# Patient Record
Sex: Female | Born: 1955 | Race: White | Hispanic: No | Smoking: Former smoker
Health system: Southern US, Community
[De-identification: ages and names within clinical notes are randomized; demographics above are authoritative.]

## PROBLEM LIST (undated history)

## (undated) DIAGNOSIS — C801 Malignant (primary) neoplasm, unspecified: Secondary | ICD-10-CM

## (undated) DIAGNOSIS — R000 Tachycardia, unspecified: Secondary | ICD-10-CM

## (undated) DIAGNOSIS — F32A Depression, unspecified: Secondary | ICD-10-CM

## (undated) DIAGNOSIS — F329 Major depressive disorder, single episode, unspecified: Secondary | ICD-10-CM

## (undated) DIAGNOSIS — G629 Polyneuropathy, unspecified: Secondary | ICD-10-CM

## (undated) DIAGNOSIS — K7460 Unspecified cirrhosis of liver: Secondary | ICD-10-CM

## (undated) DIAGNOSIS — I10 Essential (primary) hypertension: Secondary | ICD-10-CM

## (undated) DIAGNOSIS — I2510 Atherosclerotic heart disease of native coronary artery without angina pectoris: Secondary | ICD-10-CM

## (undated) DIAGNOSIS — D649 Anemia, unspecified: Secondary | ICD-10-CM

## (undated) DIAGNOSIS — E079 Disorder of thyroid, unspecified: Secondary | ICD-10-CM

## (undated) DIAGNOSIS — E119 Type 2 diabetes mellitus without complications: Secondary | ICD-10-CM

## (undated) DIAGNOSIS — E039 Hypothyroidism, unspecified: Secondary | ICD-10-CM

## (undated) HISTORY — PX: APPENDECTOMY: SHX54

## (undated) HISTORY — PX: ABDOMINAL HYSTERECTOMY: SHX81

---

## 2001-08-14 ENCOUNTER — Ambulatory Visit (HOSPITAL_COMMUNITY): Admission: RE | Admit: 2001-08-14 | Discharge: 2001-08-14 | Payer: Self-pay | Admitting: Internal Medicine

## 2001-10-26 ENCOUNTER — Encounter: Payer: Self-pay | Admitting: Obstetrics & Gynecology

## 2001-10-26 ENCOUNTER — Inpatient Hospital Stay (HOSPITAL_COMMUNITY): Admission: AD | Admit: 2001-10-26 | Discharge: 2001-10-31 | Payer: Self-pay | Admitting: Obstetrics & Gynecology

## 2002-02-19 ENCOUNTER — Encounter: Payer: Self-pay | Admitting: Emergency Medicine

## 2002-02-19 ENCOUNTER — Emergency Department (HOSPITAL_COMMUNITY): Admission: EM | Admit: 2002-02-19 | Discharge: 2002-02-19 | Payer: Self-pay | Admitting: Emergency Medicine

## 2002-07-12 ENCOUNTER — Ambulatory Visit (HOSPITAL_COMMUNITY): Admission: RE | Admit: 2002-07-12 | Discharge: 2002-07-12 | Payer: Self-pay | Admitting: Family Medicine

## 2002-07-12 ENCOUNTER — Encounter: Payer: Self-pay | Admitting: Family Medicine

## 2002-07-14 ENCOUNTER — Emergency Department (HOSPITAL_COMMUNITY): Admission: EM | Admit: 2002-07-14 | Discharge: 2002-07-14 | Payer: Self-pay | Admitting: Emergency Medicine

## 2002-07-14 ENCOUNTER — Encounter: Payer: Self-pay | Admitting: Emergency Medicine

## 2002-08-30 ENCOUNTER — Encounter: Payer: Self-pay | Admitting: Family Medicine

## 2002-08-30 ENCOUNTER — Ambulatory Visit (HOSPITAL_COMMUNITY): Admission: RE | Admit: 2002-08-30 | Discharge: 2002-08-30 | Payer: Self-pay | Admitting: Family Medicine

## 2002-10-28 ENCOUNTER — Encounter: Payer: Self-pay | Admitting: Family Medicine

## 2002-10-28 ENCOUNTER — Ambulatory Visit (HOSPITAL_COMMUNITY): Admission: RE | Admit: 2002-10-28 | Discharge: 2002-10-28 | Payer: Self-pay | Admitting: Family Medicine

## 2002-12-27 ENCOUNTER — Ambulatory Visit (HOSPITAL_COMMUNITY): Admission: RE | Admit: 2002-12-27 | Discharge: 2002-12-27 | Payer: Self-pay | Admitting: Family Medicine

## 2002-12-27 ENCOUNTER — Encounter: Payer: Self-pay | Admitting: Family Medicine

## 2003-02-12 ENCOUNTER — Emergency Department (HOSPITAL_COMMUNITY): Admission: EM | Admit: 2003-02-12 | Discharge: 2003-02-12 | Payer: Self-pay | Admitting: Emergency Medicine

## 2003-03-16 ENCOUNTER — Emergency Department (HOSPITAL_COMMUNITY): Admission: EM | Admit: 2003-03-16 | Discharge: 2003-03-16 | Payer: Self-pay | Admitting: Emergency Medicine

## 2003-07-12 ENCOUNTER — Ambulatory Visit (HOSPITAL_COMMUNITY): Admission: RE | Admit: 2003-07-12 | Discharge: 2003-07-12 | Payer: Self-pay | Admitting: Family Medicine

## 2003-07-15 ENCOUNTER — Ambulatory Visit (HOSPITAL_COMMUNITY): Admission: RE | Admit: 2003-07-15 | Discharge: 2003-07-15 | Payer: Self-pay | Admitting: Family Medicine

## 2004-06-21 ENCOUNTER — Ambulatory Visit: Payer: Self-pay | Admitting: Family Medicine

## 2004-11-07 ENCOUNTER — Ambulatory Visit: Payer: Self-pay | Admitting: Family Medicine

## 2004-11-13 ENCOUNTER — Ambulatory Visit (HOSPITAL_COMMUNITY): Admission: RE | Admit: 2004-11-13 | Discharge: 2004-11-13 | Payer: Self-pay | Admitting: Family Medicine

## 2005-01-17 ENCOUNTER — Ambulatory Visit (HOSPITAL_COMMUNITY): Admission: RE | Admit: 2005-01-17 | Discharge: 2005-01-17 | Payer: Self-pay | Admitting: Family Medicine

## 2005-01-23 ENCOUNTER — Ambulatory Visit: Payer: Self-pay | Admitting: Family Medicine

## 2005-01-30 ENCOUNTER — Ambulatory Visit (HOSPITAL_COMMUNITY): Admission: RE | Admit: 2005-01-30 | Discharge: 2005-01-30 | Payer: Self-pay | Admitting: Family Medicine

## 2005-03-28 ENCOUNTER — Ambulatory Visit (HOSPITAL_COMMUNITY): Admission: RE | Admit: 2005-03-28 | Discharge: 2005-03-28 | Payer: Self-pay | Admitting: Family Medicine

## 2005-04-16 ENCOUNTER — Ambulatory Visit: Payer: Self-pay | Admitting: Family Medicine

## 2005-06-03 ENCOUNTER — Ambulatory Visit: Payer: Self-pay | Admitting: Internal Medicine

## 2006-04-14 ENCOUNTER — Ambulatory Visit: Payer: Self-pay | Admitting: Internal Medicine

## 2006-05-14 ENCOUNTER — Encounter (INDEPENDENT_AMBULATORY_CARE_PROVIDER_SITE_OTHER): Payer: Self-pay | Admitting: Specialist

## 2006-05-14 ENCOUNTER — Ambulatory Visit (HOSPITAL_COMMUNITY): Admission: RE | Admit: 2006-05-14 | Discharge: 2006-05-14 | Payer: Self-pay | Admitting: Internal Medicine

## 2006-05-28 ENCOUNTER — Ambulatory Visit: Payer: Self-pay | Admitting: Internal Medicine

## 2006-06-11 ENCOUNTER — Emergency Department (HOSPITAL_COMMUNITY): Admission: EM | Admit: 2006-06-11 | Discharge: 2006-06-11 | Payer: Self-pay | Admitting: Emergency Medicine

## 2006-08-28 ENCOUNTER — Ambulatory Visit: Payer: Self-pay | Admitting: Internal Medicine

## 2006-10-07 ENCOUNTER — Ambulatory Visit (HOSPITAL_COMMUNITY): Admission: RE | Admit: 2006-10-07 | Discharge: 2006-10-07 | Payer: Self-pay | Admitting: Internal Medicine

## 2006-10-07 ENCOUNTER — Ambulatory Visit: Payer: Self-pay | Admitting: Internal Medicine

## 2007-10-23 ENCOUNTER — Emergency Department (HOSPITAL_COMMUNITY): Admission: EM | Admit: 2007-10-23 | Discharge: 2007-10-23 | Payer: Self-pay | Admitting: Emergency Medicine

## 2007-10-25 ENCOUNTER — Emergency Department (HOSPITAL_COMMUNITY): Admission: EM | Admit: 2007-10-25 | Discharge: 2007-10-25 | Payer: Self-pay | Admitting: Emergency Medicine

## 2008-04-09 ENCOUNTER — Emergency Department (HOSPITAL_COMMUNITY): Admission: EM | Admit: 2008-04-09 | Discharge: 2008-04-09 | Payer: Self-pay | Admitting: Emergency Medicine

## 2008-04-13 ENCOUNTER — Ambulatory Visit (HOSPITAL_COMMUNITY): Admission: RE | Admit: 2008-04-13 | Discharge: 2008-04-13 | Payer: Self-pay | Admitting: General Surgery

## 2008-05-18 ENCOUNTER — Ambulatory Visit (HOSPITAL_COMMUNITY): Admission: RE | Admit: 2008-05-18 | Discharge: 2008-05-18 | Payer: Self-pay | Admitting: Internal Medicine

## 2008-05-24 ENCOUNTER — Encounter (HOSPITAL_COMMUNITY): Admission: RE | Admit: 2008-05-24 | Discharge: 2008-06-23 | Payer: Self-pay | Admitting: Oncology

## 2008-05-31 ENCOUNTER — Ambulatory Visit (HOSPITAL_COMMUNITY): Payer: Self-pay | Admitting: Physician Assistant

## 2008-05-31 ENCOUNTER — Encounter (HOSPITAL_COMMUNITY): Admission: RE | Admit: 2008-05-31 | Discharge: 2008-06-30 | Payer: Self-pay | Admitting: Physician Assistant

## 2008-06-21 ENCOUNTER — Ambulatory Visit (HOSPITAL_COMMUNITY): Payer: Self-pay | Admitting: Oncology

## 2008-06-27 ENCOUNTER — Inpatient Hospital Stay (HOSPITAL_COMMUNITY): Admission: EM | Admit: 2008-06-27 | Discharge: 2008-06-29 | Payer: Self-pay | Admitting: Emergency Medicine

## 2008-06-28 ENCOUNTER — Ambulatory Visit: Payer: Self-pay | Admitting: Oncology

## 2008-06-29 ENCOUNTER — Ambulatory Visit: Payer: Self-pay | Admitting: Internal Medicine

## 2008-07-07 ENCOUNTER — Ambulatory Visit (HOSPITAL_COMMUNITY): Admission: RE | Admit: 2008-07-07 | Discharge: 2008-07-07 | Payer: Self-pay | Admitting: Internal Medicine

## 2008-07-25 ENCOUNTER — Ambulatory Visit (HOSPITAL_COMMUNITY): Admission: RE | Admit: 2008-07-25 | Discharge: 2008-07-25 | Payer: Self-pay | Admitting: General Surgery

## 2008-08-15 ENCOUNTER — Ambulatory Visit (HOSPITAL_COMMUNITY): Payer: Self-pay | Admitting: Oncology

## 2008-08-16 ENCOUNTER — Encounter (HOSPITAL_COMMUNITY): Admission: RE | Admit: 2008-08-16 | Discharge: 2008-09-15 | Payer: Self-pay | Admitting: Oncology

## 2008-10-26 ENCOUNTER — Encounter: Payer: Self-pay | Admitting: Internal Medicine

## 2009-05-08 ENCOUNTER — Encounter: Payer: Self-pay | Admitting: Internal Medicine

## 2009-06-20 ENCOUNTER — Encounter: Payer: Self-pay | Admitting: Family Medicine

## 2009-06-23 IMAGING — CT CT PELVIS W/ CM
1 of 3 series · 13 of 32 positions shown, 18 images · IV contrast (agent unspecified)
Comparison: 01/30/2005.

CT ABDOMEN

CLINICAL DATA: 52-year-old female with cirrhosis, tachycardia,
anemia.  History of cervical cancer, hysterectomy and appendectomy.

CT ABDOMEN AND PELVIS WITH CONTRAST
TECHNIQUE: Multidetector CT imaging of the abdomen and pelvis was
performed using the standard protocol following bolus
administration of intravenous contrast.
Contrast: 100 ml Imnipaque-SHH.

[Series 2: abd_pel 5.0 b40f · axial · 0.89mm/px · z∈[-496,-80]mm · 13 of 95 slices shown, 18 images]
[im 6/95  soft-tissue]
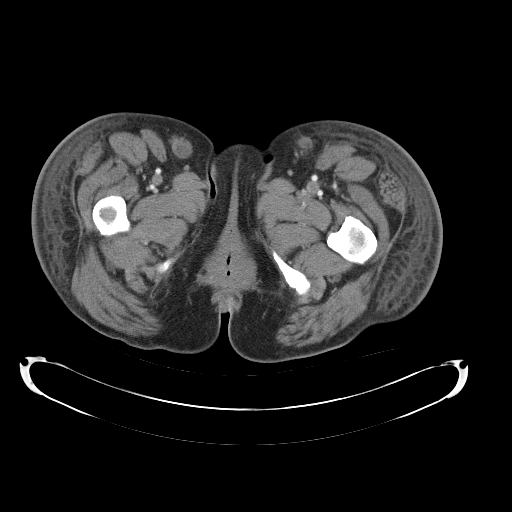
[im 6/95  bone]
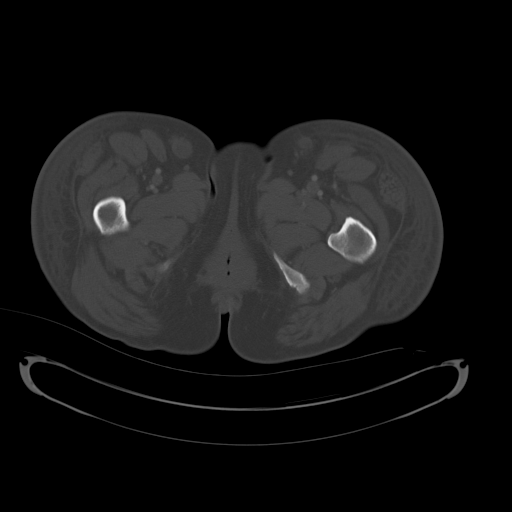
[im 17/95  soft-tissue]
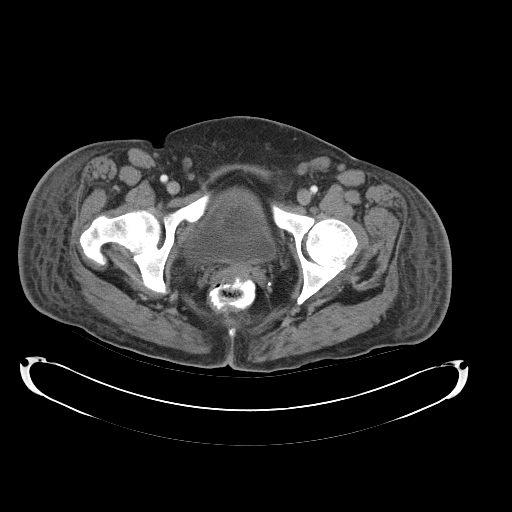
[im 23/95  soft-tissue]
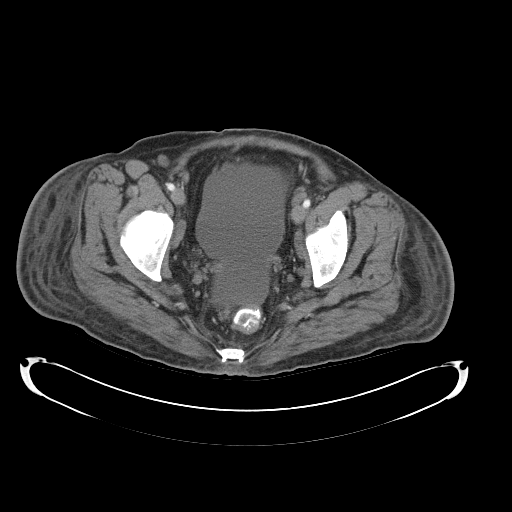
[im 28/95  soft-tissue]
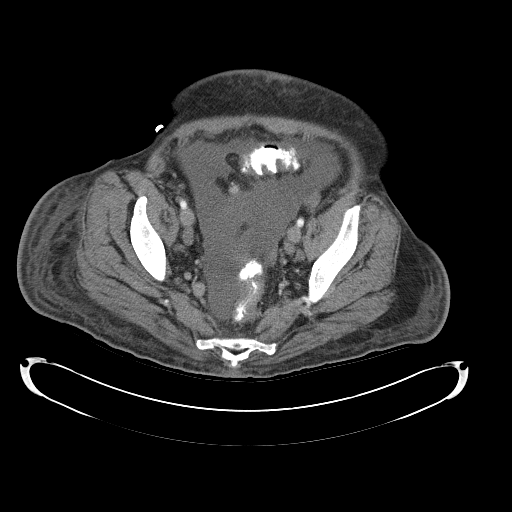
[im 39/95  soft-tissue]
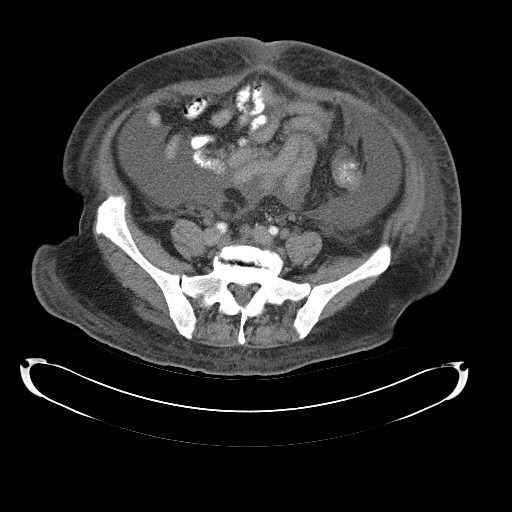
[im 45/95  soft-tissue]
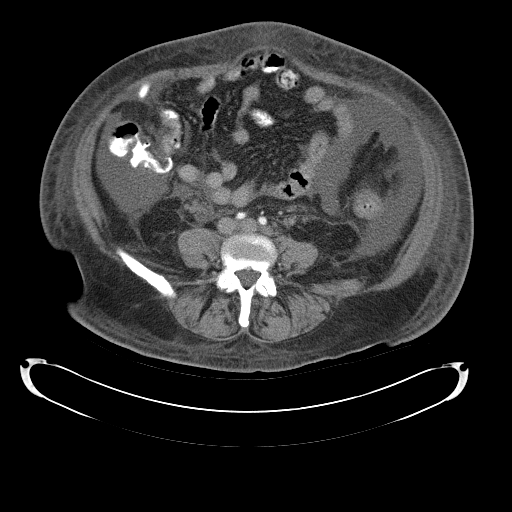
[im 50/95  soft-tissue]
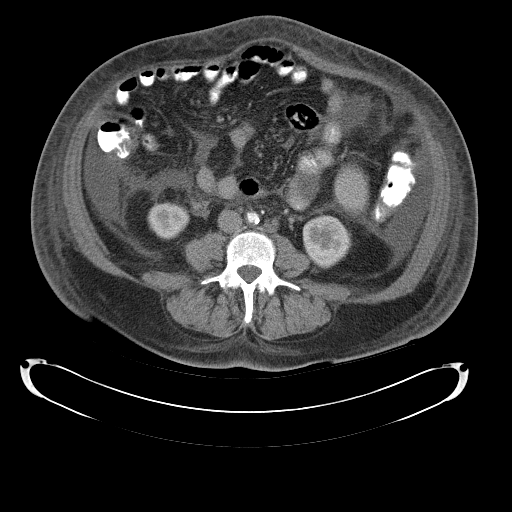
[im 61/95  soft-tissue]
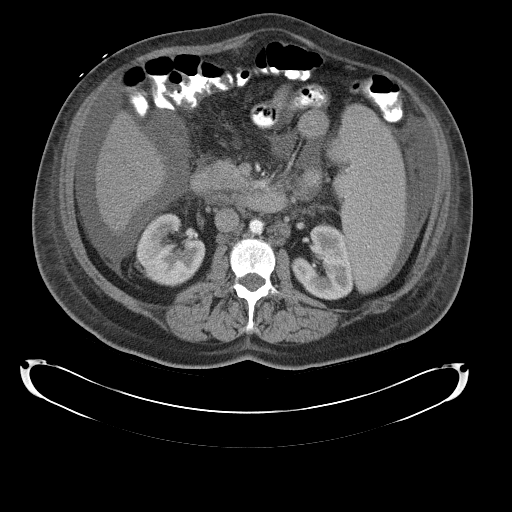
[im 67/95  soft-tissue]
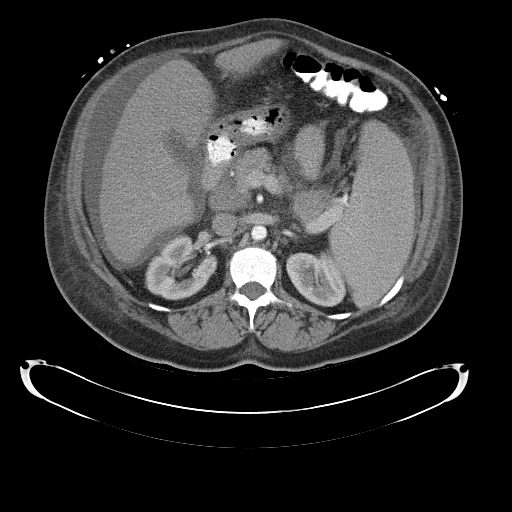
[im 67/95  bone]
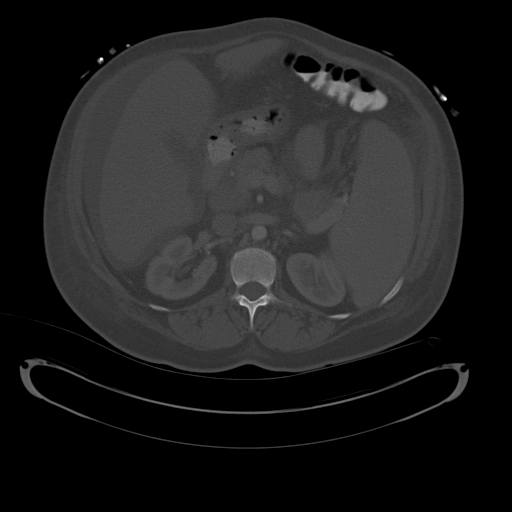
[im 72/95  soft-tissue]
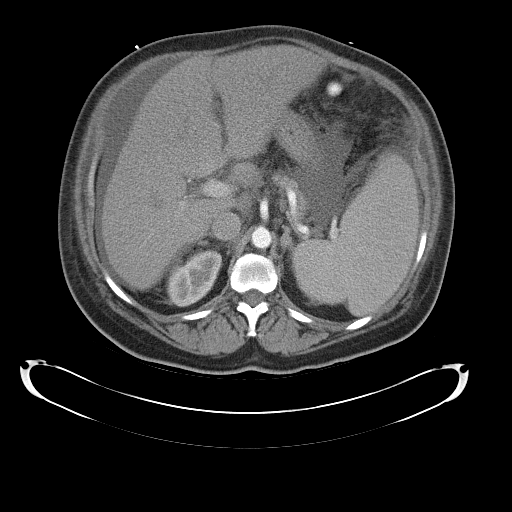
[im 72/95  lung]
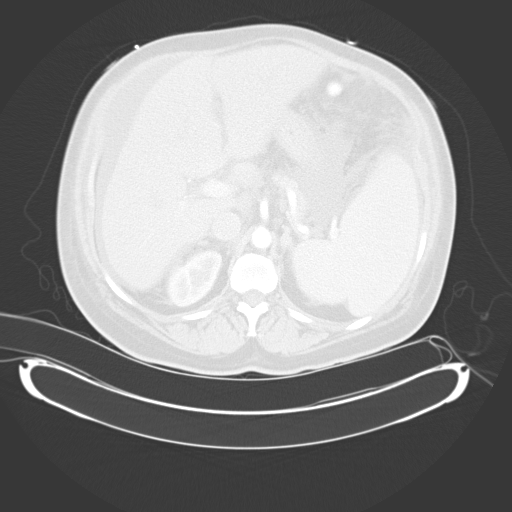
[im 78/95  lung]
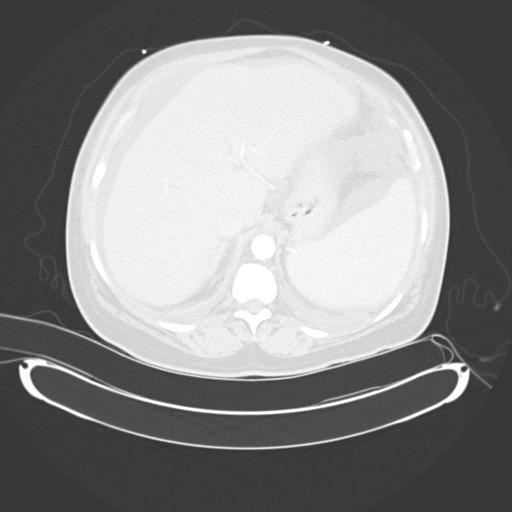
[im 83/95  soft-tissue]
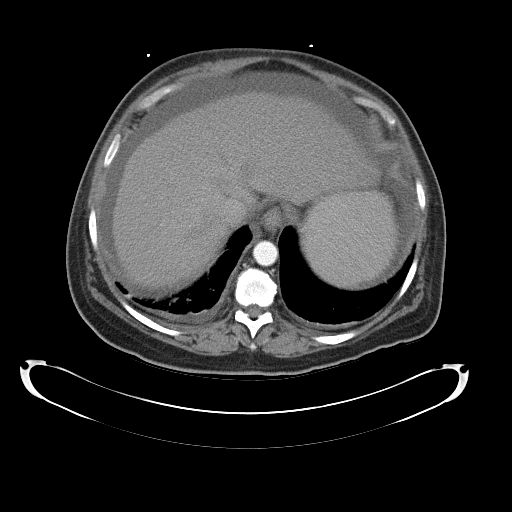
[im 83/95  lung]
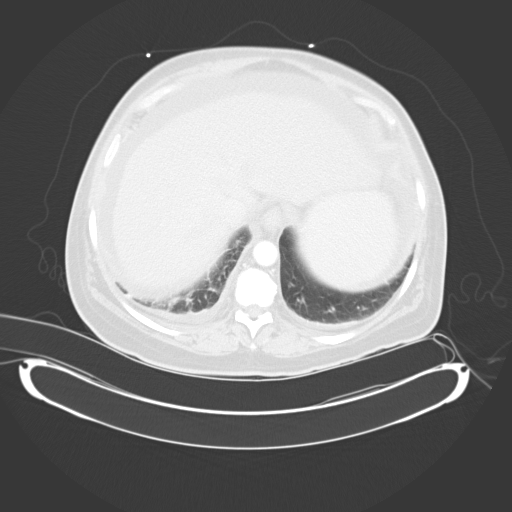
[im 89/95  soft-tissue]
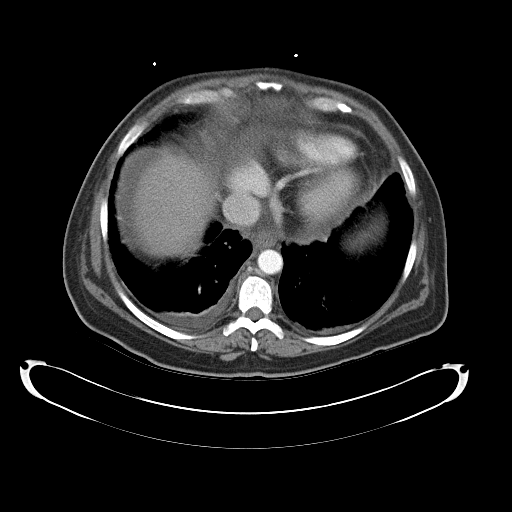
[im 89/95  lung]
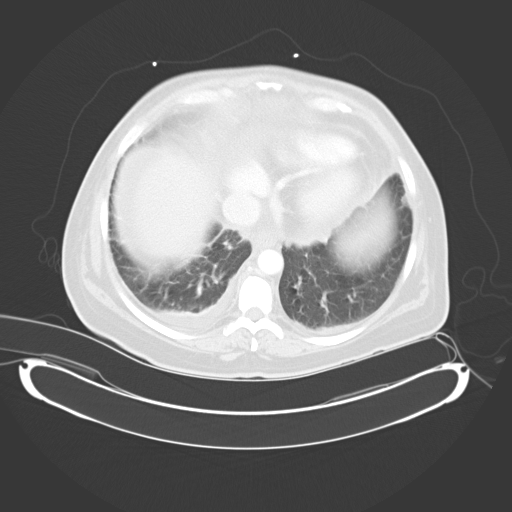

[13 of 32 positions shown; findings below may reference images not displayed]

FINDINGS: Small layering right pleural effusion and trace layering
left pleural effusion.  Small pericardial effusion is new measuring
up to 12 mm in thickness.  Atelectasis at the lung bases.  Lumbar
disc degeneration, worst at L5-S1. No acute osseous abnormality
identified.

Moderate volume ascites, chiefly in the lower abdomen and pelvis
with a smaller perihepatic and perisplenic fluid.  Mild periportal
edema.  No discrete liver parenchymal lesion.  The gallbladder,
pancreas, adrenal glands and kidneys are within normal limits.
Splenomegaly measures 18 cm.  Main portal vein is patent.  Major
abdominal arterial structures are patent, there is infrarenal and
iliac atherosclerosis.  The stomach, duodenum and proximal small
bowel loops are within normal limits.  No dilated bowel, and no
bowel obstruction.  The colon is intermittently thick-walled,
primarily the distal colon.  Interval increased porta hepatis,
gastrohepatic ligament and retroperitoneal lymphadenopathy.  The
largest node is a portacaval node measuring 18 mm in short axis.
Prominent superior diaphragmatic nodes also increased measuring up
to 8 mm in short axis.  Adenopathy continues in the pelvis, see
below.
IMPRESSION: 1.  Stigmata of cirrhosis and portal venous hypertension with
interval increased significantly increased ascites.  New small (12
mm in thickness) pericardial effusion.  Small pleural effusions.
Splenomegaly.
2.  Increased abdominal and retroperitoneal lymphadenopathy is
nonspecific and can be seen in the setting of chronic hepatitis,
metastatic disease, or lymphoproliferative disorder.  The largest
nodes are in the inguinal regions (see below).
3.  See pelvis findings below.

CT PELVIS
FINDINGS: Moderate volume pelvic ascites.  Bladder is
decompressed.  Uterus and adnexa are surgically absent.  The distal
colon is thick-walled which could be reactive or reflect colitis.
Major pelvic arterial structures are patent. No acute osseous
abnormality identified.

There is retroperitoneal, pelvic sidewall and inguinal
lymphadenopathy.  The largest nodes are in the inguinal nodes
measuring 18-21 cm bilaterally (the largest is in the left inferior
inguinal region.  Subcutaneous fat stranding in the pelvis and
lower extremities is compatible with anasarca.
IMPRESSION: 1.  Pelvic and inguinal lymphadenopathy.  The largest node is a 2
cm inferior left inguinal node which would be amendable to
percutaneous biopsy as clinically indicated.
2.  Moderate volume pelvic ascites. Anasarca.
3.  Thick-walled distal colon could be reactive to the presence of
ascites or reflect colitis. Clinical correlation recommended.

## 2009-07-31 ENCOUNTER — Encounter: Payer: Self-pay | Admitting: Internal Medicine

## 2009-09-15 ENCOUNTER — Emergency Department (HOSPITAL_COMMUNITY): Admission: EM | Admit: 2009-09-15 | Discharge: 2009-09-15 | Payer: Self-pay | Admitting: Emergency Medicine

## 2009-12-12 ENCOUNTER — Encounter: Payer: Self-pay | Admitting: Internal Medicine

## 2010-05-07 ENCOUNTER — Encounter: Payer: Self-pay | Admitting: Internal Medicine

## 2010-07-21 ENCOUNTER — Encounter: Payer: Self-pay | Admitting: Family Medicine

## 2010-07-22 ENCOUNTER — Encounter: Payer: Self-pay | Admitting: Internal Medicine

## 2010-07-22 ENCOUNTER — Encounter: Payer: Self-pay | Admitting: Family Medicine

## 2010-07-31 NOTE — Letter (Signed)
Summary: External Other  External Other   Imported By: Peggyann Shoals 12/12/2009 10:35:30  _____________________________________________________________________  External Attachment:    Type:   Image     Comment:   External Document

## 2010-07-31 NOTE — Letter (Signed)
Summary: RECORDS FROM [facility]  RECORDS FROM [facility]   Imported By: Diana Eves 07/31/2009 16:48:32  _____________________________________________________________________  External Attachment:    Type:   Image     Comment:   External Document

## 2010-07-31 NOTE — Letter (Signed)
Summary: [facility]  [facility] NOTE   Imported By: Rexene Alberts 05/07/2010 10:44:03  _____________________________________________________________________  External Attachment:    Type:   Image     Comment:   External Document

## 2010-09-04 ENCOUNTER — Emergency Department (HOSPITAL_COMMUNITY): Payer: Managed Care, Other (non HMO)

## 2010-09-04 ENCOUNTER — Inpatient Hospital Stay (HOSPITAL_COMMUNITY)
Admission: EM | Admit: 2010-09-04 | Discharge: 2010-09-06 | DRG: 682 | Disposition: A | Discharge status: Home / Self Care | Payer: Managed Care, Other (non HMO) | Attending: Internal Medicine | Admitting: Internal Medicine

## 2010-09-04 DIAGNOSIS — E860 Dehydration: Secondary | ICD-10-CM | POA: Diagnosis present

## 2010-09-04 DIAGNOSIS — K7689 Other specified diseases of liver: Secondary | ICD-10-CM | POA: Diagnosis present

## 2010-09-04 DIAGNOSIS — N179 Acute kidney failure, unspecified: Principal | ICD-10-CM | POA: Diagnosis present

## 2010-09-04 DIAGNOSIS — E876 Hypokalemia: Secondary | ICD-10-CM | POA: Diagnosis present

## 2010-09-04 DIAGNOSIS — K7460 Unspecified cirrhosis of liver: Secondary | ICD-10-CM | POA: Diagnosis present

## 2010-09-04 DIAGNOSIS — M549 Dorsalgia, unspecified: Secondary | ICD-10-CM | POA: Diagnosis present

## 2010-09-04 DIAGNOSIS — J189 Pneumonia, unspecified organism: Secondary | ICD-10-CM | POA: Diagnosis present

## 2010-09-04 DIAGNOSIS — G8929 Other chronic pain: Secondary | ICD-10-CM | POA: Diagnosis present

## 2010-09-04 LAB — URINALYSIS, ROUTINE W REFLEX MICROSCOPIC
Bilirubin Urine: NEGATIVE
Glucose, UA: NEGATIVE mg/dL
Nitrite: NEGATIVE
Protein, ur: 100 mg/dL — AB
pH: 5 (ref 5.0–8.0)

## 2010-09-04 LAB — DIFFERENTIAL
Eosinophils Absolute: 0 10*3/uL (ref 0.0–0.7)
Eosinophils Relative: 0 % (ref 0–5)
Neutrophils Relative %: 92 % — ABNORMAL HIGH (ref 43–77)

## 2010-09-04 LAB — CBC
HCT: 25.8 % — ABNORMAL LOW (ref 36.0–46.0)
Hemoglobin: 9.4 g/dL — ABNORMAL LOW (ref 12.0–15.0)
MCH: 32.5 pg (ref 26.0–34.0)
MCHC: 36.4 g/dL — ABNORMAL HIGH (ref 30.0–36.0)
MCV: 89.3 fL (ref 78.0–100.0)
Platelets: 127 10*3/uL — ABNORMAL LOW (ref 150–400)
RBC: 2.89 MIL/uL — ABNORMAL LOW (ref 3.87–5.11)
RDW: 13.7 % (ref 11.5–15.5)

## 2010-09-04 LAB — PROTIME-INR: INR: 1.72 — ABNORMAL HIGH (ref 0.00–1.49)

## 2010-09-04 LAB — URINE MICROSCOPIC-ADD ON

## 2010-09-04 LAB — COMPREHENSIVE METABOLIC PANEL
ALT: 10 U/L (ref 0–35)
Alkaline Phosphatase: 100 U/L (ref 39–117)
BUN: 43 mg/dL — ABNORMAL HIGH (ref 6–23)
Calcium: 7.5 mg/dL — ABNORMAL LOW (ref 8.4–10.5)
Chloride: 97 mEq/L (ref 96–112)
Glucose, Bld: 228 mg/dL — ABNORMAL HIGH (ref 70–99)
Potassium: 2.9 mEq/L — ABNORMAL LOW (ref 3.5–5.1)
Total Bilirubin: 0.9 mg/dL (ref 0.3–1.2)

## 2010-09-04 LAB — APTT: aPTT: 46 seconds — ABNORMAL HIGH (ref 24–37)

## 2010-09-05 ENCOUNTER — Inpatient Hospital Stay (HOSPITAL_COMMUNITY): Payer: Managed Care, Other (non HMO)

## 2010-09-05 LAB — COMPREHENSIVE METABOLIC PANEL
ALT: 9 U/L (ref 0–35)
AST: 10 U/L (ref 0–37)
Alkaline Phosphatase: 83 U/L (ref 39–117)
BUN: 37 mg/dL — ABNORMAL HIGH (ref 6–23)
GFR calc Af Amer: 27 mL/min — ABNORMAL LOW (ref >60)
GFR calc non Af Amer: 22 mL/min — ABNORMAL LOW (ref >60)
Potassium: 3.2 mEq/L — ABNORMAL LOW (ref 3.5–5.1)

## 2010-09-05 LAB — DIFFERENTIAL
Basophils Absolute: 0 10*3/uL (ref 0.0–0.1)
Basophils Relative: 0 % (ref 0–1)
Eosinophils Relative: 0 % (ref 0–5)
Lymphocytes Relative: 8 % — ABNORMAL LOW (ref 12–46)
Monocytes Relative: 7 % (ref 3–12)
Neutrophils Relative %: 85 % — ABNORMAL HIGH (ref 43–77)

## 2010-09-05 LAB — URINE CULTURE
Colony Count: NO GROWTH
Culture  Setup Time: 201203070132
Culture: NO GROWTH

## 2010-09-05 LAB — MAGNESIUM: Magnesium: 0.9 mg/dL — CL (ref 1.5–2.5)

## 2010-09-05 LAB — CBC
HCT: 22.1 % — ABNORMAL LOW (ref 36.0–46.0)
MCHC: 36.7 g/dL — ABNORMAL HIGH (ref 30.0–36.0)
MCV: 89.8 fL (ref 78.0–100.0)
Platelets: 105 10*3/uL — ABNORMAL LOW (ref 150–400)
RBC: 2.46 MIL/uL — ABNORMAL LOW (ref 3.87–5.11)
WBC: 8.5 10*3/uL (ref 4.0–10.5)

## 2010-09-05 LAB — PHOSPHORUS: Phosphorus: 3 mg/dL (ref 2.3–4.6)

## 2010-09-06 ENCOUNTER — Inpatient Hospital Stay (HOSPITAL_COMMUNITY): Payer: Managed Care, Other (non HMO)

## 2010-09-06 LAB — DIFFERENTIAL
Basophils Absolute: 0 10*3/uL (ref 0.0–0.1)
Eosinophils Absolute: 0.1 10*3/uL (ref 0.0–0.7)
Eosinophils Relative: 1 % (ref 0–5)
Lymphocytes Relative: 11 % — ABNORMAL LOW (ref 12–46)
Monocytes Absolute: 0.4 10*3/uL (ref 0.1–1.0)

## 2010-09-06 LAB — COMPREHENSIVE METABOLIC PANEL
ALT: 9 U/L (ref 0–35)
Albumin: 2.6 g/dL — ABNORMAL LOW (ref 3.5–5.2)
BUN: 21 mg/dL (ref 6–23)
Calcium: 7.7 mg/dL — ABNORMAL LOW (ref 8.4–10.5)
Glucose, Bld: 129 mg/dL — ABNORMAL HIGH (ref 70–99)
Sodium: 141 mEq/L (ref 135–145)
Total Protein: 6.2 g/dL (ref 6.0–8.3)

## 2010-09-06 LAB — PROTIME-INR
INR: 1.95 — ABNORMAL HIGH (ref 0.00–1.49)
Prothrombin Time: 22.4 seconds — ABNORMAL HIGH (ref 11.6–15.2)

## 2010-09-06 LAB — GLUCOSE, CAPILLARY: Glucose-Capillary: 207 mg/dL — ABNORMAL HIGH (ref 70–99)

## 2010-09-06 LAB — CBC
HCT: 21.1 % — ABNORMAL LOW (ref 36.0–46.0)
MCHC: 36 g/dL (ref 30.0–36.0)
Platelets: 105 10*3/uL — ABNORMAL LOW (ref 150–400)
RDW: 14 % (ref 11.5–15.5)

## 2010-09-07 NOTE — Discharge Summary (Signed)
NAME:  Jessica Dunlap, Jessica Dunlap                   ACCOUNT NO.:  [id number]  MEDICAL RECORD NO.:  [id number]           PATIENT TYPE:  I  LOCATION:  A326                          FACILITY:  [facility]  PHYSICIAN:  Wilson Singer, M.D.DATE OF BIRTH:  [birth year]  DATE OF ADMISSION:  09/04/2010 DATE OF DISCHARGE:  03/08/2012LH                         DISCHARGE SUMMARY-REFERRING   PRIMARY CARE PHYSICIAN:  Madelin Rear. Sherwood Gambler, MD  FINAL DISCHARGE DIAGNOSES: 1. Left lower lobe pneumonia. 2. Acute renal failure and dehydration, creatinine improved from 2.98     to 1.72 on discharge. 3. 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 4. Chronic back pain.  MEDICATIONS ON DISCHARGE: 1. Zithromax 500 mg daily for 5 days. 2. Ceftin 500 mg b.i.d. for 1 week.  HOME MEDICATIONS:  Continue some home medications, which include; 1. Vitamin B12 2500 mcg daily. 2. Metoprolol 50 mg b.i.d. 3. Diltiazem 120 mg daily. 4. Paroxetine 60 mg at bedtime. 5. Folic acid 1 mg daily. 6. Vitamin A 8000 International Units 1 tablet daily. 7. Garlic extract 4098 mg daily. 8. Morphine sulfate, controlled release 60 mg b.i.d. 9. Hydromorphone 8 mg every 3 hours p.r.n. 10.At the present time, I would hold spironolactone, which was dosed     at 25 mg 1-1/2 tablet daily and also furosemide 80 mg twice daily. 11.Levothyroxine 200 mcg daily.  CONDITION ON DISCHARGE:  Stable.  HISTORY:  This very pleasant 55 year old lady was admitted with a 6-week history of poor appetite and 3-4 days history of nausea and vomiting. Please see initial history and physical examination done by Dr. Gerri Lins.  HOSPITAL PROGRESS:  The patient when she was admitted was found to have a left lower lobe pneumonia on chest x-ray, although she did not seem to have symptoms of this, but she was clearly clinically dehydrated with a creatinine 2.98.  The patient was therefore rehydrated with intravenous fluids and put  on intravenous antibiotics.  She did well with this and her appetite and general well being improved over the next day or 2. Today, she is feeling extremely well and has got good appetite.  There was no swelling in her legs or in her abdomen indicative of any impending ascites.  PHYSICAL EXAMINATION:  VITAL SIGNS:  Temperature 98.5, blood pressure 121/71, pulse 65, saturation 95% on room air. HEART:  Sounds are present and normal without murmurs. CHEST:  Lung field shows crackles in the left lower zone. NEUROLOGIC:  She is alert and oriented without any focal neurologic signs.  LABORATORY DATA:  Investigations today show sodium of 141, potassium reduced to 2.7, bicarbonate 18, BUN 21, creatinine 1.72.  Hemoglobin 7.6, white blood cell count 5.6, platelets 105.  INR 1.95.  Urine and blood cultures have been negative.  Also a ultrasound of the kidneys was done in view of the acute renal failure and this shows that there is increased renal cortical echogenicity compatible with medical renal disease and no evidence of obstructive hydronephrosis.  DISPOSITION:  The patient really is stable to be discharged home only on the understanding that she follow up  with Dr. Sherwood Gambler tomorrow, so that he can check her renal function again.  She has been given a total of further 80 mEq of potassium today prior to discharge.  This should normalize her potassium.  Ongoing intravenous fluids up to the time of discharge also should continue her hydration process and she also is drinking plenty of water.  Further recommendations include a repeat chest x-ray in 4-6 weeks to make sure there are resolution of her pneumonia, otherwise she may well need a chest scan to make sure there is no underlying malignancy.  I would also recommend cautious use of morphine and hydromorphone as 2 opioids on the same patient.     Wilson Singer, M.D.     NCG/MEDQ  D:  09/06/2010  T:  09/06/2010  Job:  [id number]  cc:    Madelin Rear. Sherwood Gambler, MD Fax: [phone]  Electronically Signed by Lilly Cove M.D. on 09/06/2010 02:22:37 PM

## 2010-09-09 LAB — CULTURE, BLOOD (ROUTINE X 2): Culture: NO GROWTH

## 2010-09-14 NOTE — H&P (Signed)
NAME:  HECTOR, VENNE                   ACCOUNT NO.:  [id number]  MEDICAL RECORD NO.:  [id number]           PATIENT TYPE:  I  LOCATION:  A326                          FACILITY:  [facility]  PHYSICIAN:  Alauna Hayden, DO         DATE OF BIRTH:  1955-12-18  DATE OF ADMISSION:  09/04/2010 DATE OF DISCHARGE:  LH                             HISTORY & PHYSICAL   CHIEF COMPLAINT:  Nausea and vomiting.  HISTORY OF PRESENT ILLNESS:  The patient is a very pleasant 55 year old female who is followed at [facility] for hepatic cirrhosis stage III.  The patient states that for 6 weeks or more, she has had a greatly decreased appetite.  She does not know if she has lost any weight. Since Sunday, she has had nausea and vomiting, which today has become intractable.  She has been unable to keep anything down.  She denies abdominal pain.  She has had fevers.  She has had chills.  She denies cough, shortness of breath, or wheezing.  PAST MEDICAL HISTORY:  Significant for psoriasis with splenomegaly for which she is followed at [facility].  She also had ascites, anemia, depression, hypertension, history of MRSA, Graves disease, hypothyroidism, hypertension, and arrhythmia.  PAST SURGICAL HISTORY:  Appendectomy, total abdominal hysterectomy, and liver biopsy.  MEDICATIONS:  Unknown.  The family did not bring them in.  I have discussed this with the husband.  He will bring the list of the patient's medications, and I will go over those with nursing to see what we will continue.  ALLERGIES:  NKDA.  REVIEW OF SYSTEMS:  CONSTITUTIONAL:  Positive for fever, positive for chills, positive for weakness, positive for malaise.  CNS:  No headaches, no seizures, no limb weakness.  ENT:  No nasal congestion, ectropion, or coryza.  CARDIOVASCULAR:  No chest pain, no palpitations, no orthopnea.  RESPIRATORY:  No cough, no shortness of breath, no wheezing.  GASTROINTESTINAL:  Positive for nausea, positive  for vomiting, negative for constipation, negative for diarrhea. GENITOURINARY:  No dysuria, no hematuria, no urinary frequency.  RENAL: No flank pain.  No swelling.  No pruritus.  SKIN:  No rashes or sores. No lesions.  HEMATOLOGIC:  No easy bruising, no purpura, no clots. LYMPHS:  No lymphadenopathy, painful lymph nodes or specific limb swelling. PSYCHIATRIC:  No anxiety, no depression, no insomnia.  SOCIAL HISTORY:  No alcohol, no recreational drug use.  The patient quit smoking 3 years ago.  She did have roughly 40-50 pack year smoking history.  FAMILY MEDICAL HISTORY:  Negative for liver disease or cancer.  PHYSICAL EXAMINATION:  VITALS:  Temperature was 102.8, pulse 75, respiratory rate 16, she is satting 95% on room air.  Her blood pressure has just dropped to 90/57. GENERAL:  The patient is awake, alert, and oriented x3 and able to give a fair history.  She does appear acutely ill.  She is not short of breath.  She is able to give fair history. EYES:  Positive for proptosis bilaterally.  Pupils equal, round, and react to light and accommodation.  Extraocular movements are intact. Sclerae are nonicteric, noninjected.  Mouth, oral mucosa dry.  No lesions or sores.  Pharynx clear.  No erythema. NECK:  Negative for JVD.  Negative for thyromegaly.  Next for lymphadenopathy. HEART:  Regular rate and rhythm at 80 beats per minute without murmur, ectopy, or gallops.  No lateral PMI.  No thrills. LUNGS:  Clear to auscultation bilaterally without wheezes, rales, or rhonchi.  No increase work of breathing.  No tactile fremitus. ABDOMEN:  Soft, nontender, and nondistended.  Positive bowel sounds. Positive for small fluid wave.  Positive for splenomegaly.  Hernias not palpated. EXTREMITIES:  Negative for cyanosis, clubbing, or edema.  The patient has positive dorsalis pedis and popliteal pulses bilaterally.  No carotid bruits bilaterally. NEUROLOGIC:  Cranial nerves II-XII grossly  intact.  Motor and sensory intact.  LABORATORY DATA:  WBC is 15.3, hemoglobin 9.4, hematocrit 25.8, platelets are 127.  Sodium 130, potassium 2.9, chloride 97, CO2 of 19, BUN 43, creatinine 2.98.  T bili 0.9, alk phos 100, AST 11, ALT 12, total protein is 8, calcium is 7.5.  Portable chest shows a left lower lobe pneumonia.  Urinalysis shows an UTI.  The patient also has a lipase of 26, amylase 21.  ASSESSMENT: 1. Left lower lobe pneumonia, most likely community-acquired     pneumonia, although the patient is relatively immunosuppressed due     to her liver disease. 2. Sepsis or SIRS with acute renal failure, fever, hypotension,     leukocytosis. 3. Acute renal failure.  DIFFERENTIAL DIAGNOSES: 1. Sepsis. 2. Volume depletion secondary to nausea and vomiting. 3. Over diuresis. 4. Hepatorenal syndrome. 5. Hypokalemia. 6. Hypotension which we will treat, we have given the patient a fluid     challenge.  She will also receive IV fluids. 7. Ascites. 8. Cirrhosis of the liver, stage III.  PLAN: 1. The patient will be admitted. 2. She will be given IV antibiotics and IV fluids. 3. We will supplement her potassium. 4. Monitor her electrolytes carefully. 5. Monitor her volume status carefully as well as her creatinine. 6. She will not receive a chemoprophylaxis for DVT due to her other     anticoagulation from her chronic liver     disease. 7. She will be given GI prophylaxis and we await list of the patient's     home meds.  Please note that I have spent 68 minutes on this critical care admission.                                           ______________________________ Fran Lowes, DO     AS/MEDQ  D:  09/04/2010  T:  09/05/2010  Job:  [id number]  Electronically Signed by Fran Lowes DO on 09/14/2010 04:54:56 PM

## 2010-09-19 ENCOUNTER — Ambulatory Visit (HOSPITAL_COMMUNITY): Admission: RE | Admit: 2010-09-19 | Payer: Managed Care, Other (non HMO) | Source: Ambulatory Visit

## 2010-09-24 ENCOUNTER — Ambulatory Visit (HOSPITAL_COMMUNITY): Admission: RE | Admit: 2010-09-24 | Payer: Managed Care, Other (non HMO) | Source: Ambulatory Visit

## 2010-10-15 LAB — BASIC METABOLIC PANEL
Calcium: 8.8 mg/dL (ref 8.4–10.5)
GFR calc Af Amer: >60 mL/min (ref >60)
GFR calc non Af Amer: >60 mL/min (ref >60)
Sodium: 137 mEq/L (ref 135–145)

## 2010-10-15 LAB — TYPE AND SCREEN
ABO/RH(D): O POS
Antibody Screen: POSITIVE

## 2010-10-15 LAB — CBC
Hemoglobin: 6.8 g/dL — CL (ref 12.0–15.0)
RBC: 1.95 MIL/uL — ABNORMAL LOW (ref 3.87–5.11)

## 2010-10-15 LAB — PROTIME-INR: INR: 1.3 (ref 0.00–1.49)

## 2010-10-15 LAB — APTT: aPTT: 46 seconds — ABNORMAL HIGH (ref 24–37)

## 2010-10-16 LAB — CBC
Hemoglobin: 9.1 g/dL — ABNORMAL LOW (ref 12.0–15.0)
MCHC: 34 g/dL (ref 30.0–36.0)
RDW: 17.9 % — ABNORMAL HIGH (ref 11.5–15.5)

## 2010-10-16 LAB — RETICULOCYTES: RBC.: 2.75 MIL/uL — ABNORMAL LOW (ref 3.87–5.11)

## 2010-11-13 NOTE — Discharge Summary (Signed)
NAME:  Jessica Dunlap, Jessica Dunlap NO.:  [id number]   MEDICAL RECORD NO.:  [id number]          PATIENT TYPE:  INP   LOCATION:  A301                          FACILITY:  [facility]   PHYSICIAN:  Osvaldo Shipper, MD     DATE OF BIRTH:  [birth year]   DATE OF ADMISSION:  06/27/2008  DATE OF DISCHARGE:  12/30/2009LH                               DISCHARGE SUMMARY   PRIMARY MEDICAL DOCTOR:  Madelin Rear. Sherwood Gambler, MD   GASTROENTEROLOGIST:  Jonathon Bellows, MD   HOSPITAL CONSULTATIONS:  The patient was seen in consultation during  this admission by Dr. [name], oncology.   DISCHARGE DIAGNOSES:  1. Atrial tachyarrhythmia resolved.  2. Anemia unclear etiology.  3. History of liver cirrhosis unclear etiology.  4. History of hypothyroidism.   Please see the H and P dictated by Dr. Dorris Singh for details  regarding the patient's presenting illness.   BRIEF HOSPITAL COURSE:  Briefly, this is a 55 year old Caucasian female  who presented to the hospital with complaints of palpitation and  shortness of breath.  The patient was evaluated in the ED and was found  to have some form of atrial tachyarrhythmia.  The patient was started on  a Cardizem drip and was admitted to the hospital.  She converted to  sinus rhythm.  The patient was put on beta-blockers and Cardizem CD and  she has done well in the last couple of days.  She was seen by  cardiology who recommended that the patient follow up in their office in  2 weeks.   There was also a history of liver cirrhosis and the patient was having  some abdominal discomfort so she underwent a CT abdomen and pelvis which  revealed evidence for liver cirrhosis and a small pleural effusion;  increased abdominal and retroperitoneal lymphadenopathy were also noted.  Pelvic and inguinal lymphadenopathy were also seen.  The patient was  seen by GI as well with Dr. [name].  Dr. [name] felt that the  patient could have some kind of autoimmune  process because of presence  of multiple antibodies on her blood cross and match.  He is  contemplating using steroids, however, he would like to see this patient  in his office prior to initiating steroids.  Dr. [name] saw this patient  and recommended the patient go back to [facility] for further  workup and evaluation.  Cardiology did note that the patient had a small  pericardial effusion and they did not comment any further on this.  The  patient remains hemodynamically stable.   On the day of discharge the patient is feeling much better.  She denies  any shortness of breath or chest pain.  She is very keen on going home.  Vital signs are all stable.  Telemetry did not reveal any further  episodes of tachyarrhythmia.  Lungs clear to auscultation.  Cardiovascular, S1, S2, normal regular.  No murmurs appreciated.  No S3,  S4, no rubs, no bruits.  No JVD is noted.  Abdomen soft, nontender,  nondistended.  Extremities show minimal edema.  Blood work shows anemia  stable with a hemoglobin of 8.5, MCV is 101, WBCs 4.3, platelet count is  146.  ESR is 100, potassium 3.3, glucose 113.   Potassium will be repleted.  Otherwise the patient is stable to go home.  I have discussed this case with Dr. [name] and he wants the patient  to come and see him in his office next week.   DISCHARGE MEDICATIONS:  1. Cardizem CD 120 mg daily.  2. Metoprolol 50 mg b.i.d.  3. Trazodone 100 mg as needed at bedtime.  4. Lasix 40 mg b.i.d. and 20 as needed.  5. Bupropion 100 mg t.i.d.  6. Levothyroxine 275 mcg daily.  7. Paxil 40 mg daily.  8. Spironolactone 50 mg daily.  9. Hydromorphone 8 mg as needed.  10.Aspirin 81 mg daily.  11.Voltaren gel as needed.  12.Multivitamins.  13.Vitamin D3.  14.Fish oil.  15.Garlic.  16.Super-B complex.   FOLLOWUP:  1. Follow up with [facility] cardiologist      in 2 weeks.  2. With Dr. [name] early next week.  3. Dr. [name] is  facilitating advancing her appointment at [facility].   DIET:  Heart-healthy physical.   PHYSICAL ACTIVITY:  No restrictions.   Apart from the CT she also had a chest x-ray which showed cardiomegaly  with no acute findings.   Total time on discharge encounter 35 minutes.      Osvaldo Shipper, MD  Electronically Signed     GK/MEDQ  D:  06/29/2008  T:  06/29/2008  Job:  [id number]   cc:   R. Roetta Sessions, M.D.  [address]  [place]  [place] [address]   [name]. [name], MD  Fax: [phone]   Madelin Rear. Sherwood Gambler, MD  Fax: [phone]   [facility] [place]

## 2010-11-13 NOTE — H&P (Signed)
NAME:  Jessica Dunlap, Jessica Dunlap                   ACCOUNT NO.:  [id number]   MEDICAL RECORD NO.:  [id number]          PATIENT TYPE:  AMB   LOCATION:  DAY                           FACILITY:  [facility]   PHYSICIAN:  Dalia Heading, M.D.  DATE OF BIRTH:  [birth year]   DATE OF ADMISSION:  DATE OF DISCHARGE:  LH                              HISTORY & PHYSICAL   CHIEF COMPLAINT:  Left buttock abscess.   HISTORY OF PRESENT ILLNESS:  The patient is a 55 year old white female  who was referred for evaluation and treatment of a left buttock abscess.  No drainage has been noted.  It has been increasing in size over the  past 3 days.  She denies any fevers.   PAST MEDICAL HISTORY:  Includes MRSA infection on her back,  hypothyroidism, hypertension.   PAST SURGICAL HISTORY:  Unremarkable.   CURRENT MEDICATIONS:  Wellbutrin, trazodone, Synthroid, __________,  metoprolol.   ALLERGIES:  No known drug allergies.   REVIEW OF SYSTEMS:  The patient smokes a pack of cigarettes a day.  She  denies any alcohol use.  She denies any other cardiopulmonary  difficulties or bleeding disorders.   PHYSICAL EXAMINATION:  GENERAL:  The patient is a well-developed, well-  nourished white female in no acute distress.  LUNGS:  Clear to auscultation with equal breath sounds bilaterally.  HEART:  Reveals a regular rate and rhythm without S3, S4 or murmurs.  BACK:  Left buttock examination reveals a large fluctuant area without  drainage.  Erythema is noted.   IMPRESSION:  Left buttock abscess.   PLAN:  The patient is scheduled for incision and drainage of left  buttock abscess on April 13, 2008.  The risks and benefits of the  procedure were fully explained to the patient, getting informed consent.      Dalia Heading, M.D.  Electronically Signed     MAJ/MEDQ  D:  04/12/2008  T:  04/12/2008  Job:  [id number]   cc:   Madelin Rear. Sherwood Gambler, MD  Fax: [phone]

## 2010-11-13 NOTE — Consult Note (Signed)
NAME:  Jessica Dunlap, Jessica Dunlap                   ACCOUNT NO.:  [id number]   MEDICAL RECORD NO.:  [id number]          PATIENT TYPE:  INP   LOCATION:  A301                          FACILITY:  [facility]   PHYSICIAN:  R. Roetta Sessions, M.D. DATE OF BIRTH:  [birth year]   DATE OF CONSULTATION:  06/29/2008  DATE OF DISCHARGE:  06/29/2008                                 CONSULTATION   REASON FOR CONSULTATION:  Changes in liver disease.   REFERRING PHYSICIAN:  [name]. [name], MD.   HISTORY OF PRESENT ILLNESS:  The patient is a 55 year old female with a  history of NASH cirrhosis, biopsy proven, who is followed at the [facility] by Dr. Piedad Dunlap.  She was admitted with complaints of  palpitations and tachycardia.  We have been consulted regarding possible  progression of her liver disease.  She complains of a several-week  history of progressive lower extremity edema and ascites.  This has been  worse since around Thanksgiving.  She states over the last month or two  she has also been found to have recurrent anemia and saw Jessica Dunlap  recently and received 2 units of packed red blood cells.  She tells me  she is positive for blood antibodies and they are trying not to  transfuse her unless absolutely necessary.  It has been several months  she was last seen at [facility].  She states she was actually found to be  anemic and they referred her back to her family doctor.  We saw her a  couple of years ago for iron-deficiency anemia, with workup as outlined  below.  She denies any nausea or vomiting, melena or rectal bleeding.  She does have constipation which is treated successfully with MiraLax.  She denies any heartburn.  She has had diminish in her appetite since  the development of ascites.  She admittedly has some night sweats but  thought it was just hot flashes.  She has not had any objective fever.  She denies any weight loss.  Denies chest pain.  She has had some  dyspnea on exertion associated with  the tachycardia.   Upon presentation to the emergency department, she was found to have a  heart rate in the 130s.  She has had negative cardiac enzymes.  She has  been seen by [facility], who have cleared her for discharge  today.  She had a CT yesterday and had a new pericardial effusion of 12  mm, which I discussed with [facility] but it was felt that  no further workup really was needed at this time.   MEDICATIONS PRIOR TO ADMISSION:  Wellbutrin 100 mg b.i.d., trazodone 100  mg q.h.s., Paxil 40 mg daily, Dilaudid 8 mg p.r.n. written for 1 tablet  every 4 hours to every 6 hours as needed for pain, Lasix 50 mg b.i.d.,  levothyroxine 0.25 mg daily, 0.2 mg daily and 0.5 mg daily, paroxetine  40 mg daily, metoprolol 50 mg b.i.d., Aldactone 50 mg daily, aspirin 81  mg daily, Voltaren 1% gel transdermally as needed, multivitamin daily,  D3 1000 international units daily, fish oil 1000 mg daily, garlic 1000  mg daily, Super B complex 1 daily.   ALLERGIES:  NO KNOWN DRUG ALLERGIES.   PAST MEDICAL HISTORY:  1. Biopsy-proven NASH cirrhosis in 2007.  At that time she also had a      negative ANA, negative smooth muscle antibody, normal iron level      116, and her sed rate was minimally elevated at 30.  She was      referred by our practice to the [facility] for consideration      of any trials for NASH cirrhosis.  She is being followed by Dr.      Woodfin Dunlap.  In April of 2008, when she was seen there, she had a      ceruloplasmin level of 27, alpha 1 antitrypsin level of 179, alpha      fetoprotein tumor marker 3.7.  She had a negative hepatitis B      surface antibody and nonreactive hepatitis A antibody.  She has      been immunized for A and B.  She is negative for hepatitis C      antibody.  2. EGD and colonoscopy in April of 2008 by Dr. Jena Dunlap revealed a small      hiatal hernia, a couple of antral erosions felt to be due to      aspirin use, anal  papilla.  She gives a history of prior colonic      adenoma.  3. She has a history of autoimmune thyroiditis, status post iodine      therapy now, with secondary hypothyroidism, on Synthroid.  4. Depression.  5. Chronic low back pain.  6. Hypercholesterolemia.  7. Hypertriglyceridemia.  8. A history of MRSA of the back and recently had a buttock abscess.      I and D and cultures were positive for MRSA in October of 2009.  9. A history of iron-deficiency anemia.  10.A history of recurrent anemia, with recent blood transfusion as      above.  11.Hypertension.  12.A history of cervical cancer, status post laser treatment at age      83.  She had a complete hysterectomy per her report back in 2003.  13.She has had an appendectomy.  14.A history of arrhythmia.   FAMILY HISTORY:  Negative for chronic GI illnesses or liver disease.   SOCIAL HISTORY:  She is married.  She is disabled secondary to her back.  She continues to smoke.  No alcohol use.   REVIEW OF SYSTEMS:  See HPI for GI and constitutional.  GENITOURINARY:  No dysuria or hematuria.  CARDIOPULMONARY:  See HPI.  In addition, she  states her lower extremity edema is better on diuretic therapy.   PHYSICAL EXAM:  Temp 98, pulse 98, respirations 18, blood pressure  122/68, 02 sat is 97% on room air.  Height 63 inches, weight 79.6 kg.  GENERAL:  A pleasant somewhat chronically ill-appearing Caucasian female  in no acute distress.  SKIN:  Warm and dry.  No jaundice.  HEENT:  Sclerae nonicteric.  Oropharyngeal mucosa moist.  CHEST:  Lungs are clear to auscultation.  CARDIAC:  Exam reveals a regular rate and rhythm.  No murmurs, rubs, or  gallops.  ABDOMEN:  Positive bowel sounds.  The abdomen is distended but not  tense.  She has a positive fluid wave.  Cannot appreciate organomegaly  or masses.  LOWER EXTREMITIES:  Evidence of chronic  venous stasis and edematous  changes.  Currently trace to 1+ pitting edema bilaterally.    LABS:  Sed rate of 100, white count 4300, hemoglobin 8.5, has been  stable during hospitalization.  Platelets 146,000.  MCV 101.7.  Total  bilirubin 1.5, alkaline phosphatase 64, AST 16, ALT 8, albumin 3.3.  Sodium 140, potassium 3.3, BUN 3, creatinine 0.65, glucose 113.  TSH  2.354.  PTT 54.  INR 1.3.  PT 16.3.  Folate greater than 20.  B12 634.   CT of the abdomen and pelvis revealed cirrhosis with portal venous  hypertension with interval increase in significant amount of ascites.  She has a new pericardial effusion measuring 12 mm, small pleural  effusions, abdominal and retroperitoneal lymphadenopathy with the  largest lymphadenopathy seen in the inguinal regions, with the largest  measuring 2 cm.  She had a thick-walled distal colon questionably  secondary to ascites versus colitis.  She had a CT of the chest  angiogram May 18, 2008, and she had mild mediastinal adenopathy,  right middle lobe focus of air space opacity, cardiomegaly with right  atrial enlargement at that time.   IMPRESSION:  The patient is a 55 year old lady with biopsy-proven  nonalcoholic steatohepatitis with early cirrhosis in 2007, followed at  the [facility], who now has evidence of progression of her disease  based on computerized tomography findings of portal venous hypertension,  splenomegaly, and ascites.  She also has multiple other problems  including evidence of mediastinal lymphadenopathy on recent computerized  tomography angiogram and abdominal retroperitoneal inguinal  lymphadenopathy on current computerized tomography.  She also has a new  pericardial effusion, which I discussed with [facility]  today.  No further workup is planned.  She has a sedimentation rate of  100, ferritin at the 140s.  She did have a limited autoimmune hepatitis  workup previously.  There is some concern about a possibility of  underlying autoimmune process with more rapid progression of liver   disease.  She also has significant diffuse lymphadenopathy and would  like Dr. Thornton Papas input regarding this.   RECOMMENDATIONS:  1. Dr. Jena Dunlap to review her CT.  2. Will check serum immunoglobulin, ANA, and follow up ANA, which is      pending.  3. AFP tumor marker.  4. She will likely need abdominal paracentesis for fluid to be sent      for cytology and cultures.  Will discuss further with Dr. Jena Dunlap.  5. Hemoccult stool x3 for her anemia.  She is on NSAIDs and is at risk      for GI bleeding.  6. She may need to have lymph node biopsy but will let Jessica Dunlap      help address these findings.   I would like to thank Jessica Dunlap for allowing [place] to take part in the  care of this patient.      Tana Coast, P.AJonathon Bellows, M.D.  Electronically Signed    LL/MEDQ  D:  06/29/2008  T:  06/29/2008  Job:  [id number]   cc:   Jessica Horns. [name], MD  Fax: [phone]   [facility]   Madelin Rear. Sherwood Gambler, MD  Fax: [phone]   [name], MD  [facility]   R. Roetta Sessions, M.D.  [address]  [place]  [place] [address]

## 2010-11-13 NOTE — Op Note (Signed)
NAME:  Jessica Dunlap, Jessica Dunlap                   ACCOUNT NO.:  [id number]   MEDICAL RECORD NO.:  [id number]          PATIENT TYPE:  AMB   LOCATION:  DAY                           FACILITY:  [facility]   PHYSICIAN:  Dalia Heading, M.D.  DATE OF BIRTH:  Aug 19, 1955   DATE OF PROCEDURE:  DATE OF DISCHARGE:                               OPERATIVE REPORT   PREOPERATIVE DIAGNOSIS:  Left buttock abscess.   POSTOPERATIVE DIAGNOSIS:  Left buttock abscess.   PROCEDURE:  Incision and drainage of left buttock abscess.   SURGEON:  Dalia Heading, MD   ANESTHESIA:  General.   INDICATIONS:  The patient is a 56 year old white female who presents  with an abscess of the left buttock.  She has a previous history of  MRSA.  She will be receiving vancomycin preoperatively.  The risks and  benefits of the procedure were fully explained to the patient, gave  informed consent.   PROCEDURE NOTE:  The patient was placed in the right lateral decubitus  position after general anesthesia was administered.  The left buttock  was prepped and draped using the usual sterile technique with Betadine.  Surgical site confirmation was performed.   An incision was made over the abscess cavity.  A small amount of  purulent fluid was obtained, and aerobic and anaerobic cultures were  taken and sent to microbiology for further examination.  Any necrotic  tissue was debrided using the curette.  Any bleeding was controlled  using Bovie electrocautery.  The wound was packed with gauze impregnated  with Betadine.  A dry sterile dressing was then applied.   All tape and needle counts were correct at the end of the procedure.  The case was a dirty case.  The patient was extubated in the operating  room, went back to the recovery room, awake in stable condition.   COMPLICATIONS:  None.   SPECIMEN:  Aerobic and anaerobic cultures of abscess left buttock.   BLOOD LOSS:  Minimal.      Dalia Heading, M.D.  Electronically  Signed     MAJ/MEDQ  D:  04/13/2008  T:  04/14/2008  Job:  [id number]   cc:   Madelin Rear. Sherwood Gambler, MD  Fax: [phone]

## 2010-11-13 NOTE — Group Therapy Note (Signed)
NAME:  Jessica Dunlap, Jessica Dunlap                   ACCOUNT NO.:  [id number]   MEDICAL RECORD NO.:  [id number]          PATIENT TYPE:  OUT   LOCATION:  XRAY                          FACILITY:  [facility]   PHYSICIAN:  R. Roetta Sessions, M.D. DATE OF BIRTH:  [birth year]   DATE OF PROCEDURE:  DATE OF DISCHARGE:                                 PROGRESS NOTE   [name] recently hospitalized at [facility] for some hepatic  decompensation, history of nonalcoholic steatohepatitis, early cirrhosis  on scans 2007, now more recent scan shows progression of chronic liver  disease with ascites, also noted to have mediastinal lymphadenopathy and  retroperitoneal inguinal adenopathy.  She was seen in consultation by  Dr. [name] recently.  There is concern about an autoimmune process  superimposed.  We ordered an outpatient PET CT scan which confirms  inguinal/axillary adenopathy but scan really does not light up anywhere  else.  After reviewing the scan today and discussing the case further  with Dr. [name] it is pretty much unanimously recommended that she be  seen by general surgeon forthwith for excision biopsy of the most  successful lymph node which will be in all likelihood an enlarged left  iliac lymph node.  She is slated to follow up with the hepatology folks  down at [facility] in March of this year.  I have conveyed the PET findings and  my recommendations with [name] via telephone this afternoon.  At her  request we will get her a surgical referral with Dr. Franky Macho.      Jonathon Bellows, M.D.  Electronically Signed     RMR/MEDQ  D:  07/13/2008  T:  07/13/2008  Job:  [id number]   cc:   Dalia Heading, M.D.  Fax: [phone]   Madelin Rear. Sherwood Gambler, MD  Fax: [phone]

## 2010-11-13 NOTE — H&P (Signed)
NAME:  Jessica Dunlap, Jessica Dunlap NO.:  [id number]   MEDICAL RECORD NO.:  [id number]          PATIENT TYPE:  AMB   LOCATION:  DAY                           FACILITY:  [facility]   PHYSICIAN:  Dalia Heading, M.D.  DATE OF BIRTH:  Feb 19, 1956   DATE OF ADMISSION:  DATE OF DISCHARGE:  LH                              HISTORY & PHYSICAL   CHIEF COMPLAINT:  Generalized lymphadenopathy.   HISTORY OF PRESENT ILLNESS:  The patient is a 55 year old white female  who was referred for evaluation and treatment of generalized  lymphadenopathy.  She has multiple area lymphadenopathy, and Dr.  [name] and Dr. [name] would like a biopsy of the left inguinal lymph  node.   Past medical history is extensive and includes:  1. Cirrhosis with splenomegaly.  2. Ascites.  3. Anemia.  4. Depression.  5. Hypertension.  6. History of MRSA.  7. History of Graves disease.  8. Hypothyroidism.  9. Hypertension.   PAST SURGICAL HISTORY:  1. Appendectomy.  2. Hysterectomy.  3. Liver biopsy.   CURRENT MEDICATIONS:  Wellbutrin, trazodone, Paxil, Dilaudid, Lasix,  levothyroxine, Paxil, metoprolol, Aldactone, baby aspirin, multivitamin,  Voltaren gel.   ALLERGIES:  No known drug allergies.   REVIEW OF SYSTEMS:  No recent chest pain, MI, CVA, or diabetes mellitus.   On physical examination, the patient has a well-developed, well-  nourished white female in no acute distress.  HEENT examination reveals  no scleral icterus.  Lungs clear to auscultation with equal breath  sounds bilaterally.  Heart examination reveals regular rate and rhythm  without an S3, S4, murmurs.  The abdomen is soft with distention from  ascites.  It is nontender.  Left inguinal lymphadenopathy is noted with  small shoddy right inguinal lymphadenopathy.  Bilateral axillas reveal  shoddy lymphadenopathy.   IMPRESSION:  Generalized lymphadenopathy of unknown etiology.   PLAN:  The patient is scheduled for left inguinal lymph  node biopsy on  July 27, 2008.  Risks and benefits of the procedure including  bleeding and infection were fully explained to the patient, gave  informed consent.      Dalia Heading, M.D.  Electronically Signed     MAJ/MEDQ  D:  07/19/2008  T:  07/20/2008  Job:  [id number]   cc:   Short Stay at [facility]. [name], MD  Fax: [phone]   Madelin Rear. Sherwood Gambler, MD  Fax: [phone]   R. Roetta Sessions, M.D.  [address]  [place]  [place] [address]

## 2010-11-13 NOTE — H&P (Signed)
NAME:  Jessica Dunlap, Jessica Dunlap NO.:  [id number]   MEDICAL RECORD NO.:  [id number]          PATIENT TYPE:  INP   LOCATION:  A301                          FACILITY:  [facility]   PHYSICIAN:  Dorris Singh, DO    DATE OF BIRTH:  19-Mar-1956   DATE OF ADMISSION:  06/27/2008  DATE OF DISCHARGE:  LH                              HISTORY & PHYSICAL   The patient is a 55 year old Caucasian female who states that today she  felt that she was short of breath and her heart was racing.  She came to  the emergency room to be evaluated.  She mentions an extended history of  Graves' disease and hyperthyroidism and now she is being treated for  hypothyroidism.  She states that she feels like she is having an episode  of this.   PRIMARY CARE PHYSICIAN:  Dr. Maury Dus   The patient admits to seeing her primary care physician about 2 weeks  ago in which her medications were adjusted; actually some of her doses  were increased.  The patient states that she has had problems with her  thyroid in the past and feels that this might be a cause.  Also she has  been seen by Dr. Laurie Panda for anemia and states that she has several  antibodies to her blood and has not had any transfusions due to that.  We will go ahead and have Dr. Laurie Panda see her as well while he is here.  Also Dr. Elsie Lincoln of [facility] has seen the patient.   PAST MEDICAL HISTORY:  Significant for several things:  1. Hypertension.  2. Anxiety.  3. Arrhythmia.  4. Cirrhosis of liver.  5. Depression.  6. Graves disease.  7. Hypercholesterolemia.  8. Hyperthyroidism.   SURGICAL HISTORY:  She has had an appendectomy and hysterectomy.   FAMILY HISTORY:  Noncontributory.   SOCIAL HISTORY:  Non drinker, no drug abuse.  Lives with spouse and she  does smoke.   ALLERGIES:  No known drug allergies.   She is on several medications which include:  1. Wellbutrin 100 mg two times a day.  2. Trazodone 100 mg at bedtime.  3.  Paxil 40 mg once a day.  4. Dilaudid 8 mg as needed.  5. Lasix 50 mg twice a day.  6. Levothyroxine 0.025 mg daily.  7. Levothyroxine 0.2 mg daily.  8. Levothyroxine 0.05 mg daily.  9. Paroxetine 40 mg daily.  10.Metoprolol tartrate 50 mg twice a day.  11.Spironolactone 50 mg daily.  12.Hydromorphone  .  It is written 1 tab every 4 hours, 2 tabs every 6      as needed for pain.  13.Aspirin 81 mg p.o. daily.  14.Voltaren 1% gel TD as needed.  15.Multivitamin 1 tab daily.  16.D3 1000 international units p.o. daily.  17.Fish oil 1000 mg p.o. daily.  18.Garlic 1000 mg p.o. daily.  19.Super B complex  1 tab p.o. daily.   REVIEW OF SYSTEMS:  Constitutionally, positive for fatigue.  EYES:  Negative for any changes in vision.  Positive for exophthalmos.  EARS,  NOSE, MOUTH, THROAT:  All negative.  CARDIOVASCULAR:  Positive for  palpitations.  RESPIRATORY:  Negative for dyspnea or wheezing.  GASTROINTESTINAL:  Negative for nausea, vomiting.  GU:  Negative for CVA  tenderness.  GYN:  Negative.  MUSCULOSKELETAL:  Positive for swelling of  the lower extremities.  SKIN:  Negative for rash.  NEURO:  Negative for  loss of consciousness.   PHYSICAL EXAM:  Her current vitals:  Temperature 98.3, pulse 65,  respirations 18, blood pressure 104/50.  GENERAL:  The patient is a 55 year old Caucasian female who is well-  developed, well-nourished in no acute distress.  HEAD:  Normocephalic,  atraumatic.  NECK:  Supple.  No lymphadenopathy noted.  EARS, NOSE, MOUTH, THROAT:  Turbinates are moist.  Teeth are in fair  repair.  No erythema or exudate of throat.  EYES:  Positive  exophthalmos.  HEART:  Regular rate and rhythm.  S1, S2 present.  LUNGS:  Clear to auscultation bilaterally.  ABDOMEN:  Soft, nontender, positive distention, no organomegaly noted.  EXTREMITIES:  Plus 1 pitting edema and no ecchymosis or cyanosis.   LABS FOR TODAY:  White count of 3.7, hemoglobin 8.7, hematocrit 26.9,   platelet count 136.  Sodium 136, potassium 3.8, chloride 103, CO2 28,  glucose 118, BUN 5 and creatinine 0.52.  Her thyroid is 5.979.  Her T4  is 1.07 and her T3 is 2.3 and she was typed and crossed 1 unit.  She is  okay to  transfuse which they will do tonight.   ASSESSMENT/PLAN:  1. Tachycardia.  2. Anemia.  3. Thyroid disease.   PLAN:  Admit the patient to service of [facility].  [facility]  Cardiology has been consulted to see her.  They started her on a  Cardizem drip.  They will continue to monitor her tachycardia.  For  anemia, she has been cleared to be able to transfuse 1 unit of packed  red blood cells.  I will go ahead and continue with that and then also  get Dr.  Laurie Panda to see her.  For thyroid disease, she is still very hypothyroid.  We will continue with her current medications to see if this improves  her condition.  We will do DVT and GI prophylaxis and continue to  monitor her and make changes as necessary.      Dorris Singh, DO  Electronically Signed     CB/MEDQ  D:  06/28/2008  T:  06/28/2008  Job:  [id number]

## 2010-11-15 DIAGNOSIS — K7460 Unspecified cirrhosis of liver: Secondary | ICD-10-CM | POA: Diagnosis present

## 2010-11-16 NOTE — Op Note (Signed)
[facility]  Patient:    Jessica Dunlap, KOTLARZ Visit Number: [id number] MRN: [id number]          Service Type: MED Location: 4A A426 01 Attending Physician:  Lazaro Arms Dictated by:   Duane Lope, M.D. Proc. Date: 10/28/01 Admit Date:  10/26/2001                             Operative Report  PREOPERATIVE DIAGNOSES:  1. Menorrhagia.  2. Anemia requiring blood transfusion.  POSTOPERATIVE DIAGNOSES:  1. Menorrhagia.  2. Anemia requiring blood transfusion.  3. Left hydrosalpinx.  PROCEDURE:  TAH/BSO.  SURGEON:  Duane Lope, M.D.  ANESTHESIA:  General endotracheal.  FINDINGS:  The patient had a left hydrosalpinx and the left tube and ovary were densely adherent to the pelvic sidewall and to the sigmoid colon. There were some filmy adhesions of the uterus at the posterior cul-de-sac and the right ovary basically appeared to be normal.  DESCRIPTION OF PROCEDURE:  The patient was taken to the operating room and placed in the supine position where she underwent general endotracheal anesthesia. She was then prepped and draped in the usual sterile fashion. A Foley catheter was placed and her vagina was prepped. A Pfannenstiel skin incision was made and carried down sharply at the rectus fascia which was scored in the midline and extended laterally. The fascia was taken off the muscles both superiorly and inferiorly without difficulty. The muscles were divided and the peritoneal cavity was entered under direct visualization. A Balfour self retaining retractor was placed, the patient was placed in Trendelenburg position and the upper abdomen was packed away. The adhesions of the left ovary and tube to the left pelvic sidewall and sigmoid colon were seen and these were taken down sharply with Metzenbaum scissors taking great care not to injure the sigmoid colon or the blood supply. The ureter was identified well out of the area of dissection. The  infundibulopelvic ligament was identified and a vascular window was made. It was double clamped, cut, and double suture ligated with good hemostasis. The utero-ovarian ligament was then cross clamped, an avascular window made and the ovary and tube were removed and a suture placed. The uterine vessels were skeletonized on the left. The right round ligament was suture ligated and cut. The infundibulopelvic ligament was isolated, an avascular window made. It was double clamped, cut and double suture ligated with good hemostasis. The uterine vessels were then skeletonized on the right. The left round ligament was then suture ligated and cut. The vesicouterine serosal flap was created on the left side and then the right and the bladder was pushed off the lower uterine segment without difficulty. The uterine vessels were identified and clamped bilaterally. Both pedicles were cut, suture ligated and cut. Serial pedicles were taken down the cervix through the cardinal ligament, each pedicle being clamped, cut and transfixing suture ligated. There was good hemostasis. The vagina was then cross clamped and the specimen was removed. Vaginal angled sutures were placed and interrupted figure-of-eight sutures were placed for additional hemostasis. The pelvis was irrigated vigorously. The vagina was closed with interrupted figure-of-eight sutures. The area of peritoneal damage in the left sigmoid region was then closed with interrupted sutures with good hemostasis. The cardinal ligaments were then plicated together with the existing ______ vaginal angled sutures and there was good vaginal support. All pedicles were found to be hemostatic, Surgicel was placed because of the patients  elevated PT and it was placed on the pelvis. All sutures were cut. The Balfour self retaining retractor was removed as were the upper abdominal packs. The muscles and peritoneum were closed in interrupted fashion. The fascia  was closed using #0 Vicryl in a running fashion. The subcutaneous tissue was made hemostatic and irrigated. The skin was closed using skin staples. The patient tolerated the procedure well. She experienced 325 cc of blood loss intraoperatively and was taken to the recovery room in good stable condition. All counts were correct x3. She received Ancef prophylactically and she also received 2 units of blood prior to surgery. Dictated by:   Duane Lope, M.D. Attending Physician:  Lazaro Arms DD:  10/28/01 TD:  10/29/01 Job: [id number]

## 2010-11-16 NOTE — Op Note (Signed)
NAME:  [name], Jessica Dunlap                   ACCOUNT NO.:  [id number]   MEDICAL RECORD NO.:  [id number]          PATIENT TYPE:  AMB   LOCATION:  DAY                           FACILITY:  [facility]   PHYSICIAN:  R. Roetta Sessions, M.D. DATE OF BIRTH:  [birth year]   DATE OF PROCEDURE:  10/07/2006  DATE OF DISCHARGE:                               OPERATIVE REPORT   PROCEDURE:  Surveillance EGD followed by colonoscopy.   INDICATIONS FOR PROCEDURE:  The patient is a 55 year old lady with  history colonic adenoma removed from her colon back in 2003.  She also  has history of NASH with cirrhosis.  EGD is now being done to screen her  for varices and also colonoscopy is being performed as a surveillance  maneuver.  This approach has discussed with the patient at length.  Potential risks, benefits and alternatives have been reviewed, questions  answered.  She is agreeable.  Please see documentation in the medical  record.   PROCEDURE NOTE:  O2 saturation, blood pressure, pulse and respirations  monitored throughout the entire procedure.   CONSCIOUS SEDATION:  Versed 4 mg IV, Demerol 100 mg IV in divided doses.  Cetacaine spray for topical oropharyngeal anesthesia.   INSTRUMENT:  Pentax video chip system.   FINDINGS ON EXAMINATION:  Tubular esophagus revealed no mucosal  abnormalities.  There were no varices.  EG junction easily traversed.   Stomach:  Gastric cavity was emptied and insufflated well with air.  Thorough examination of the gastric mucosa including retroflexed of the  proximal stomach, esophagogastric junction demonstrated small hiatal  hernia, a couple of antral erosions (most consistent with aspirin  effect), otherwise gastric mucosa appeared normal.  There was no portal  gastropathy.  Pylorus was patent, easily traversed.  Examination of the  bulb, second portion revealed no abnormalities.   THERAPEUTIC/DIAGNOSTIC MANEUVERS PERFORMED:  None.   The patient tolerated the procedure well  and was prepared for  colonoscopy.  Digital rectal exam revealed no abnormalities.   ENDOSCOPIC FINDINGS:  The prep was adequate.  Examination of colonic  mucosa was undertaken from the rectosigmoid junction through the left,  transverse and right colon to the area of the appendiceal orifice,  ileocecal valve and cecum.  These structures were well seen and  photographed for the record.  From this level the scope slowly and  cautiously withdrawn.  All previously mentioned mucosal surfaces were  again seen.  The colonic mucosa appeared normal.  The scope was pulled  down in the rectum where thorough examination of the rectal mucosa  including retroflexed view of the anal verge was undertaken.  The  patient had some anal papilla, otherwise the rectal mucosa appeared  normal.  The patient tolerated both procedures was reactive to  endoscopy.   IMPRESSION:  1. Normal esophagus.  2. Small hiatal hernia.  3. Couple of antral erosions consistent with aspirin effect, otherwise      normal stomach, patent pylorus, normal D1 and D2. 4.  Colonoscopy      findings:  Anal papilla, otherwise normal rectum and normal colon.  RECOMMENDATIONS:  1. Repeat colonoscopy 5 years.  2. The patient is slated to see the hepatology __________  at [facility] on      October 13, 2006.  I  advised to her to keep that appointment.      Jonathon Bellows, M.D.  Electronically Signed     RMR/MEDQ  D:  10/07/2006  T:  10/07/2006  Job:  [id number]

## 2010-11-16 NOTE — Discharge Summary (Signed)
[facility]  Patient:    Jessica Dunlap, Jessica Dunlap Visit Number: [id number] MRN: [id number]          Service Type: MED Location: 4A A426 01 Attending Physician:  Lazaro Arms Dictated by:   Duane Lope, M.D. Admit Date:  10/26/2001 Discharge Date: 10/31/2001                             Discharge Summary  DISCHARGE DIAGNOSES: 1. Status post a total abdominal hysterectomy/bilateral salpingo-oophorectomy. 2. Menometrorrhagia. 3. Anemia. 4. Status post transfusion of two units of packed red blood cells.  PROCEDURES: 1. Admission to the hospital and decision for surgery on October 26, 2001. 2. Daily ______  on October 27, 2001. 3. Total abdominal hysterectomy/ bilateral salpingo-oophorectomy on    October 28, 2001.  HISTORY:  Please refer to the transcribed history and physical for details of admission to the hospital.  HOSPITAL COURSE:  The patient was admitted with heavy vaginal bleeding.  We had been trying to get her blood counts up on an outpatient basis but were unsuccessful.  She was given IV Premarin to try to stop her bleeding and she was given two units of packed red blood cells.  We proceeded with a hysterectomy and BSO on October 28, 2001 after her hemoglobin and hematocrit were 8.9 and 25.2.  Her intraoperative course was unremarkable and postoperative course was unremarkable.  She tolerated clear liquids and a regular diet, voided without symptoms, ambulated without symptoms.  Her hemoglobin and hematocrit postop settled at 9.2 and 26.3 and she was stable, tolerating oral pain medicine.  Her incision was clean, dry and intact.  She was discharged to home on Oct 31, 2001, her postop day #3, in good and stable condition.  FOLLOWUP:  Follow up in the office next week to have her staples removed.  DISCHARGE INSTRUCTIONS:  She was given instructions and precautions for return prior to that time.  DISCHARGE MEDICATIONS:  She was given Toradol and Tylox for  pain. Dictated by:   Duane Lope, M.D. Attending Physician:  Lazaro Arms DD:  12/15/01 TD:  12/16/01 Job: [id number]

## 2010-11-16 NOTE — H&P (Signed)
[facility]  Patient:    Jessica Dunlap, Jessica Dunlap Visit Number: [id number] MRN: [id number]          Service Type: MED Location: 4A A426 01 Attending Physician:  Lazaro Arms Dictated by:   Duane Lope, M.D. Admit Date:  10/26/2001                           History and Physical  DATE OF BIRTH:  [birth year]  SOCIAL SECURITY NUMBER:  [id number]  HISTORY OF PRESENT ILLNESS:  The patient is a 55 year old white female, gravida 5, para 3, spontaneous abortion 1, elective abortion 1, who called today stating that she was bleeding extremely heavy, passing clots, filling up towels and pads, basically every five minutes.  The patient has a long history of extremely heavy menses for the past several years, getting much worse.  In fact, about three months ago, she went into the hospital for an evaluation of palpitations, and was found to be significantly anemic, and was transfused two units of blood.  She had a complete gastroenterology workup by Dr. [name] and was found to be clear for that, and is attributed to her significant menstrual bleeding.  She has been on ________ 1 mg to try to diminish her bleeding, but that has been unsuccessful.  She comes today with blood running down her leg and having soaked a towel in the five minutes it took her to drive from her house.  PAST MEDICAL HISTORY: 1. Significant for hyperthyroidism, treated with radioactive iodine 131, and    now, she is hypothyroid. 2. Benign heart palpitations. 3. Depression. 4. Degenerative disk disease.  PAST SURGICAL HISTORY:  She had an X-flap for a pelvic abscess and appendectomy in the past, and she also had a D&C for heavy vaginal bleeding.  PAST OB HISTORY:  She had three vaginal deliveries, one miscarriage, and one elective termination.  ALLERGIES:  No known drug allergies.  MEDICATIONS:  Paxil 40 a day, Valium 5 every night, Percocet one twice a day for her back, metoprolol 50 mg  twice a day for palpitations, trazodone 100 mg q.h.s. for sleep, Synthroid 0.175 mg daily, Lopid 600 mg before breakfast and before supper, 30 minutes prior to each.  SOCIAL HISTORY:  She does smoke one-pack of cigarettes a day.  REVIEW OF SYSTEMS:  As per the HPI and otherwise negative.  PHYSICAL EXAMINATION:  VITAL SIGNS:  Blood pressure 128/74.  Hemoglobin is 11.2.  Just two weeks ago, it was 12.7.  HEENT:  Unremarkable with normal thyroid.  LUNGS:  Clear.  HEART:  Showed a regular rhythm without regurgitation or gallop.  She is bradycardic.  ABDOMEN:  Benign.  PELVIC:  Reveals a retroverted uterus, feels normal size, approximately 8-10 weeks size.  Adnexa negative.  EXTREMITIES:  Warm and no edema.  NEUROLOGIC:  Exam is grossly intact and she is bleeding heavily.  IMPRESSION: 1. Menorrhagia. 2. Anemia. 3. Status post blood transfusions due to menorrhagia.  PLAN:  The patient is admitted for IV Premarin.  She is given four ______ here in the office orally.  She will be given IV Premarin to try to stop her bleeding.  I will also workup a little bit her bleeding factors.  She is typed and screened, and preparations will be made for the patient to undergo a vaginal hysterectomy at the end of the week if we can get her stabilized. Dictated by:   Duane Lope, M.D.  Attending Physician:  Lazaro Arms DD:  10/26/01 TD:  10/26/01 Job: [id number]

## 2010-11-16 NOTE — Op Note (Signed)
[facility]  Patient:    Jessica Dunlap, Jessica Dunlap Visit Number: [id number] MRN: [id number]          Service Type: END Location: DAY Attending Physician:  Jonathon Bellows Dictated by:   Roetta Sessions, M.D. Proc. Date: 08/14/01 Admit Date:  08/14/2001 Discharge Date: 08/14/2001   CC:         Dr. Veneta Penton, Internal Medicine at [facility].   Operative Report  PROCEDURE:  Diagnostic esophagogastroduodenoscopy with biopsy followed by colonoscopy and biopsy.  INDICATIONS FOR PROCEDURE:  The patient is a 55 year old lady with well-documented iron-deficiency anemia.  She had been using nonsteroidal agents until November of last year.  She is devoid of any GI symptoms.  She has heavy menses.  She is hemoccult-negative.  An EGD and colonoscopy are now being done to further evaluate her iron deficiency.  A small bowel biopsy will be taken to screen for celiac disease.  This approach has been discussed with the patient previously and again today at the bedside.  Potential risks, benefits, and alternatives have been reviewed, questions answered.  CONSCIOUS SEDATION:  Versed 3 mg IV, Demerol 50 mg IV in divided doses.  INSTRUMENT:  Olympus videochip gastroscope and colonoscope.  DESCRIPTION OF PROCEDURE:  Oxygen saturation, blood pressure, pulse and respirations were monitored throughout the entirety of both procedures.  EGD FINDINGS:  Examination of the tubular esophagus revealed no abnormalities.  STOMACH:  Gastric cavity was empty, insufflated well with air.  A thorough examination of the gastric mucosa including retroflexed view of the proximal stomach and esophagogastric junction demonstrated only a small hiatal hernia. and a couple of antral erosions.  Pylorus was patent and easily traversed.  DUODENUM:  D1, D2 and D3 appeared normal.  THERAPEUTIC/DIAGNOSTIC MANEUVERS PERFORMED: 1. Biopsy of the second and third portions of the duodenum were taken. 2. Antral biopsies  and CLO-testing were performed.  The patient tolerated the procedure well and was prepared for colonoscopy.  FINDINGS:  Digital rectal examination revealed no abnormalities.  ENDOSCOPIC FINDINGS:  Prep was good.  RECTAL:  Examination of the rectal mucosa including retroflexed view  of the anal verge revealed no abnormalities.  COLON:  The colonic mucosa was surveyed from the rectosigmoid junction through the left, transverse and right colon to the area of the appendiceal orifice, ileocecal valve and cecum.  These structures were well seen and photographed for the record.  The patient had a 3 mm polyp at the appendiceal orifice as well as one on the ileocecal valve.  They were both cold biopsied/removed. From the level the scope was slowly withdrawn and all previously mentioned mucosal surfaces were again seen.  No abnormalities were observed.  The patient tolerated the procedure well and was reacted in endoscopy.  IMPRESSION: EGD: 1. Normal esophagus. 2. Small hiatal hernia. 3. Antral erosions of uncertain significance. 4. The remainder of the stomach appeared normal with normal D1-D3. 4. Biopsies of the small bowel taken and CLO-testing taken.  COLONOSCOPY FINDINGS: 1. Normal rectum. 2. _____ polyps in the right colon, cold biopsy/ removed. 3. The remainder of the colonic mucosa appeared normal.  RECOMMENDATIONS:  Follow up on pathology.  Further recommendations to follow.   RECOMMENDATIONS:    The patient tolerated the procedure very well and was returned to his room.  IMPRESSION: 3. The remainder of the upper gastrointestinal tract appeared normal.  RECOMMENDATIONS: Dictated by:   Roetta Sessions, M.D. Attending Physician:  Jonathon Bellows DD:  08/14/01 TD:  08/14/01 Job: [id number]

## 2010-11-16 NOTE — H&P (Signed)
NAME:  Jessica Dunlap, Jessica Dunlap                   ACCOUNT NO.:  [id number]   MEDICAL RECORD NO.:  [id number]          PATIENT TYPE:  AMB   LOCATION:                                FACILITY:  [facility]   PHYSICIAN:  R. Roetta Sessions, M.D. DATE OF BIRTH:  June 05, 1956   DATE OF ADMISSION:  DATE OF DISCHARGE:  LH                              HISTORY & PHYSICAL   History of colonic adenoma 2003, history of steatohepatitis with  cirrhosis. Last EGD 2003. No varices. The patient has steatohepatitis  with cirrhosis. She is scheduled to see hepatologist at [facility] on April 14  for tertiary opinion to see if anything is specifically available as far  as clinical trials are concerned. The patient has been unable to  exercise secondary to fatigue and chronic low back pain. She has gained  2 pounds since her November 2007 visit with me. Has not had any melena  or rectal bleeding. It is important she did have antibodies drawn to  check for __________ hepatitis B and A. I do not have those on the chart  for review at this time. She is doing well and has not had any  significant intercurrent illnesses. Liver biopsy May 14, 2006  demonstrated steatohepatitis with an advanced fibrosis consistent with  early cirrhosis. Iron saturation previously was found to be normal.  Antinuclear antibody came back negative. Anti-smooth muscle count came  back negative at 11. Sed rate was slightly elevated at 30. Hepatitis B  and C markers came back negative as well. Last set of liver enzymes from  August 2007 demonstrated GOT of 75, GPG of 120.   She does not consume alcohol. She had a colonoscopy for iron deficiency  anemia back in 2003 and had a small adenoma removed from her left colon.  At the time she had an EGD which revealed no esophageal varices.   PAST MEDICAL HISTORY:  Significant for hyperthyroidism, depression,  chronic low back pain, hypercholesterolemia, hypertriglyceridemia.   PAST SURGICAL HISTORY:  Hysterectomy,  appendectomy.   The liver appeared normal in 2006 on abdominal CT.   CURRENT MEDICATIONS:  1. Synthroid 150 mcg daily.  2. Paxil 40 mg daily.  3. Trazodone 100 mg at bedtime.  4. Wellbutrin 150 mg three tablets daily.  5. Garlic 6500 mg daily.  6. ASA 81 mg daily.  7. Fish oil 1 g daily.  8. Metoprolol 50 mg b.i.d.  9. Dilaudid 4 mg q.3 h.  10.OxyContin 20 mg b.i.d.   ALLERGIES:  No known drug allergies.   FAMILY HISTORY:  Negative for chronic GI or liver illness.   SOCIAL HISTORY:  The patient is married. She is disabled on account of  her back. She smokes one pack of cigarettes every day. No alcohol.   REVIEW OF SYSTEMS:  No chest pain, dyspnea, exertion. No fever, chills,  weight gain as outlined above.   PHYSICAL EXAMINATION:  GENERAL:  A pleasant 55 year old resting  comfortably. There is no jaundice, no scleral icterus. Conjunctivae are  pink.  CHEST:  Lungs clear to auscultation.  HEART:  Regular  rate and rhythm without murmur, gallop or rub.  BREASTS:  Deferred.  ABDOMEN:  Obese, positive bowel sounds, soft, nontender. No obvious  ascites. Liver percusses 3 fingerbreadths below right costal margin. I  do not appreciate spleen.  EXTREMITIES:  No edema.   IMPRESSION:  The patient is a pleasant 55 year old lady with  steatohepatitis with early cirrhosis as demonstrated on the liver  biopsy. She is significantly over her ideal body weight. It has been  five years since she had EGD. At that time she did not have any varices.  We opted to go ahead and look and check her for varices at this time. In  addition with a history of colonic adenoma removed five years ago, she  ought to go ahead and have a colonoscopy. To this end I have offered the  patient an EGD, colonoscopy in the very near future. Potential risks,  benefits, and alternatives have been reviewed. Questions answered. She  is agreeable. I am glad to see she has an appointment to see the  hepatologist down at  [facility] on April 14. I have advised her to certainly  keep that appointment. Further recommendations to follow.      Jonathon Bellows, M.D.  Electronically Signed     RMR/MEDQ  D:  08/28/2006  T:  08/28/2006  Job:  [id number]

## 2010-12-10 ENCOUNTER — Other Ambulatory Visit (HOSPITAL_COMMUNITY): Payer: Self-pay | Admitting: Internal Medicine

## 2010-12-10 DIAGNOSIS — Z139 Encounter for screening, unspecified: Secondary | ICD-10-CM

## 2010-12-18 ENCOUNTER — Ambulatory Visit (HOSPITAL_COMMUNITY): Payer: Managed Care, Other (non HMO)

## 2011-03-26 LAB — CULTURE, ROUTINE-ABSCESS

## 2011-04-01 LAB — BASIC METABOLIC PANEL
Calcium: 9
Chloride: 99
Creatinine, Ser: 0.66
GFR calc Af Amer: >60
GFR calc non Af Amer: >60

## 2011-04-01 LAB — POCT I-STAT 4, (NA,K, GLUC, HGB,HCT)
Glucose, Bld: 116 — ABNORMAL HIGH
HCT: 24 — ABNORMAL LOW
Hemoglobin: 8.2 — ABNORMAL LOW
Potassium: 4.7
Sodium: 138

## 2011-04-01 LAB — WOUND CULTURE

## 2011-04-01 LAB — CBC
RBC: 2.24 — ABNORMAL LOW
WBC: 4.9

## 2011-04-01 LAB — ANAEROBIC CULTURE

## 2011-04-04 LAB — CBC
HCT: 25.4 % — ABNORMAL LOW (ref 36.0–46.0)
HCT: 26.5 % — ABNORMAL LOW (ref 36.0–46.0)
HCT: 26.7 % — ABNORMAL LOW (ref 36.0–46.0)
HCT: 26.9 % — ABNORMAL LOW (ref 36.0–46.0)
Hemoglobin: 8.4 g/dL — ABNORMAL LOW (ref 12.0–15.0)
Hemoglobin: 8.5 g/dL — ABNORMAL LOW (ref 12.0–15.0)
MCHC: 32.5 g/dL (ref 30.0–36.0)
MCHC: 33 g/dL (ref 30.0–36.0)
MCHC: 33 g/dL (ref 30.0–36.0)
MCV: 100.2 fL — ABNORMAL HIGH (ref 78.0–100.0)
MCV: 100.8 fL — ABNORMAL HIGH (ref 78.0–100.0)
MCV: 101 fL — ABNORMAL HIGH (ref 78.0–100.0)
MCV: 102.2 fL — ABNORMAL HIGH (ref 78.0–100.0)
Platelets: 136 10*3/uL — ABNORMAL LOW (ref 150–400)
Platelets: 142 10*3/uL — ABNORMAL LOW (ref 150–400)
Platelets: 171 10*3/uL (ref 150–400)
RBC: 2.54 MIL/uL — ABNORMAL LOW (ref 3.87–5.11)
RBC: 2.66 MIL/uL — ABNORMAL LOW (ref 3.87–5.11)
RDW: 20.5 % — ABNORMAL HIGH (ref 11.5–15.5)
RDW: 20.6 % — ABNORMAL HIGH (ref 11.5–15.5)
RDW: 20.6 % — ABNORMAL HIGH (ref 11.5–15.5)
RDW: 20.7 % — ABNORMAL HIGH (ref 11.5–15.5)

## 2011-04-04 LAB — CROSSMATCH
ABO/RH(D): O POS
DAT, IgG: POSITIVE
DAT, IgG: POSITIVE
Donor AG Type: NEGATIVE

## 2011-04-04 LAB — COMPREHENSIVE METABOLIC PANEL
ALT: 9 U/L (ref 0–35)
Albumin: 3.3 g/dL — ABNORMAL LOW (ref 3.5–5.2)
Albumin: 3.5 g/dL (ref 3.5–5.2)
Albumin: 3.9 g/dL (ref 3.5–5.2)
Alkaline Phosphatase: 65 U/L (ref 39–117)
BUN: 4 mg/dL — ABNORMAL LOW (ref 6–23)
BUN: 7 mg/dL (ref 6–23)
CO2: 28 mEq/L (ref 19–32)
Creatinine, Ser: 0.49 mg/dL (ref 0.4–1.2)
Creatinine, Ser: 0.69 mg/dL (ref 0.4–1.2)
GFR calc Af Amer: >60 mL/min (ref >60)
GFR calc Af Amer: >60 mL/min (ref >60)
Potassium: 3.5 mEq/L (ref 3.5–5.1)
Total Bilirubin: 1.5 mg/dL — ABNORMAL HIGH (ref 0.3–1.2)
Total Protein: 6.7 g/dL (ref 6.0–8.3)
Total Protein: 7 g/dL (ref 6.0–8.3)
Total Protein: 7.5 g/dL (ref 6.0–8.3)

## 2011-04-04 LAB — T3 UPTAKE: T3 Uptake Ratio: 32.2 % (ref 22.5–37.0)

## 2011-04-04 LAB — DIFFERENTIAL
Basophils Absolute: 0 10*3/uL (ref 0.0–0.1)
Basophils Absolute: 0 10*3/uL (ref 0.0–0.1)
Basophils Absolute: 0 10*3/uL (ref 0.0–0.1)
Basophils Relative: 0 % (ref 0–1)
Basophils Relative: 0 % (ref 0–1)
Eosinophils Absolute: 0 10*3/uL (ref 0.0–0.7)
Eosinophils Absolute: 0.1 10*3/uL (ref 0.0–0.7)
Eosinophils Relative: 2 % (ref 0–5)
Lymphocytes Relative: 17 % (ref 12–46)
Lymphocytes Relative: 18 % (ref 12–46)
Lymphocytes Relative: 19 % (ref 12–46)
Monocytes Absolute: 0.1 10*3/uL (ref 0.1–1.0)
Monocytes Absolute: 0.2 10*3/uL (ref 0.1–1.0)
Monocytes Absolute: 0.2 10*3/uL (ref 0.1–1.0)
Monocytes Absolute: 0.3 10*3/uL (ref 0.1–1.0)
Monocytes Absolute: 0.3 10*3/uL (ref 0.1–1.0)
Monocytes Relative: 4 % (ref 3–12)
Monocytes Relative: 6 % (ref 3–12)
Monocytes Relative: 8 % (ref 3–12)
Neutro Abs: 3.1 10*3/uL (ref 1.7–7.7)
Neutro Abs: 3.1 10*3/uL (ref 1.7–7.7)
Neutro Abs: 3.2 10*3/uL (ref 1.7–7.7)
Neutrophils Relative %: 86 % — ABNORMAL HIGH (ref 43–77)

## 2011-04-04 LAB — POCT CARDIAC MARKERS
CKMB, poc: <1 ng/mL — ABNORMAL LOW (ref 1.0–8.0)
Troponin i, poc: <0.05 ng/mL (ref 0.00–0.09)

## 2011-04-04 LAB — T4: T4, Total: 9.9 ug/dL (ref 5.0–12.5)

## 2011-04-04 LAB — BASIC METABOLIC PANEL
CO2: 27 mEq/L (ref 19–32)
Calcium: 8.6 mg/dL (ref 8.4–10.5)
Calcium: 8.9 mg/dL (ref 8.4–10.5)
Chloride: 99 mEq/L (ref 96–112)
GFR calc Af Amer: >60 mL/min (ref >60)
GFR calc non Af Amer: >60 mL/min (ref >60)
Glucose, Bld: 113 mg/dL — ABNORMAL HIGH (ref 70–99)
Glucose, Bld: 118 mg/dL — ABNORMAL HIGH (ref 70–99)
Sodium: 133 mEq/L — ABNORMAL LOW (ref 135–145)
Sodium: 140 mEq/L (ref 135–145)

## 2011-04-04 LAB — RHEUMATOID FACTOR: Rhuematoid fact SerPl-aCnc: <20 IU/mL (ref 0–20)

## 2011-04-04 LAB — CARDIAC PANEL(CRET KIN+CKTOT+MB+TROPI)
CK, MB: 0.7 ng/mL (ref 0.3–4.0)
Relative Index: INVALID (ref 0.0–2.5)
Relative Index: INVALID (ref 0.0–2.5)
Troponin I: 0.01 ng/mL (ref 0.00–0.06)
Troponin I: <0.01 ng/mL (ref 0.00–0.06)

## 2011-04-04 LAB — SEDIMENTATION RATE: Sed Rate: 100 mm/hr — ABNORMAL HIGH (ref 0–22)

## 2011-04-04 LAB — PROTIME-INR
INR: 1.3 (ref 0.00–1.49)
Prothrombin Time: 16.3 seconds — ABNORMAL HIGH (ref 11.6–15.2)

## 2011-04-04 LAB — RETICULOCYTES: Retic Ct Pct: 6.4 % — ABNORMAL HIGH (ref 0.4–3.1)

## 2011-04-04 LAB — IRON AND TIBC
Saturation Ratios: 24 % (ref 20–55)
UIBC: 242 ug/dL

## 2011-04-04 LAB — APTT: aPTT: 54 seconds — ABNORMAL HIGH (ref 24–37)

## 2011-04-04 LAB — ANTI-NUCLEAR AB-TITER (ANA TITER): ANA Titer 1: 1:160 {titer} — ABNORMAL HIGH

## 2011-04-19 DIAGNOSIS — E039 Hypothyroidism, unspecified: Secondary | ICD-10-CM | POA: Diagnosis present

## 2011-05-10 ENCOUNTER — Other Ambulatory Visit (HOSPITAL_COMMUNITY): Payer: Self-pay | Admitting: Internal Medicine

## 2011-05-10 DIAGNOSIS — Z139 Encounter for screening, unspecified: Secondary | ICD-10-CM

## 2011-05-17 ENCOUNTER — Ambulatory Visit (HOSPITAL_COMMUNITY): Payer: Managed Care, Other (non HMO)

## 2011-05-28 ENCOUNTER — Ambulatory Visit (HOSPITAL_COMMUNITY): Payer: Managed Care, Other (non HMO)

## 2011-06-21 ENCOUNTER — Ambulatory Visit (HOSPITAL_COMMUNITY)
Admission: RE | Admit: 2011-06-21 | Discharge: 2011-06-21 | Disposition: A | Discharge status: Home / Self Care | Payer: 59 | Source: Ambulatory Visit | Attending: Internal Medicine | Admitting: Internal Medicine

## 2011-06-21 DIAGNOSIS — Z139 Encounter for screening, unspecified: Secondary | ICD-10-CM

## 2011-06-21 DIAGNOSIS — Z1231 Encounter for screening mammogram for malignant neoplasm of breast: Secondary | ICD-10-CM | POA: Insufficient documentation

## 2011-07-04 ENCOUNTER — Other Ambulatory Visit: Payer: Self-pay | Admitting: Internal Medicine

## 2011-07-04 DIAGNOSIS — R928 Other abnormal and inconclusive findings on diagnostic imaging of breast: Secondary | ICD-10-CM

## 2011-07-17 ENCOUNTER — Ambulatory Visit (HOSPITAL_COMMUNITY): Payer: 59

## 2011-07-24 ENCOUNTER — Ambulatory Visit (HOSPITAL_COMMUNITY)
Admission: RE | Admit: 2011-07-24 | Discharge: 2011-07-24 | Disposition: A | Discharge status: Home / Self Care | Payer: 59 | Source: Ambulatory Visit | Attending: Internal Medicine | Admitting: Internal Medicine

## 2011-07-24 DIAGNOSIS — R928 Other abnormal and inconclusive findings on diagnostic imaging of breast: Secondary | ICD-10-CM

## 2011-09-23 ENCOUNTER — Encounter: Payer: Self-pay | Admitting: Internal Medicine

## 2011-11-14 DIAGNOSIS — D591 Autoimmune hemolytic anemia, unspecified: Secondary | ICD-10-CM | POA: Insufficient documentation

## 2012-02-28 DIAGNOSIS — K769 Liver disease, unspecified: Secondary | ICD-10-CM | POA: Insufficient documentation

## 2012-03-13 ENCOUNTER — Emergency Department (HOSPITAL_COMMUNITY)
Admission: EM | Admit: 2012-03-13 | Discharge: 2012-03-13 | Disposition: A | Discharge status: Home / Self Care | Payer: 59 | Attending: Emergency Medicine | Admitting: Emergency Medicine

## 2012-03-13 ENCOUNTER — Encounter (HOSPITAL_COMMUNITY): Payer: Self-pay | Admitting: *Deleted

## 2012-03-13 DIAGNOSIS — E119 Type 2 diabetes mellitus without complications: Secondary | ICD-10-CM

## 2012-03-13 DIAGNOSIS — Z79899 Other long term (current) drug therapy: Secondary | ICD-10-CM | POA: Insufficient documentation

## 2012-03-13 DIAGNOSIS — I10 Essential (primary) hypertension: Secondary | ICD-10-CM | POA: Insufficient documentation

## 2012-03-13 DIAGNOSIS — E0500 Thyrotoxicosis with diffuse goiter without thyrotoxic crisis or storm: Secondary | ICD-10-CM | POA: Insufficient documentation

## 2012-03-13 DIAGNOSIS — D649 Anemia, unspecified: Secondary | ICD-10-CM | POA: Insufficient documentation

## 2012-03-13 DIAGNOSIS — Z87891 Personal history of nicotine dependence: Secondary | ICD-10-CM | POA: Insufficient documentation

## 2012-03-13 DIAGNOSIS — R7309 Other abnormal glucose: Secondary | ICD-10-CM | POA: Insufficient documentation

## 2012-03-13 DIAGNOSIS — I2510 Atherosclerotic heart disease of native coronary artery without angina pectoris: Secondary | ICD-10-CM | POA: Insufficient documentation

## 2012-03-13 DIAGNOSIS — K7460 Unspecified cirrhosis of liver: Secondary | ICD-10-CM | POA: Insufficient documentation

## 2012-03-13 HISTORY — DX: Atherosclerotic heart disease of native coronary artery without angina pectoris: I25.10

## 2012-03-13 HISTORY — DX: Unspecified cirrhosis of liver: K74.60

## 2012-03-13 HISTORY — DX: Anemia, unspecified: D64.9

## 2012-03-13 HISTORY — DX: Tachycardia, unspecified: R00.0

## 2012-03-13 HISTORY — DX: Essential (primary) hypertension: I10

## 2012-03-13 HISTORY — DX: Disorder of thyroid, unspecified: E07.9

## 2012-03-13 LAB — BASIC METABOLIC PANEL
BUN: 8 mg/dL (ref 6–23)
CO2: 28 mEq/L (ref 19–32)
Chloride: 88 mEq/L — ABNORMAL LOW (ref 96–112)
Creatinine, Ser: 0.71 mg/dL (ref 0.50–1.10)
GFR calc Af Amer: >90 mL/min (ref >90)
Glucose, Bld: 540 mg/dL — ABNORMAL HIGH (ref 70–99)
Potassium: 3.9 mEq/L (ref 3.5–5.1)

## 2012-03-13 LAB — CBC WITH DIFFERENTIAL/PLATELET
Basophils Absolute: 0 10*3/uL (ref 0.0–0.1)
Basophils Relative: 0 % (ref 0–1)
Eosinophils Absolute: 0.1 10*3/uL (ref 0.0–0.7)
Eosinophils Relative: 3 % (ref 0–5)
HCT: 37.2 % (ref 36.0–46.0)
Hemoglobin: 13.9 g/dL (ref 12.0–15.0)
Lymphocytes Relative: 28 % (ref 12–46)
Lymphs Abs: 0.9 10*3/uL (ref 0.7–4.0)
MCH: 31.4 pg (ref 26.0–34.0)
MCHC: 37.4 g/dL — ABNORMAL HIGH (ref 30.0–36.0)
MCV: 84.2 fL (ref 78.0–100.0)
Monocytes Absolute: 0.2 10*3/uL (ref 0.1–1.0)
Monocytes Relative: 5 % (ref 3–12)
Neutro Abs: 2.1 10*3/uL (ref 1.7–7.7)
Neutrophils Relative %: 65 % (ref 43–77)
Platelets: 99 10*3/uL — ABNORMAL LOW (ref 150–400)
RBC: 4.42 MIL/uL (ref 3.87–5.11)
RDW: 13.3 % (ref 11.5–15.5)
WBC: 3.2 10*3/uL — ABNORMAL LOW (ref 4.0–10.5)

## 2012-03-13 LAB — GLUCOSE, CAPILLARY: Glucose-Capillary: 535 mg/dL — ABNORMAL HIGH (ref 70–99)

## 2012-03-13 MED ORDER — METFORMIN HCL 500 MG PO TABS: 500.0000 mg | ORAL_TABLET | Freq: Two times a day (BID) | ORAL | Status: DC

## 2012-03-13 MED ORDER — SODIUM CHLORIDE 0.9 % IV BOLUS (SEPSIS)
1000.0000 mL | Freq: Once | INTRAVENOUS | Status: AC
  Administered 2012-03-13: 1000 mL via INTRAVENOUS

## 2012-03-13 NOTE — ED Notes (Signed)
Pt was having eye exam Blood sugar was checked and was over 600.  Has felt tired, No known diabetes

## 2012-03-13 NOTE — ED Provider Notes (Signed)
History  This chart was scribed for Jessica Hutching, MD by Shari Heritage. The patient was seen in room APA03/APA03. Patient's care was started at 1232.     CSN: [id number]  Arrival date & time 03/13/12  1134   First MD Initiated Contact with Patient 03/13/12 1232      Chief Complaint  Patient presents with  . Hyperglycemia     The history is provided by the patient. No language interpreter was used.   Jessica Dunlap is a 56 y.o. female who presents to the Emergency Department complaining of hyperglycemia. Associated symptoms include generalized weakness and fatigue, polydipsia and polyuria. Patient also states that she has lost 10 lbs in the past 2 months. Patient says that her blood was tested during her eye exam this morning and her blood glucose level ranged 550-600. Patient has a history of Stage IV non-alcoholic cirrhosis, Grave's disease, HTN, thyroid disease, coronary artery disease, tachycardia and anemia. Patient is taking Synthroid 225 micrograms daily. Her surgical history includes appendectomy and abdominal hysterectomy.   PCP - Sherwood Gambler  Past Medical History  Diagnosis Date  . Cirrhosis, non-alcoholic   . Hypertension   . Thyroid disease   . Coronary artery disease   . Tachycardia   . Anemia     Past Surgical History  Procedure Date  . Appendectomy   . Abdominal hysterectomy     History reviewed. No pertinent family history.  History  Substance Use Topics  . Smoking status: Former Games developer  . Smokeless tobacco: Not on file  . Alcohol Use: No    OB History    Grav Para Term Preterm Abortions TAB SAB Ect Mult Living                  Review of Systems A complete 10 system review of systems was obtained and all systems are negative except as noted in the HPI and PMH.   Allergies  Review of patient's allergies indicates no known allergies.  Home Medications  No current outpatient prescriptions on file.  BP 137/57  Pulse 64  Temp 98 F (36.7 C) (Oral)  Resp  20  Ht 5\' 3"  (1.6 m)  Wt 131 lb (59.421 kg)  BMI 23.21 kg/m2  SpO2 100%  Physical Exam  Nursing note and vitals reviewed. Constitutional: She is oriented to person, place, and time. She appears well-developed and well-nourished.  HENT:  Head: Normocephalic and atraumatic.  Eyes: Conjunctivae normal and EOM are normal. Pupils are equal, round, and reactive to light.  Neck: Normal range of motion. Neck supple.  Cardiovascular: Normal rate, regular rhythm and normal heart sounds.   Pulmonary/Chest: Effort normal and breath sounds normal.  Abdominal: Soft. Bowel sounds are normal.       Protuberant abdomen likely due to liver disease.  Musculoskeletal: Normal range of motion.  Neurological: She is alert and oriented to person, place, and time.  Skin: Skin is warm and dry.  Psychiatric: She has a normal mood and affect.    ED Course  Procedures (including critical care time) DIAGNOSTIC STUDIES: Oxygen Saturation is 100% on room air, normal by my interpretation.    COORDINATION OF CARE: 12:41pm- Patient informed of current plan for treatment and evaluation and agrees with plan at this time. Will order 1 L of IV fluids. Waiting on results of CBC and basic metabolic panel.   Results for orders placed during the hospital encounter of 03/13/12  GLUCOSE, CAPILLARY      Component Value Range  Glucose-Capillary 535 (*) 70 - 99 mg/dL   Comment 1 Documented in Chart    CBC WITH DIFFERENTIAL      Component Value Range   WBC 3.2 (*) 4.0 - 10.5 K/uL   RBC 4.42  3.87 - 5.11 MIL/uL   Hemoglobin 13.9  12.0 - 15.0 g/dL   HCT 81.1  91.4 - 78.2 %   MCV 84.2  78.0 - 100.0 fL   MCH 31.4  26.0 - 34.0 pg   MCHC 37.4 (*) 30.0 - 36.0 g/dL   RDW 95.6  21.3 - 08.6 %   Platelets 99 (*) 150 - 400 K/uL   Neutrophils Relative 65  43 - 77 %   Neutro Abs 2.1  1.7 - 7.7 K/uL   Lymphocytes Relative 28  12 - 46 %   Lymphs Abs 0.9  0.7 - 4.0 K/uL   Monocytes Relative 5  3 - 12 %   Monocytes Absolute 0.2   0.1 - 1.0 K/uL   Eosinophils Relative 3  0 - 5 %   Eosinophils Absolute 0.1  0.0 - 0.7 K/uL   Basophils Relative 0  0 - 1 %   Basophils Absolute 0.0  0.0 - 0.1 K/uL  BASIC METABOLIC PANEL      Component Value Range   Sodium 126 (*) 135 - 145 mEq/L   Potassium 3.9  3.5 - 5.1 mEq/L   Chloride 88 (*) 96 - 112 mEq/L   CO2 28  19 - 32 mEq/L   Glucose, Bld 540 (*) 70 - 99 mg/dL   BUN 8  6 - 23 mg/dL   Creatinine, Ser 5.78  0.50 - 1.10 mg/dL   Calcium 9.7  8.4 - 46.9 mg/dL   GFR calc non Af Amer >90  >90 mL/min   GFR calc Af Amer >90  >90 mL/min  GLUCOSE, CAPILLARY      Component Value Range   Glucose-Capillary 373 (*) 70 - 99 mg/dL    No results found.   No diagnosis found.    MDM  Elevated glucose discovered today by accident at eye doctor's office.  Patient is hemodynamically stable. Not in DKA. Will start metformin 500 mg twice a day. She has primary care followup on Tuesday.      I personally performed the services described in this documentation, which was scribed in my presence. The recorded information has been reviewed and considered.    Jessica Hutching, MD 03/13/12 [phone]

## 2012-05-14 ENCOUNTER — Telehealth (HOSPITAL_COMMUNITY): Payer: Self-pay | Admitting: Dietician

## 2012-05-14 NOTE — Telephone Encounter (Signed)
Pt registered for diabetes class on 05/14/12 at 6:30 PM. Pt was a no-show for class.

## 2012-08-10 ENCOUNTER — Other Ambulatory Visit (HOSPITAL_COMMUNITY): Payer: Self-pay | Admitting: Internal Medicine

## 2012-08-10 DIAGNOSIS — Z0000 Encounter for general adult medical examination without abnormal findings: Secondary | ICD-10-CM

## 2012-08-17 ENCOUNTER — Ambulatory Visit (HOSPITAL_COMMUNITY): Payer: 59

## 2012-10-05 DIAGNOSIS — E139 Other specified diabetes mellitus without complications: Secondary | ICD-10-CM | POA: Insufficient documentation

## 2013-01-18 ENCOUNTER — Ambulatory Visit (HOSPITAL_COMMUNITY): Payer: 59

## 2013-05-13 ENCOUNTER — Other Ambulatory Visit (HOSPITAL_COMMUNITY): Payer: Self-pay | Admitting: Internal Medicine

## 2013-05-13 DIAGNOSIS — Z0000 Encounter for general adult medical examination without abnormal findings: Secondary | ICD-10-CM

## 2013-05-13 DIAGNOSIS — Z139 Encounter for screening, unspecified: Secondary | ICD-10-CM

## 2013-05-24 ENCOUNTER — Ambulatory Visit (HOSPITAL_COMMUNITY): Payer: 59

## 2015-09-15 ENCOUNTER — Other Ambulatory Visit: Payer: Self-pay | Admitting: Internal Medicine

## 2015-09-15 DIAGNOSIS — Z1231 Encounter for screening mammogram for malignant neoplasm of breast: Secondary | ICD-10-CM

## 2015-09-21 ENCOUNTER — Other Ambulatory Visit (HOSPITAL_COMMUNITY): Payer: Self-pay | Admitting: Internal Medicine

## 2015-09-21 DIAGNOSIS — Z1231 Encounter for screening mammogram for malignant neoplasm of breast: Secondary | ICD-10-CM

## 2015-09-25 ENCOUNTER — Ambulatory Visit (HOSPITAL_COMMUNITY): Payer: 59

## 2015-10-12 ENCOUNTER — Ambulatory Visit (HOSPITAL_COMMUNITY): Payer: 59

## 2016-07-16 ENCOUNTER — Other Ambulatory Visit (HOSPITAL_COMMUNITY): Payer: Self-pay | Admitting: Internal Medicine

## 2016-07-16 DIAGNOSIS — Z1231 Encounter for screening mammogram for malignant neoplasm of breast: Secondary | ICD-10-CM

## 2016-08-01 ENCOUNTER — Ambulatory Visit (HOSPITAL_COMMUNITY): Payer: 59

## 2016-08-08 ENCOUNTER — Ambulatory Visit (HOSPITAL_COMMUNITY): Payer: 59

## 2016-12-30 ENCOUNTER — Other Ambulatory Visit (HOSPITAL_COMMUNITY): Payer: Self-pay | Admitting: Internal Medicine

## 2016-12-30 DIAGNOSIS — E2839 Other primary ovarian failure: Secondary | ICD-10-CM

## 2017-01-16 ENCOUNTER — Other Ambulatory Visit (HOSPITAL_COMMUNITY): Payer: 59

## 2017-01-16 ENCOUNTER — Encounter (HOSPITAL_COMMUNITY): Payer: Self-pay

## 2017-02-26 ENCOUNTER — Other Ambulatory Visit (HOSPITAL_COMMUNITY): Payer: Self-pay | Admitting: Internal Medicine

## 2017-02-26 DIAGNOSIS — E2839 Other primary ovarian failure: Secondary | ICD-10-CM

## 2017-03-06 ENCOUNTER — Ambulatory Visit (HOSPITAL_COMMUNITY)
Admission: RE | Admit: 2017-03-06 | Discharge: 2017-03-06 | Disposition: A | Discharge status: Home / Self Care | Payer: 59 | Source: Ambulatory Visit | Attending: Internal Medicine | Admitting: Internal Medicine

## 2017-03-06 DIAGNOSIS — M818 Other osteoporosis without current pathological fracture: Secondary | ICD-10-CM | POA: Insufficient documentation

## 2017-03-06 DIAGNOSIS — E2839 Other primary ovarian failure: Secondary | ICD-10-CM | POA: Diagnosis present

## 2017-11-20 ENCOUNTER — Encounter: Payer: Self-pay | Admitting: Adult Health

## 2017-12-02 ENCOUNTER — Other Ambulatory Visit: Payer: Self-pay

## 2017-12-02 ENCOUNTER — Encounter (HOSPITAL_COMMUNITY): Payer: Self-pay

## 2017-12-02 ENCOUNTER — Encounter (HOSPITAL_COMMUNITY)
Admission: RE | Admit: 2017-12-02 | Discharge: 2017-12-02 | Disposition: A | Discharge status: Home / Self Care | Payer: 59 | Source: Ambulatory Visit | Attending: Ophthalmology | Admitting: Ophthalmology

## 2017-12-02 DIAGNOSIS — Z01810 Encounter for preprocedural cardiovascular examination: Secondary | ICD-10-CM | POA: Diagnosis not present

## 2017-12-02 HISTORY — DX: Major depressive disorder, single episode, unspecified: F32.9

## 2017-12-02 HISTORY — DX: Hypothyroidism, unspecified: E03.9

## 2017-12-02 HISTORY — DX: Type 2 diabetes mellitus without complications: E11.9

## 2017-12-02 HISTORY — DX: Depression, unspecified: F32.A

## 2017-12-02 HISTORY — DX: Polyneuropathy, unspecified: G62.9

## 2017-12-02 HISTORY — DX: Malignant (primary) neoplasm, unspecified: C80.1

## 2017-12-02 LAB — CBC WITH DIFFERENTIAL/PLATELET
Basophils Absolute: 0 10*3/uL (ref 0.0–0.1)
Basophils Relative: 0 %
EOS ABS: 0.3 10*3/uL (ref 0.0–0.7)
EOS PCT: 5 %
HCT: 39.1 % (ref 36.0–46.0)
HEMOGLOBIN: 13.8 g/dL (ref 12.0–15.0)
LYMPHS ABS: 1.1 10*3/uL (ref 0.7–4.0)
LYMPHS PCT: 23 %
MCH: 32.1 pg (ref 26.0–34.0)
MCHC: 35.3 g/dL (ref 30.0–36.0)
MCV: 90.9 fL (ref 78.0–100.0)
MONOS PCT: 8 %
Monocytes Absolute: 0.4 10*3/uL (ref 0.1–1.0)
Neutro Abs: 3.1 10*3/uL (ref 1.7–7.7)
Neutrophils Relative %: 64 %
PLATELETS: 114 10*3/uL — AB (ref 150–400)
RBC: 4.3 MIL/uL (ref 3.87–5.11)
RDW: 14.2 % (ref 11.5–15.5)
WBC: 4.9 10*3/uL (ref 4.0–10.5)

## 2017-12-02 LAB — BASIC METABOLIC PANEL
Anion gap: 15 (ref 5–15)
BUN: 14 mg/dL (ref 6–20)
CHLORIDE: 95 mmol/L — AB (ref 101–111)
CO2: 24 mmol/L (ref 22–32)
CREATININE: 0.72 mg/dL (ref 0.44–1.00)
Calcium: 9.5 mg/dL (ref 8.9–10.3)
GFR calc Af Amer: >60 mL/min (ref >60)
GFR calc non Af Amer: >60 mL/min (ref >60)
GLUCOSE: 206 mg/dL — AB (ref 65–99)
Potassium: 3.8 mmol/L (ref 3.5–5.1)
Sodium: 134 mmol/L — ABNORMAL LOW (ref 135–145)

## 2017-12-02 LAB — GLUCOSE, CAPILLARY: GLUCOSE-CAPILLARY: 209 mg/dL — AB (ref 65–99)

## 2017-12-02 LAB — HEMOGLOBIN A1C
HEMOGLOBIN A1C: 6 % — AB (ref 4.8–5.6)
Mean Plasma Glucose: 125.5 mg/dL

## 2017-12-02 LAB — SURGICAL PCR SCREEN
MRSA, PCR: POSITIVE — AB
Staphylococcus aureus: POSITIVE — AB

## 2017-12-02 NOTE — Patient Instructions (Signed)
Your procedure is scheduled on: 12/08/2017   Report to [facility] at   900  AM.  Call this number if you have problems the morning of surgery: [phone]   Do not eat food or drink liquids :After Midnight.      Take these medicines the morning of surgery with A SIP OF WATER: emetrol, tegretol, diltiazem, dilaudid, levothyroxine, metoprolol.   Do not wear jewelry, make-up or nail polish.  Do not wear lotions, powders, or perfumes. You may wear deodorant.  Do not shave 48 hours prior to surgery.  Do not bring valuables to the hospital.  Contacts, dentures or bridgework may not be worn into surgery.  Leave suitcase in the car. After surgery it may be brought to your room.  For patients admitted to the hospital, checkout time is 11:00 AM the day of discharge.   Patients discharged the day of surgery will not be allowed to drive home.  :     Please read over the following fact sheets that you were given: Coughing and Deep Breathing, Surgical Site Infection Prevention, Anesthesia Post-op Instructions and Care and Recovery After Surgery    Cataract A cataract is a clouding of the lens of the eye. When a lens becomes cloudy, vision is reduced based on the degree and nature of the clouding. Many cataracts reduce vision to some degree. Some cataracts make people more near-sighted as they develop. Other cataracts increase glare. Cataracts that are ignored and become worse can sometimes look white. The white color can be seen through the pupil. CAUSES   Aging. However, cataracts may occur at any age, even in newborns.   Certain drugs.   Trauma to the eye.   Certain diseases such as diabetes.   Specific eye diseases such as chronic inflammation inside the eye or a sudden attack of a rare form of glaucoma.   Inherited or acquired medical problems.  SYMPTOMS   Gradual, progressive drop in vision in the affected eye.   Severe, rapid visual loss. This most often happens when trauma is the  cause.  DIAGNOSIS  To detect a cataract, an eye doctor examines the lens. Cataracts are best diagnosed with an exam of the eyes with the pupils enlarged (dilated) by drops.  TREATMENT  For an early cataract, vision may improve by using different eyeglasses or stronger lighting. If that does not help your vision, surgery is the only effective treatment. A cataract needs to be surgically removed when vision loss interferes with your everyday activities, such as driving, reading, or watching TV. A cataract may also have to be removed if it prevents examination or treatment of another eye problem. Surgery removes the cloudy lens and usually replaces it with a substitute lens (intraocular lens, IOL).  At a time when both you and your doctor agree, the cataract will be surgically removed. If you have cataracts in both eyes, only one is usually removed at a time. This allows the operated eye to heal and be out of danger from any possible problems after surgery (such as infection or poor wound healing). In rare cases, a cataract may be doing damage to your eye. In these cases, your caregiver may advise surgical removal right away. The vast majority of people who have cataract surgery have better vision afterward. HOME CARE INSTRUCTIONS  If you are not planning surgery, you may be asked to do the following:  Use different eyeglasses.   Use stronger or brighter lighting.   Ask your eye  doctor about reducing your medicine dose or changing medicines if it is thought that a medicine caused your cataract. Changing medicines does not make the cataract go away on its own.   Become familiar with your surroundings. Poor vision can lead to injury. Avoid bumping into things on the affected side. You are at a higher risk for tripping or falling.   Exercise extreme care when driving or operating machinery.   Wear sunglasses if you are sensitive to bright light or experiencing problems with glare.  SEEK IMMEDIATE  MEDICAL CARE IF:   You have a worsening or sudden vision loss.   You notice redness, swelling, or increasing pain in the eye.   You have a fever.  Document Released: 06/17/2005 Document Revised: 06/06/2011 Document Reviewed: 02/08/2011 [facility] Patient Information 2012 [place].PATIENT INSTRUCTIONS POST-ANESTHESIA  IMMEDIATELY FOLLOWING SURGERY:  Do not drive or operate machinery for the first twenty four hours after surgery.  Do not make any important decisions for twenty four hours after surgery or while taking narcotic pain medications or sedatives.  If you develop intractable nausea and vomiting or a severe headache please notify your doctor immediately.  FOLLOW-UP:  Please make an appointment with your surgeon as instructed. You do not need to follow up with anesthesia unless specifically instructed to do so.  WOUND CARE INSTRUCTIONS (if applicable):  Keep a dry clean dressing on the anesthesia/puncture wound site if there is drainage.  Once the wound has quit draining you may leave it open to air.  Generally you should leave the bandage intact for twenty four hours unless there is drainage.  If the epidural site drains for more than 36-48 hours please call the anesthesia department.  QUESTIONS?:  Please feel free to call your physician or the hospital operator if you have any questions, and they will be happy to assist you.

## 2017-12-03 NOTE — Pre-Procedure Instructions (Signed)
HgbA1C routed to PCP. 

## 2017-12-08 ENCOUNTER — Other Ambulatory Visit: Payer: Self-pay

## 2017-12-08 ENCOUNTER — Ambulatory Visit (HOSPITAL_COMMUNITY): Payer: 59 | Admitting: Anesthesiology

## 2017-12-08 ENCOUNTER — Ambulatory Visit (HOSPITAL_COMMUNITY)
Admission: RE | Admit: 2017-12-08 | Discharge: 2017-12-08 | Disposition: A | Discharge status: Home / Self Care | Payer: 59 | Source: Ambulatory Visit | Attending: Ophthalmology | Admitting: Ophthalmology

## 2017-12-08 ENCOUNTER — Encounter (HOSPITAL_COMMUNITY)
Admission: RE | Disposition: A | Discharge status: Home / Self Care | Payer: Self-pay | Source: Ambulatory Visit | Attending: Ophthalmology

## 2017-12-08 ENCOUNTER — Encounter (HOSPITAL_COMMUNITY): Payer: Self-pay | Admitting: Emergency Medicine

## 2017-12-08 DIAGNOSIS — H25812 Combined forms of age-related cataract, left eye: Secondary | ICD-10-CM | POA: Diagnosis not present

## 2017-12-08 DIAGNOSIS — Z87891 Personal history of nicotine dependence: Secondary | ICD-10-CM | POA: Diagnosis not present

## 2017-12-08 DIAGNOSIS — I2510 Atherosclerotic heart disease of native coronary artery without angina pectoris: Secondary | ICD-10-CM | POA: Insufficient documentation

## 2017-12-08 DIAGNOSIS — I10 Essential (primary) hypertension: Secondary | ICD-10-CM | POA: Diagnosis not present

## 2017-12-08 HISTORY — PX: CATARACT EXTRACTION W/PHACO: SHX586

## 2017-12-08 LAB — GLUCOSE, CAPILLARY
Glucose-Capillary: 240 mg/dL — ABNORMAL HIGH (ref 65–99)
Glucose-Capillary: 241 mg/dL — ABNORMAL HIGH (ref 65–99)

## 2017-12-08 SURGERY — PHACOEMULSIFICATION, CATARACT, WITH IOL INSERTION
Anesthesia: Monitor Anesthesia Care | Site: Eye | Laterality: Left

## 2017-12-08 MED ORDER — PHENYLEPHRINE HCL 2.5 % OP SOLN
1.0000 [drp] | OPHTHALMIC | Status: AC
  Administered 2017-12-08 (×3): 1 [drp] via OPHTHALMIC

## 2017-12-08 MED ORDER — LIDOCAINE HCL 3.5 % OP GEL
1.0000 "application " | Freq: Once | OPHTHALMIC | Status: AC
  Administered 2017-12-08: 1 via OPHTHALMIC

## 2017-12-08 MED ORDER — LIDOCAINE 3.5 % OP GEL OPTIME - NO CHARGE
OPHTHALMIC | Status: DC | PRN
  Administered 2017-12-08: 1 [drp] via OPHTHALMIC

## 2017-12-08 MED ORDER — EPINEPHRINE PF 1 MG/ML IJ SOLN
INTRAOCULAR | Status: DC | PRN
  Administered 2017-12-08: .9 mL via OPHTHALMIC

## 2017-12-08 MED ORDER — NEOMYCIN-POLYMYXIN-DEXAMETH 3.5-10000-0.1 OP SUSP
OPHTHALMIC | Status: DC | PRN
  Administered 2017-12-08: 2 [drp] via OPHTHALMIC

## 2017-12-08 MED ORDER — MIDAZOLAM HCL 5 MG/5ML IJ SOLN
INTRAMUSCULAR | Status: DC | PRN
  Administered 2017-12-08: 2 mg via INTRAVENOUS

## 2017-12-08 MED ORDER — LACTATED RINGERS IV SOLN
INTRAVENOUS | Status: DC
  Administered 2017-12-08: 09:00:00 via INTRAVENOUS

## 2017-12-08 MED ORDER — TETRACAINE HCL 0.5 % OP SOLN
1.0000 [drp] | OPHTHALMIC | Status: AC
  Administered 2017-12-08 (×3): 1 [drp] via OPHTHALMIC

## 2017-12-08 MED ORDER — BSS IO SOLN
INTRAOCULAR | Status: DC | PRN
  Administered 2017-12-08: 15 mL

## 2017-12-08 MED ORDER — CYCLOPENTOLATE-PHENYLEPHRINE 0.2-1 % OP SOLN
1.0000 [drp] | OPHTHALMIC | Status: AC
  Administered 2017-12-08 (×3): 1 [drp] via OPHTHALMIC

## 2017-12-08 MED ORDER — BSS IO SOLN
INTRAOCULAR | Status: DC | PRN
  Administered 2017-12-08: 500 mL

## 2017-12-08 MED ORDER — PROVISC 10 MG/ML IO SOLN
INTRAOCULAR | Status: DC | PRN
  Administered 2017-12-08: 0.85 mL via INTRAOCULAR

## 2017-12-08 MED ORDER — POVIDONE-IODINE 5 % OP SOLN
OPHTHALMIC | Status: DC | PRN
  Administered 2017-12-08: 1 via OPHTHALMIC

## 2017-12-08 SURGICAL SUPPLY — 13 items
CLOTH BEACON ORANGE TIMEOUT ST (SAFETY) ×2 IMPLANT
EYE SHIELD UNIVERSAL CLEAR (GAUZE/BANDAGES/DRESSINGS) ×2 IMPLANT
GLOVE BIOGEL PI IND STRL 6.5 (GLOVE) IMPLANT
GLOVE BIOGEL PI IND STRL 7.0 (GLOVE) IMPLANT
GLOVE BIOGEL PI INDICATOR 6.5 (GLOVE) ×4
GLOVE BIOGEL PI INDICATOR 7.0 (GLOVE) ×2
LENS ALC ACRYL/TECN (Ophthalmic Related) ×2 IMPLANT
PAD ARMBOARD 7.5X6 YLW CONV (MISCELLANEOUS) ×2 IMPLANT
SYRINGE LUER LOK 1CC (MISCELLANEOUS) ×2 IMPLANT
TAPE SURG TRANSPORE 1 IN (GAUZE/BANDAGES/DRESSINGS) IMPLANT
TAPE SURGICAL TRANSPORE 1 IN (GAUZE/BANDAGES/DRESSINGS) ×2
VISCOELASTIC ADDITIONAL (OPHTHALMIC RELATED) ×2 IMPLANT
WATER STERILE IRR 250ML POUR (IV SOLUTION) ×2 IMPLANT

## 2017-12-08 NOTE — Op Note (Signed)
Date of Admission: 12/08/2017  Date of Surgery: 12/08/2017  Pre-Op Dx: Cataract Left  Eye  Post-Op Dx: Senile Combined Cataract  Left  Eye,  Dx Code B63.893  Surgeon: Tonny Branch, M.D.  Assistants: None  Anesthesia: Topical with MAC  Indications: Painless, progressive loss of vision with compromise of daily activities.  Surgery: Cataract Extraction with Intraocular lens Implant Left Eye  Discription: The patient had dilating drops and viscous lidocaine placed into the Left eye in the pre-op holding area. After transfer to the operating room, a time out was performed. The patient was then prepped and draped. Beginning with a 78m blade a paracentesis port was made at the surgeon's 2 o'clock position. The anterior chamber was then filled with 1% non-preserved lidocaine. This was followed by filling the anterior chamber with Provisc.  A 2.434mkeratome blade was used to make a clear corneal incision at the temporal limbus.  A bent cystatome needle was used to create a continuous tear capsulotomy. Hydrodissection was performed with balanced salt solution on a Fine canula. The lens nucleus was then removed using the phacoemulsification handpiece. Residual cortex was removed with the I&A handpiece. The anterior chamber and capsular bag were refilled with Provisc. A posterior chamber intraocular lens was placed into the capsular bag with it's injector. The implant was positioned with the Kuglan hook. The Provisc was then removed from the anterior chamber and capsular bag with the I&A handpiece. Stromal hydration of the main incision and paracentesis port was performed with BSS on a Fine canula. The wounds were tested for leak which was negative. The patient tolerated the procedure well. There were no operative complications. The patient was then transferred to the recovery room in stable condition.  Complications: None  Specimen: None  EBL: None  Prosthetic device: J&J Technis, PCB00, power 22.5, SN  [id number]

## 2017-12-08 NOTE — Transfer of Care (Signed)
Immediate Anesthesia Transfer of Care Note  Patient: Jessica Dunlap  Procedure(s) Performed: CATARACT EXTRACTION PHACO AND INTRAOCULAR LENS PLACEMENT LEFT EYE (Left Eye)  Patient Location: PACU  Anesthesia Type:MAC  Level of Consciousness: awake and alert   Airway & Oxygen Therapy: Patient Spontanous Breathing  Post-op Assessment: Report given to RN, Post -op Vital signs reviewed and stable and Patient moving all extremities  Post vital signs: Reviewed and stable  Last Vitals:  Vitals Value Taken Time  BP    Temp    Pulse    Resp    SpO2      Last Pain:  Vitals:   12/08/17 0908  TempSrc: Oral  PainSc: 6          Complications: No apparent anesthesia complications

## 2017-12-08 NOTE — H&P (Signed)
I have reviewed the H&P, the patient was re-examined, and I have identified no interval changes in medical condition and plan of care since the history and physical of record  

## 2017-12-08 NOTE — Anesthesia Preprocedure Evaluation (Signed)
Anesthesia Evaluation  Patient identified by MRN, date of birth, ID band Patient awake    Reviewed: Allergy & Precautions, H&P , NPO status , Patient's Chart, lab work & pertinent test results, reviewed documented beta blocker date and time   Airway Mallampati: II  TM Distance: >3 FB Neck ROM: full    Dental no notable dental hx. (+) Edentulous Lower, Edentulous Upper   Pulmonary neg pulmonary ROS, former smoker,    Pulmonary exam normal breath sounds clear to auscultation       Cardiovascular Exercise Tolerance: Good hypertension, On Medications + CAD  negative cardio ROS   Rhythm:regular Rate:Normal     Neuro/Psych Depression negative neurological ROS  negative psych ROS   GI/Hepatic negative GI ROS, Neg liver ROS,   Endo/Other  negative endocrine ROSdiabetes, Poorly Controlled, Type 2Hypothyroidism   Renal/GU negative Renal ROS  negative genitourinary   Musculoskeletal   Abdominal   Peds  Hematology negative hematology ROS (+)   Anesthesia Other Findings 3 yr h/o DM-II  Marginally controlled FBS in preop 230 EKG in NSR at 86   Reproductive/Obstetrics negative OB ROS                             Anesthesia Physical Anesthesia Plan  ASA: III  Anesthesia Plan: MAC   Post-op Pain Management:    Induction:   PONV Risk Score and Plan:   Airway Management Planned:   Additional Equipment:   Intra-op Plan:   Post-operative Plan:   Informed Consent: I have reviewed the patients History and Physical, chart, labs and discussed the procedure including the risks, benefits and alternatives for the proposed anesthesia with the patient or authorized representative who has indicated his/her understanding and acceptance.   Dental Advisory Given  Plan Discussed with: CRNA and Anesthesiologist  Anesthesia Plan Comments:         Anesthesia Quick Evaluation

## 2017-12-08 NOTE — Anesthesia Postprocedure Evaluation (Signed)
Anesthesia Post Note  Patient: Jessica Dunlap  Procedure(s) Performed: CATARACT EXTRACTION PHACO AND INTRAOCULAR LENS PLACEMENT LEFT EYE (Left Eye)  Patient location during evaluation: Short Stay Anesthesia Type: MAC Level of consciousness: awake and patient cooperative Pain management: pain level controlled Vital Signs Assessment: post-procedure vital signs reviewed and stable Respiratory status: spontaneous breathing, nonlabored ventilation and respiratory function stable Cardiovascular status: blood pressure returned to baseline Postop Assessment: no apparent nausea or vomiting Anesthetic complications: no     Last Vitals:  Vitals:   12/08/17 0908 12/08/17 1000  BP: (!) 150/68 (!) 154/69  Pulse: 93 92  Resp: 17   Temp: 37.1 C   SpO2: 99% 98%    Last Pain:  Vitals:   12/08/17 0908  TempSrc: Oral  PainSc: 6                  [name]

## 2017-12-09 ENCOUNTER — Encounter (HOSPITAL_COMMUNITY): Payer: Self-pay | Admitting: Ophthalmology

## 2017-12-12 MED ORDER — LACTATED RINGERS IV SOLN: INTRAVENOUS | Status: DC

## 2017-12-12 MED ORDER — HYDROCODONE-ACETAMINOPHEN 7.5-325 MG PO TABS: 1.0000 | ORAL_TABLET | Freq: Once | ORAL | Status: DC | PRN

## 2017-12-12 MED ORDER — MEPERIDINE HCL 50 MG/ML IJ SOLN: 6.2500 mg | INTRAMUSCULAR | Status: DC | PRN

## 2017-12-12 MED ORDER — HYDROMORPHONE HCL 1 MG/ML IJ SOLN: 0.2500 mg | INTRAMUSCULAR | Status: DC | PRN

## 2017-12-12 MED ORDER — PROMETHAZINE HCL 25 MG/ML IJ SOLN: 6.2500 mg | INTRAMUSCULAR | Status: DC | PRN

## 2017-12-15 ENCOUNTER — Encounter (HOSPITAL_COMMUNITY): Payer: Self-pay

## 2017-12-15 ENCOUNTER — Encounter (HOSPITAL_COMMUNITY)
Admission: RE | Admit: 2017-12-15 | Discharge: 2017-12-15 | Disposition: A | Discharge status: Home / Self Care | Payer: 59 | Source: Ambulatory Visit | Attending: Ophthalmology | Admitting: Ophthalmology

## 2017-12-22 ENCOUNTER — Ambulatory Visit (HOSPITAL_COMMUNITY): Payer: 59 | Admitting: Anesthesiology

## 2017-12-22 ENCOUNTER — Encounter (HOSPITAL_COMMUNITY)
Admission: RE | Disposition: A | Discharge status: Home / Self Care | Payer: Self-pay | Source: Ambulatory Visit | Attending: Ophthalmology

## 2017-12-22 ENCOUNTER — Other Ambulatory Visit: Payer: Self-pay

## 2017-12-22 ENCOUNTER — Encounter (HOSPITAL_COMMUNITY): Payer: Self-pay | Admitting: *Deleted

## 2017-12-22 ENCOUNTER — Ambulatory Visit (HOSPITAL_COMMUNITY)
Admission: RE | Admit: 2017-12-22 | Discharge: 2017-12-22 | Disposition: A | Discharge status: Home / Self Care | Payer: 59 | Source: Ambulatory Visit | Attending: Ophthalmology | Admitting: Ophthalmology

## 2017-12-22 DIAGNOSIS — F329 Major depressive disorder, single episode, unspecified: Secondary | ICD-10-CM | POA: Diagnosis not present

## 2017-12-22 DIAGNOSIS — Z79899 Other long term (current) drug therapy: Secondary | ICD-10-CM | POA: Diagnosis not present

## 2017-12-22 DIAGNOSIS — H25811 Combined forms of age-related cataract, right eye: Secondary | ICD-10-CM | POA: Diagnosis not present

## 2017-12-22 DIAGNOSIS — I2510 Atherosclerotic heart disease of native coronary artery without angina pectoris: Secondary | ICD-10-CM | POA: Diagnosis not present

## 2017-12-22 DIAGNOSIS — Z87891 Personal history of nicotine dependence: Secondary | ICD-10-CM | POA: Insufficient documentation

## 2017-12-22 DIAGNOSIS — I10 Essential (primary) hypertension: Secondary | ICD-10-CM | POA: Insufficient documentation

## 2017-12-22 DIAGNOSIS — K7460 Unspecified cirrhosis of liver: Secondary | ICD-10-CM | POA: Diagnosis not present

## 2017-12-22 HISTORY — PX: CATARACT EXTRACTION W/PHACO: SHX586

## 2017-12-22 LAB — GLUCOSE, CAPILLARY: GLUCOSE-CAPILLARY: 200 mg/dL — AB (ref 65–99)

## 2017-12-22 SURGERY — PHACOEMULSIFICATION, CATARACT, WITH IOL INSERTION
Anesthesia: Monitor Anesthesia Care | Site: Eye | Laterality: Right

## 2017-12-22 MED ORDER — LACTATED RINGERS IV SOLN
INTRAVENOUS | Status: DC
  Administered 2017-12-22: 11:00:00 via INTRAVENOUS

## 2017-12-22 MED ORDER — PROVISC 10 MG/ML IO SOLN
INTRAOCULAR | Status: DC | PRN
  Administered 2017-12-22: 0.85 mL via INTRAOCULAR

## 2017-12-22 MED ORDER — PHENYLEPHRINE HCL 2.5 % OP SOLN
1.0000 [drp] | OPHTHALMIC | Status: AC
  Administered 2017-12-22 (×3): 1 [drp] via OPHTHALMIC

## 2017-12-22 MED ORDER — CYCLOPENTOLATE-PHENYLEPHRINE 0.2-1 % OP SOLN
1.0000 [drp] | OPHTHALMIC | Status: AC
  Administered 2017-12-22 (×3): 1 [drp] via OPHTHALMIC

## 2017-12-22 MED ORDER — LIDOCAINE HCL 3.5 % OP GEL
1.0000 "application " | Freq: Once | OPHTHALMIC | Status: AC
  Administered 2017-12-22: 1 via OPHTHALMIC

## 2017-12-22 MED ORDER — BSS IO SOLN
INTRAOCULAR | Status: DC | PRN
  Administered 2017-12-22: 500 mL

## 2017-12-22 MED ORDER — NEOMYCIN-POLYMYXIN-DEXAMETH 3.5-10000-0.1 OP SUSP
OPHTHALMIC | Status: DC | PRN
  Administered 2017-12-22: 2 [drp] via OPHTHALMIC

## 2017-12-22 MED ORDER — BSS IO SOLN
INTRAOCULAR | Status: DC | PRN
  Administered 2017-12-22: 15 mL

## 2017-12-22 MED ORDER — POVIDONE-IODINE 5 % OP SOLN
OPHTHALMIC | Status: DC | PRN
  Administered 2017-12-22: 1 via OPHTHALMIC

## 2017-12-22 MED ORDER — TETRACAINE HCL 0.5 % OP SOLN
1.0000 [drp] | OPHTHALMIC | Status: AC
  Administered 2017-12-22 (×3): 1 [drp] via OPHTHALMIC

## 2017-12-22 MED ORDER — LIDOCAINE HCL (PF) 1 % IJ SOLN
INTRAOCULAR | Status: DC | PRN
  Administered 2017-12-22: .8 mL via OPHTHALMIC

## 2017-12-22 MED ORDER — MIDAZOLAM HCL 5 MG/5ML IJ SOLN
INTRAMUSCULAR | Status: DC | PRN
  Administered 2017-12-22: 2 mg via INTRAVENOUS

## 2017-12-22 SURGICAL SUPPLY — 10 items

## 2017-12-22 NOTE — Transfer of Care (Signed)
Immediate Anesthesia Transfer of Care Note  Patient: Jessica Dunlap  Procedure(s) Performed: CATARACT EXTRACTION PHACO AND INTRAOCULAR LENS PLACEMENT RIGHT EYE (Right Eye)  Patient Location: Short Stay  Anesthesia Type:MAC  Level of Consciousness: awake, alert , oriented and patient cooperative  Airway & Oxygen Therapy: Patient Spontanous Breathing  Post-op Assessment: Report given to RN and Post -op Vital signs reviewed and stable  Post vital signs: Reviewed and stable  Last Vitals:  Vitals Value Taken Time  BP    Temp    Pulse    Resp    SpO2      Last Pain:  Vitals:   12/22/17 1117  TempSrc: Oral  PainSc: 0-No pain      Patients Stated Pain Goal: 7 (75/[id number]/63 [id number])  Complications: No apparent anesthesia complications

## 2017-12-22 NOTE — H&P (Signed)
I have reviewed the H&P, the patient was re-examined, and I have identified no interval changes in medical condition and plan of care since the history and physical of record  

## 2017-12-22 NOTE — Anesthesia Preprocedure Evaluation (Signed)
Anesthesia Evaluation  Patient identified by MRN, date of birth, ID band Patient awake    Reviewed: Allergy & Precautions, H&P , NPO status , Patient's Chart, lab work & pertinent test results, reviewed documented beta blocker date and time   Airway Mallampati: II  TM Distance: >3 FB Neck ROM: full    Dental no notable dental hx. (+) Edentulous Lower, Edentulous Upper   Pulmonary neg pulmonary ROS, former smoker,    Pulmonary exam normal breath sounds clear to auscultation       Cardiovascular Exercise Tolerance: Good hypertension, On Medications + CAD  negative cardio ROS   Rhythm:regular Rate:Normal     Neuro/Psych PSYCHIATRIC DISORDERS Depression negative neurological ROS  negative psych ROS   GI/Hepatic negative GI ROS, Neg liver ROS, (+) Cirrhosis       , Cirrhosis, non-alcoholic    Endo/Other  negative endocrine ROSdiabetes, Poorly Controlled, Type 2Hypothyroidism   Renal/GU      Musculoskeletal   Abdominal   Peds  Hematology negative hematology ROS (+) anemia ,   Anesthesia Other Findings 3 yr h/o DM-II  Marginally controlled FBS in preop 230 EKG in NSR at 86   Reproductive/Obstetrics negative OB ROS                             Anesthesia Physical Anesthesia Plan  ASA: III  Anesthesia Plan: MAC   Post-op Pain Management:    Induction:   PONV Risk Score and Plan:   Airway Management Planned:   Additional Equipment:   Intra-op Plan:   Post-operative Plan:   Informed Consent:   Plan Discussed with: CRNA  Anesthesia Plan Comments:         Anesthesia Quick Evaluation

## 2017-12-22 NOTE — Discharge Instructions (Signed)
Monitored Anesthesia Care, Care After  These instructions provide you with information about caring for yourself after your procedure. Your health care provider may also give you more specific instructions. Your treatment has been planned according to current medical practices, but problems sometimes occur. Call your health care provider if you have any problems or questions after your procedure.  What can I expect after the procedure?  After your procedure, it is common to:   Feel sleepy for several hours.   Feel clumsy and have poor balance for several hours.   Feel forgetful about what happened after the procedure.   Have poor judgment for several hours.   Feel nauseous or vomit.   Have a sore throat if you had a breathing tube during the procedure.    Follow these instructions at home:  For at least 24 hours after the procedure:     Do not:  ? Participate in activities in which you could fall or become injured.  ? Drive.  ? Use heavy machinery.  ? Drink alcohol.  ? Take sleeping pills or medicines that cause drowsiness.  ? Make important decisions or sign legal documents.  ? Take care of children on your own.   Rest.  Eating and drinking   Follow the diet that is recommended by your health care provider.   If you vomit, drink water, juice, or soup when you can drink without vomiting.   Make sure you have little or no nausea before eating solid foods.  General instructions   Have a responsible adult stay with you until you are awake and alert.   Take over-the-counter and prescription medicines only as told by your health care provider.   If you smoke, do not smoke without supervision.   Keep all follow-up visits as told by your health care provider. This is important.  Contact a health care provider if:   You keep feeling nauseous or you keep vomiting.   You feel light-headed.   You develop a rash.   You have a fever.  Get help right away if:   You have trouble breathing.  This information is  not intended to replace advice given to you by your health care provider. Make sure you discuss any questions you have with your health care provider.  Document Released: 10/08/2015 Document Revised: 02/07/2016 Document Reviewed: 10/08/2015  Elsevier Interactive Patient Education  2018 [employer]

## 2017-12-22 NOTE — Anesthesia Procedure Notes (Signed)
Procedure Name: MAC Date/Time: 12/22/2017 12:04 PM Performed by: Andree Elk Richard Holz A, CRNA Pre-anesthesia Checklist: Patient identified, Timeout performed, Emergency Drugs available, Suction available and Patient being monitored Oxygen Delivery Method: Nasal cannula

## 2017-12-22 NOTE — Op Note (Signed)
Date of Admission: 12/22/2017  Date of Surgery: 12/22/2017  Pre-Op Dx: Cataract Right  Eye  Post-Op Dx: Senile Combined Cataract  Right  Eye,  Dx Code Z76.734  Surgeon: Tonny Branch, M.D.  Assistants: None  Anesthesia: Topical with MAC  Indications: Painless, progressive loss of vision with compromise of daily activities.  Surgery: Cataract Extraction with Intraocular lens Implant Right Eye  Discription: The patient had dilating drops and viscous lidocaine placed into the Right eye in the pre-op holding area. After transfer to the operating room, a time out was performed. The patient was then prepped and draped. Beginning with a 70m blade a paracentesis port was made at the surgeon's 2 o'clock position. The anterior chamber was then filled with 1% non-preserved lidocaine. This was followed by filling the anterior chamber with Provisc.  A 2.49mkeratome blade was used to make a clear corneal incision at the temporal limbus.  A bent cystatome needle was used to create a continuous tear capsulotomy. Hydrodissection was performed with balanced salt solution on a Fine canula. The lens nucleus was then removed using the phacoemulsification handpiece. Residual cortex was removed with the I&A handpiece. The anterior chamber and capsular bag were refilled with Provisc. A posterior chamber intraocular lens was placed into the capsular bag with it's injector. The implant was positioned with the Kuglan hook. The Provisc was then removed from the anterior chamber and capsular bag with the I&A handpiece. Stromal hydration of the main incision and paracentesis port was performed with BSS on a Fine canula. The wounds were tested for leak which was negative. The patient tolerated the procedure well. There were no operative complications. The patient was then transferred to the recovery room in stable condition.  Complications: None  Specimen: None  EBL: None  Prosthetic device: J&J Technis, PCB00, power 23.0,  SN [id number]

## 2017-12-22 NOTE — Anesthesia Postprocedure Evaluation (Signed)
Anesthesia Post Note  Patient: Jessica Dunlap  Procedure(s) Performed: CATARACT EXTRACTION PHACO AND INTRAOCULAR LENS PLACEMENT RIGHT EYE (Right Eye)  Patient location during evaluation: Short Stay Anesthesia Type: MAC Level of consciousness: awake and alert and oriented Pain management: pain level controlled Vital Signs Assessment: post-procedure vital signs reviewed and stable Respiratory status: spontaneous breathing Cardiovascular status: stable Postop Assessment: no apparent nausea or vomiting Anesthetic complications: no     Last Vitals:  Vitals:   12/22/17 1117 12/22/17 1122  BP: 135/78   Pulse: 78 80  Resp: 16   Temp: 36.7 C   SpO2: 97%     Last Pain:  Vitals:   12/22/17 1117  TempSrc: Oral  PainSc: 0-No pain                 ADAMS, AMY A

## 2017-12-23 ENCOUNTER — Encounter (HOSPITAL_COMMUNITY): Payer: Self-pay | Admitting: Ophthalmology

## 2018-02-25 ENCOUNTER — Encounter (HOSPITAL_COMMUNITY): Payer: Self-pay

## 2018-02-25 ENCOUNTER — Emergency Department (HOSPITAL_COMMUNITY)
Admission: EM | Admit: 2018-02-25 | Discharge: 2018-02-25 | Disposition: A | Discharge status: Home / Self Care | Payer: 59 | Attending: Emergency Medicine | Admitting: Emergency Medicine

## 2018-02-25 ENCOUNTER — Emergency Department (HOSPITAL_COMMUNITY): Payer: 59

## 2018-02-25 DIAGNOSIS — Y939 Activity, unspecified: Secondary | ICD-10-CM | POA: Insufficient documentation

## 2018-02-25 DIAGNOSIS — E119 Type 2 diabetes mellitus without complications: Secondary | ICD-10-CM | POA: Diagnosis not present

## 2018-02-25 DIAGNOSIS — Z79899 Other long term (current) drug therapy: Secondary | ICD-10-CM | POA: Diagnosis not present

## 2018-02-25 DIAGNOSIS — Z7982 Long term (current) use of aspirin: Secondary | ICD-10-CM | POA: Diagnosis not present

## 2018-02-25 DIAGNOSIS — Y999 Unspecified external cause status: Secondary | ICD-10-CM | POA: Diagnosis not present

## 2018-02-25 DIAGNOSIS — I10 Essential (primary) hypertension: Secondary | ICD-10-CM | POA: Insufficient documentation

## 2018-02-25 DIAGNOSIS — Z87891 Personal history of nicotine dependence: Secondary | ICD-10-CM | POA: Insufficient documentation

## 2018-02-25 DIAGNOSIS — Y92008 Other place in unspecified non-institutional (private) residence as the place of occurrence of the external cause: Secondary | ICD-10-CM | POA: Insufficient documentation

## 2018-02-25 DIAGNOSIS — L03116 Cellulitis of left lower limb: Secondary | ICD-10-CM | POA: Diagnosis not present

## 2018-02-25 DIAGNOSIS — S8012XA Contusion of left lower leg, initial encounter: Secondary | ICD-10-CM | POA: Diagnosis not present

## 2018-02-25 DIAGNOSIS — W010XXA Fall on same level from slipping, tripping and stumbling without subsequent striking against object, initial encounter: Secondary | ICD-10-CM | POA: Diagnosis not present

## 2018-02-25 DIAGNOSIS — S8992XA Unspecified injury of left lower leg, initial encounter: Secondary | ICD-10-CM | POA: Diagnosis present

## 2018-02-25 DIAGNOSIS — I2510 Atherosclerotic heart disease of native coronary artery without angina pectoris: Secondary | ICD-10-CM | POA: Insufficient documentation

## 2018-02-25 DIAGNOSIS — Z8541 Personal history of malignant neoplasm of cervix uteri: Secondary | ICD-10-CM | POA: Insufficient documentation

## 2018-02-25 DIAGNOSIS — E039 Hypothyroidism, unspecified: Secondary | ICD-10-CM | POA: Insufficient documentation

## 2018-02-25 LAB — BASIC METABOLIC PANEL
Anion gap: 13 (ref 5–15)
BUN: 11 mg/dL (ref 8–23)
CALCIUM: 9 mg/dL (ref 8.9–10.3)
CO2: 27 mmol/L (ref 22–32)
Chloride: 95 mmol/L — ABNORMAL LOW (ref 98–111)
Creatinine, Ser: 0.79 mg/dL (ref 0.44–1.00)
GFR calc Af Amer: >60 mL/min (ref >60)
GLUCOSE: 184 mg/dL — AB (ref 70–99)
Potassium: 4.4 mmol/L (ref 3.5–5.1)
Sodium: 135 mmol/L (ref 135–145)

## 2018-02-25 LAB — CBC WITH DIFFERENTIAL/PLATELET
BASOS ABS: 0 10*3/uL (ref 0.0–0.1)
BASOS PCT: 0 %
EOS ABS: 0.2 10*3/uL (ref 0.0–0.7)
EOS PCT: 4 %
HCT: 35.6 % — ABNORMAL LOW (ref 36.0–46.0)
Hemoglobin: 12.9 g/dL (ref 12.0–15.0)
Lymphocytes Relative: 24 %
Lymphs Abs: 1.2 10*3/uL (ref 0.7–4.0)
MCH: 33.1 pg (ref 26.0–34.0)
MCHC: 36.2 g/dL — ABNORMAL HIGH (ref 30.0–36.0)
MCV: 91.3 fL (ref 78.0–100.0)
MONO ABS: 0.4 10*3/uL (ref 0.1–1.0)
Monocytes Relative: 8 %
Neutro Abs: 3.3 10*3/uL (ref 1.7–7.7)
Neutrophils Relative %: 64 %
PLATELETS: 137 10*3/uL — AB (ref 150–400)
RBC: 3.9 MIL/uL (ref 3.87–5.11)
RDW: 13.9 % (ref 11.5–15.5)
WBC: 5.1 10*3/uL (ref 4.0–10.5)

## 2018-02-25 MED ORDER — DOXYCYCLINE HYCLATE 100 MG PO TABS: 100.0000 mg | ORAL_TABLET | Freq: Two times a day (BID) | ORAL | 0 refills | Status: DC

## 2018-02-25 NOTE — ED Notes (Addendum)
Pt states she tripped and fell x 2 days ago landing on her left knee; pt states she initially had some swelling to her left knee but since then she has had swelling to lower leg and ankle with redness and hot to the touch to her shin; pt has pedal pulses in both feet, checked by dopplar

## 2018-02-25 NOTE — Discharge Instructions (Signed)
Take the prescription as directed.  Apply moist heat or ice to the area(s) of discomfort, for 15 minutes at a time, several times per day for the next few days.  Do not fall asleep on a heating or ice pack.  Elevate your left leg  as much as possible. Call your regular medical doctor tomorrow to schedule a follow up appointment this week.  Return to the Emergency Department immediately if worsening.

## 2018-02-25 NOTE — ED Triage Notes (Signed)
Pt reports that she lost balance and fell Monday due to neuropathy. Initially her left knee was bothering her, now since  last night her left lower leg is red, warm and swollen

## 2018-02-25 NOTE — ED Provider Notes (Signed)
[facility] EMERGENCY DEPARTMENT Provider Note   CSN: [id number] Arrival date & time: 02/25/18  1807     History   Chief Complaint Chief Complaint  Patient presents with  . Leg Pain    HPI Jessica Dunlap is a 62 y.o. female.  HPI  Pt was seen at 2135. Per pt and her family, c/o gradual onset and persistence of constant left knee "pain" after she fell 2 days ago. Pt states she tripped on her porch, falling onto her left knee. Pt states initially her left knee "swelled" but as that improved her left anterior lower leg and ankle "swelled." Pt states her right shin area is "red" and "warm" now. Denies new fall, no focal motor weakness, no tingling/numbness in extremities, no fevers, no hip pain, no back pain, no head injury, no neck pain, no CP/SOB, no abd pain, no N/V/D, no calf pain or swelling.    Past Medical History:  Diagnosis Date  . Anemia   . Cancer (HCC)    cervical  . Cirrhosis, non-alcoholic ([place])   . Coronary artery disease   . Depression   . Diabetes mellitus without complication ([place])   . Hypertension   . Hypothyroidism   . Neuropathy   . Tachycardia   . Thyroid disease     There are no active problems to display for this patient.   Past Surgical History:  Procedure Laterality Date  . ABDOMINAL HYSTERECTOMY    . APPENDECTOMY    . CATARACT EXTRACTION W/PHACO Left 12/08/2017   Procedure: CATARACT EXTRACTION PHACO AND INTRAOCULAR LENS PLACEMENT LEFT EYE;  Surgeon: Tonny Branch, MD;  Location: [facility];  Service: Ophthalmology;  Laterality: Left;  CDE: 10.09  . CATARACT EXTRACTION W/PHACO Right 12/22/2017   Procedure: CATARACT EXTRACTION PHACO AND INTRAOCULAR LENS PLACEMENT RIGHT EYE;  Surgeon: Tonny Branch, MD;  Location: [facility];  Service: Ophthalmology;  Laterality: Right;  CDE: 14.20     OB History   None      Home Medications    Prior to Admission medications   Medication Sig Start Date End Date Taking? Authorizing Provider  alendronate (FOSAMAX) 70 MG  tablet Take 70 mg by mouth once a week. Take with a full glass of water on an empty stomach.    [provider]  anti-nausea (EMETROL) solution Take 10 mLs by mouth as needed for nausea or vomiting.    [provider]  aspirin EC 81 MG tablet Take 81 mg by mouth daily.    [provider]  canagliflozin (INVOKANA) 100 MG TABS tablet Take 100 mg by mouth daily.    [provider]  carbamazepine (TEGRETOL) 100 MG chewable tablet Chew 100 mg by mouth 2 (two) times daily. 11/03/17   [provider]  diltiazem (TIAZAC) 120 MG 24 hr capsule Take 120 mg by mouth daily. 10/31/17   [provider]  DULoxetine (CYMBALTA) 60 MG capsule Take 60 mg by mouth at bedtime.    [provider]  furosemide (LASIX) 20 MG tablet Take 20 mg by mouth daily.    [provider]  glipiZIDE (GLUCOTROL) 10 MG tablet Take 10 mg by mouth daily.    [provider]  HYDROmorphone (DILAUDID) 8 MG tablet Take 16 mg by mouth 3 (three) times daily as needed for moderate pain.     [provider]  ibuprofen (ADVIL,MOTRIN) 200 MG tablet Take 400 mg by mouth daily as needed for headache or moderate pain.    [provider]  levothyroxine (SYNTHROID, LEVOTHROID) 200 MCG tablet Take 200 mcg by mouth at bedtime. Take one daily along with 68mcg tablets for total dose of 225 mcg.    [provider]  levothyroxine (SYNTHROID, LEVOTHROID) 25 MCG tablet Take 25 mcg by mouth at bedtime. Take one tablet along with 245mcg tablet for total dose of 225mcg.    [provider]  metFORMIN (GLUCOPHAGE) 500 MG tablet Take 1 tablet (500 mg total) by mouth 2 (two) times daily with a meal. Patient taking differently: Take 1,000 mg by mouth 2 (two) times daily with a meal.  03/13/12 11/28/17  Nat Christen, MD  metoprolol (LOPRESSOR) 50 MG tablet Take 50 mg by mouth 2 (two) times daily.    [provider]  Morphine Sulfate ER 60 MG T12A Take 120  mg by mouth at bedtime.    [provider]  Polyethyl Glycol-Propyl Glycol (SYSTANE OP) Place 1 drop into both eyes 3 (three) times daily as needed (dry eyes).    [provider]  spironolactone (ALDACTONE) 25 MG tablet Take 50 mg by mouth daily.    [provider]  traZODone (DESYREL) 150 MG tablet Take 150 mg by mouth at bedtime. For sleep    [provider]  vitamin E 400 UNIT capsule Take 400 Units by mouth 2 (two) times daily.    [provider]    Family History No family history on file.  Social History Social History   Tobacco Use  . Smoking status: Former Smoker    Packs/day: 1.50    Years: 30.00    Pack years: 45.00    Types: Cigarettes    Last attempt to quit: 12/03/2007    Years since quitting: 10.2  . Smokeless tobacco: Never Used  Substance Use Topics  . Alcohol use: No  . Drug use: No     Allergies   Patient has no known allergies.   Review of Systems Review of Systems ROS: Statement: All systems negative except as marked or noted in the HPI; Constitutional: Negative for fever and chills. ; ; Eyes: Negative for eye pain, redness and discharge. ; ; ENMT: Negative for ear pain, hoarseness, nasal congestion, sinus pressure and sore throat. ; ; Cardiovascular: Negative for chest pain, palpitations, diaphoresis, dyspnea and peripheral edema. ; ; Respiratory: Negative for cough, wheezing and stridor. ; ; Gastrointestinal: Negative for nausea, vomiting, diarrhea, abdominal pain, blood in stool, hematemesis, jaundice and rectal bleeding. . ; ; Genitourinary: Negative for dysuria, flank pain and hematuria. ; ; Musculoskeletal: Negative for back pain and neck pain. Negative for deformity.; ; Skin: +rash, bruising. Negative for pruritus, abrasions, blisters, and skin lesion.; ; Neuro: Negative for headache, lightheadedness and neck stiffness. Negative for weakness, altered level of consciousness, altered mental status, extremity  weakness, paresthesias, involuntary movement, seizure and syncope.       Physical Exam Updated Vital Signs BP (!) 147/65 (BP Location: Right Arm)   Pulse 81   Temp 98.2 F (36.8 C) (Oral)   Resp 12   Ht 5\' 3"  (1.6 m)   Wt 62.6 kg   SpO2 96%   BMI 24.45 kg/m   Physical Exam 2140: Physical examination:  Nursing notes reviewed; Vital signs and O2 SAT reviewed;  Constitutional: Well developed, Well nourished, Well hydrated, In no acute distress; Head:  Normocephalic, atraumatic; Eyes: EOMI, PERRL, No scleral icterus; ENMT: Mouth and pharynx normal, Mucous membranes moist; Neck: Supple, Full range of motion, No lymphadenopathy; Cardiovascular: Regular rate  and rhythm, No gallop; Respiratory: Breath sounds clear & equal bilaterally, No wheezes.  Speaking full sentences with ease, Normal respiratory effort/excursion; Chest: Nontender, Movement normal; Abdomen: Soft, Nontender, Nondistended, Normal bowel sounds; Genitourinary: No CVA tenderness; Extremities: Peripheral pulses normal, NT left hip/foot/toes. +FROM left knee, including able to lift extended left LE off stretcher, and extend left lower leg against resistance.  No ligamentous laxity.  No patellar or quad tendon step-offs.  NMS intact left foot, strong pedal pp. +plantarflexion of left foot w/calf squeeze.  No palpable gap left Achilles's tendon.  No proximal fibular head tenderness.  No left knee or ankle edema, erythema, warmth, ecchymosis or deformity. +healing superficial abrasion with scab to left patellar area. +erythema, ecchymosis and warmth to left anterior tibial area, no open wounds. No edema, No calf tenderness, edema or asymmetry.; Neuro: AA&Ox3, Major CN grossly intact.  Speech clear. No gross focal motor or sensory deficits in extremities. Climbs on and off stretcher easily by herself. Gait steady..; Skin: Color normal, Warm, Dry.   ED Treatments / Results  Labs (all labs ordered are listed, but only abnormal results are  displayed)   EKG None  Radiology   Procedures Procedures (including critical care time)  Medications Ordered in ED Medications - No data to display   Initial Impression / Assessment and Plan / ED Course  I have reviewed the triage vital signs and the nursing notes.  Pertinent labs & imaging results that were available during my care of the patient were reviewed by me and considered in my medical decision making (see chart for details).  MDM Reviewed: previous chart, nursing note and vitals Reviewed previous: labs Interpretation: labs and x-ray   Results for orders placed or performed during the hospital encounter of 38/18/29  Basic metabolic panel  Result Value Ref Range   Sodium 135 135 - 145 mmol/L   Potassium 4.4 3.5 - 5.1 mmol/L   Chloride 95 (L) 98 - 111 mmol/L   CO2 27 22 - 32 mmol/L   Glucose, Bld 184 (H) 70 - 99 mg/dL   BUN 11 8 - 23 mg/dL   Creatinine, Ser 0.79 0.44 - 1.00 mg/dL   Calcium 9.0 8.9 - 10.3 mg/dL   GFR calc non Af Amer >60 >60 mL/min   GFR calc Af Amer >60 >60 mL/min   Anion gap 13 5 - 15  CBC with Differential  Result Value Ref Range   WBC 5.1 4.0 - 10.5 K/uL   RBC 3.90 3.87 - 5.11 MIL/uL   Hemoglobin 12.9 12.0 - 15.0 g/dL   HCT 35.6 (L) 36.0 - 46.0 %   MCV 91.3 78.0 - 100.0 fL   MCH 33.1 26.0 - 34.0 pg   MCHC 36.2 (H) 30.0 - 36.0 g/dL   RDW 13.9 11.5 - 15.5 %   Platelets 137 (L) 150 - 400 K/uL   Neutrophils Relative % 64 %   Neutro Abs 3.3 1.7 - 7.7 K/uL   Lymphocytes Relative 24 %   Lymphs Abs 1.2 0.7 - 4.0 K/uL   Monocytes Relative 8 %   Monocytes Absolute 0.4 0.1 - 1.0 K/uL   Eosinophils Relative 4 %   Eosinophils Absolute 0.2 0.0 - 0.7 K/uL   Basophils Relative 0 %   Basophils Absolute 0.0 0.0 - 0.1 K/uL   Dg Tibia/fibula Left Result Date: 02/25/2018 CLINICAL DATA:  Initial evaluation for acute pain, swelling, warmth, redness. EXAM: LEFT TIBIA AND FIBULA - 2 VIEW COMPARISON:  None. FINDINGS: No acute  fracture or dislocation.  Osseous mineralization within normal limits. Mild soft tissue swelling about the ankle. No other acute soft tissue abnormality. IMPRESSION: 1. No acute osseous abnormality about the left tibia/fibula. 2. Mild acute soft tissue swelling about the ankle. Electronically Signed   By: Jeannine Boga M.D.   On: 02/25/2018 22:32   Dg Ankle Complete Left Result Date: 02/25/2018 CLINICAL DATA:  Initial evaluation for acute pain, swelling with warmth and redness. EXAM: LEFT ANKLE COMPLETE - 3+ VIEW COMPARISON:  None. FINDINGS: No acute fracture or dislocation. Ankle mortise approximated. Talar dome intact. Posterior and plantar calcaneal enthesophytes noted. Mild diffuse soft tissue swelling about the ankle. IMPRESSION: 1. No acute osseous abnormality. 2. Mild diffuse soft tissue swelling about the ankle. Electronically Signed   By: Jeannine Boga M.D.   On: 02/25/2018 22:30   Dg Knee Complete 4 Views Left Result Date: 02/25/2018 CLINICAL DATA:  Initial evaluation for acute left anterior knee pain, recent fall. EXAM: LEFT KNEE - COMPLETE 4+ VIEW COMPARISON:  None. FINDINGS: No acute fracture or dislocation. No joint effusion. Joint spaces fairly well-maintained without evidence for significant degenerative or erosive arthropathy. No acute soft tissue abnormality. IMPRESSION: No acute osseous abnormality about the left knee. Electronically Signed   By: Jeannine Boga M.D.   On: 02/25/2018 22:29    2250:  Platelets per baseline. XR without fx. Tx for ecchymosis and possible cellulitis. Doubt DVT without calf tenderness/edema/asymmetry. Dx and testing d/w pt and family.  Questions answered.  Verb understanding, agreeable to d/c home with outpt f/u.   Final Clinical Impressions(s) / ED Diagnoses   Final diagnoses:  None    ED Discharge Orders    None       Francine Graven, DO 02/27/18 2320

## 2018-04-07 ENCOUNTER — Ambulatory Visit (INDEPENDENT_AMBULATORY_CARE_PROVIDER_SITE_OTHER): Payer: 59 | Admitting: Neurology

## 2018-04-07 ENCOUNTER — Encounter: Payer: Self-pay | Admitting: Neurology

## 2018-04-07 VITALS — BP 145/77 | HR 77 | Ht 63.0 in | Wt 133.0 lb

## 2018-04-07 DIAGNOSIS — Z9181 History of falling: Secondary | ICD-10-CM

## 2018-04-07 DIAGNOSIS — G629 Polyneuropathy, unspecified: Secondary | ICD-10-CM

## 2018-04-07 DIAGNOSIS — G3281 Cerebellar ataxia in diseases classified elsewhere: Secondary | ICD-10-CM

## 2018-04-07 DIAGNOSIS — R269 Unspecified abnormalities of gait and mobility: Secondary | ICD-10-CM | POA: Diagnosis not present

## 2018-04-07 NOTE — Progress Notes (Signed)
Subjective:    Patient ID: Jessica Dunlap is a 62 y.o. female.  HPI     Star Age, MD, PhD [facility] [address], [address] [place],  [address]  Dear Dr. Gerarda Fraction,   I saw your patient, Jessica Dunlap, upon your kind request in my neurologic clinic today for initial consultation of her gait disorder. The patient is accompanied by her husband today. History know, Jessica Dunlap a 62 year old right-handed woman with an underlying complex medical history of hypothyroidism, with Dx of Hashimoto's in the past, neuropathy, hypertension, diabetes, depression, coronary artery disease, liver cirrhosis, cervical cancer, anemia, chronic pain on chronic narcotic pain medication, who reports difficulty with her gait and balance. She has had recurrent falls. I reviewed your office note from 03/13/2018. She had blood work through your office at the time including CBC with differential, CMP, lipid panel, TSH, B12, vitamin D. I reviewed her blood test results. Total cholesterol was 132, triglycerides elevated at 385, LDL 27. Vitamin D was 25.2, B12 425. Of note, she recently presented to the emergency room at [facility] on 02/25/2018 after a fall and left leg pain. She had x-ray which did not show any fractures but soft tissue swelling. She was treated for cellulitis. She is not sure about her latest A1c. Of note, she is on multiple potentially sedating medications including MS Contin 120 mg each night, trazodone 150 mg each night, metoprolol, hydromorphone 8 mg 3 times a day, Cymbalta 60 mg daily, carbamazepine 100 mg twice daily. She does not use a walker and sometimes uses a cane. She lives with her husband and her son. She quit smoking and does not currently utilize alcohol. She reports ongoing left leg pain.  Her Past Medical History Is Significant For: Past Medical History:  Diagnosis Date  . Anemia   . Cancer (HCC)    cervical  . Cirrhosis, non-alcoholic ([place])   .  Coronary artery disease   . Depression   . Diabetes mellitus without complication ([place])   . Hypertension   . Hypothyroidism   . Neuropathy   . Tachycardia   . Thyroid disease     Her Past Surgical History Is Significant For: Past Surgical History:  Procedure Laterality Date  . ABDOMINAL HYSTERECTOMY    . APPENDECTOMY    . CATARACT EXTRACTION W/PHACO Left 12/08/2017   Procedure: CATARACT EXTRACTION PHACO AND INTRAOCULAR LENS PLACEMENT LEFT EYE;  Surgeon: Tonny Branch, MD;  Location: [facility];  Service: Ophthalmology;  Laterality: Left;  CDE: 10.09  . CATARACT EXTRACTION W/PHACO Right 12/22/2017   Procedure: CATARACT EXTRACTION PHACO AND INTRAOCULAR LENS PLACEMENT RIGHT EYE;  Surgeon: Tonny Branch, MD;  Location: [facility];  Service: Ophthalmology;  Laterality: Right;  CDE: 14.20    Her Family History Is Significant For: No family history on file.  Her Social History Is Significant For: Social History   Socioeconomic History  . Marital status: Married    Spouse name: Not on file  . Number of children: Not on file  . Years of education: Not on file  . Highest education level: Not on file  Occupational History  . Not on file  Social Needs  . Financial resource strain: Not on file  . Food insecurity:    Worry: Not on file    Inability: Not on file  . Transportation needs:    Medical: Not on file    Non-medical: Not on file  Tobacco Use  . Smoking status: Former  Smoker    Packs/day: 1.50    Years: 30.00    Pack years: 45.00    Types: Cigarettes    Last attempt to quit: 12/03/2007    Years since quitting: 10.3  . Smokeless tobacco: Never Used  Substance and Sexual Activity  . Alcohol use: No  . Drug use: No  . Sexual activity: Yes    Birth control/protection: Surgical  Lifestyle  . Physical activity:    Days per week: Not on file    Minutes per session: Not on file  . Stress: Not on file  Relationships  . Social connections:    Talks on phone: Not on file    Gets  together: Not on file    Attends religious service: Not on file    Active member of club or organization: Not on file    Attends meetings of clubs or organizations: Not on file    Relationship status: Not on file  Other Topics Concern  . Not on file  Social History Narrative  . Not on file    Her Allergies Are:  No Known Allergies:   Her Current Medications Are:  Outpatient Encounter Medications as of 04/07/2018  Medication Sig  . alendronate (FOSAMAX) 70 MG tablet Take 70 mg by mouth once a week. Take with a full glass of water on an empty stomach.  Marland Kitchen anti-nausea (EMETROL) solution Take 10 mLs by mouth as needed for nausea or vomiting.  Marland Kitchen aspirin EC 81 MG tablet Take 81 mg by mouth daily.  . canagliflozin (INVOKANA) 100 MG TABS tablet Take 100 mg by mouth daily.  . carbamazepine (TEGRETOL) 100 MG chewable tablet Chew 100 mg by mouth 2 (two) times daily.  Marland Kitchen doxycycline (VIBRA-TABS) 100 MG tablet Take 1 tablet (100 mg total) by mouth 2 (two) times daily.  . DULoxetine (CYMBALTA) 60 MG capsule Take 60 mg by mouth at bedtime.  Marland Kitchen glipiZIDE (GLUCOTROL) 10 MG tablet Take 10 mg by mouth daily.  Marland Kitchen HYDROmorphone (DILAUDID) 8 MG tablet Take 16 mg by mouth 3 (three) times daily as needed for moderate pain.   Marland Kitchen ibuprofen (ADVIL,MOTRIN) 200 MG tablet Take 400 mg by mouth daily as needed for headache or moderate pain.  Marland Kitchen levothyroxine (SYNTHROID, LEVOTHROID) 200 MCG tablet Take 200 mcg by mouth at bedtime. Take one daily along with 2mcg tablets for total dose of 225 mcg.  . levothyroxine (SYNTHROID, LEVOTHROID) 25 MCG tablet Take 25 mcg by mouth at bedtime. Take one tablet along with 295mcg tablet for total dose of 238mcg.  . metoprolol (LOPRESSOR) 50 MG tablet Take 50 mg by mouth 2 (two) times daily.  . Morphine Sulfate ER 60 MG T12A Take 120 mg by mouth at bedtime.  Vladimir Faster Glycol-Propyl Glycol (SYSTANE OP) Place 1 drop into both eyes 3 (three) times daily as needed (dry eyes).  Marland Kitchen  spironolactone (ALDACTONE) 25 MG tablet Take 50 mg by mouth daily.  . traZODone (DESYREL) 150 MG tablet Take 150 mg by mouth at bedtime. For sleep  . vitamin E 400 UNIT capsule Take 400 Units by mouth 2 (two) times daily.  Marland Kitchen diltiazem (TIAZAC) 120 MG 24 hr capsule Take 120 mg by mouth daily.  . metFORMIN (GLUCOPHAGE) 500 MG tablet Take 1 tablet (500 mg total) by mouth 2 (two) times daily with a meal. (Patient taking differently: Take 1,000 mg by mouth 2 (two) times daily with a meal. )  . [DISCONTINUED] furosemide (LASIX) 20 MG tablet Take 20 mg by  mouth daily.   No facility-administered encounter medications on file as of 04/07/2018.   :   Review of Systems:  Out of a complete 14 point review of systems, all are reviewed and negative with the exception of these symptoms as listed below:  Review of Systems  Neurological:       Pt presents today to discuss her gait. Pt reports that she has fallen several times recently. Pt does not use a walking aid.    Objective:  Neurological Exam  Physical Exam Physical Examination:   Vitals:   04/07/18 1433  BP: (!) 145/77  Pulse: 77   General Examination: The patient is a very pleasant 62 y.o. female in no acute distress. She appears well-developed and well-nourished and well groomed. She is slow to move and responds slowly.   HEENT: Normocephalic, atraumatic, pupils are equal, round and reactive to light and accommodation. Extraocular tracking is good without limitation to gaze excursion or nystagmus noted. Normal smooth pursuit is noted. Hearing is grossly intact. Face is symmetric with normal facial animation and normal facial sensation. Speech is slow and scant. She has mouth dryness, tongue protrudes centrally and palate elevates symmetrically.   Chest: Clear to auscultation without wheezing, rhonchi or crackles noted.  Heart: S1+S2+0, regular and normal without murmurs, rubs or gallops noted.   Abdomen: Soft, non-tender and  non-distended with normal bowel sounds appreciated on auscultation.  Extremities: There is no pitting edema in the distal lower extremities bilaterally. Chronic discoloration noted in the distal lower extremities bilaterally.   Skin: Warm and dry without trophic changes noted.  Musculoskeletal: exam reveals no obvious joint deformities, tenderness or joint swelling or erythema.   Neurologically:  Mental status: The patient is awake, alert and oriented in all 4 spheres. Her immediate and remote memory, attention, language skills and fund of knowledge are appropriate. There is no evidence of aphasia, agnosia, apraxia or anomia. Speech is clear with normal prosody and enunciation. Thought process is linear. Mood is normal and affect is blunted.  Cranial nerves II - XII are as described above under HEENT exam. In addition: shoulder shrug is normal with equal shoulder height noted. Motor exam: Normal bulk, strength and tone is noted. There is no drift, tremor or rebound. Romberg is negative. 1+ in the upper extremities, trace in the knees and absent at the ankles. Fine motor skills and coordination: slow but overall intact with finger taps and hand movements and rapid alternating patting in the upper extremities and foot taps in the lower extremities.  Cerebellar testing: No dysmetria or intention tremor.  Sensory exam: intact in the upper extremities but decrease to vibration and temperature sense in the distal lower extremities up to mid shin areas. Gait, station and balance: She stands with difficulty and has to push herself up. She walks slowly and cautiously, no shuffling. She has preserved arm swing. Balance is impaired, she walks slightly wide-based. She did not bring a cane or walker, slight limp on the left.  Assessment and Plan:   In summary, Jessica Dunlap is a very pleasant 62 y.o.-year old female with an underlying complex medical history of hypothyroidism, with Dx of Hashimoto's in the past,  neuropathy, hypertension, diabetes, depression, coronary artery disease, liver cirrhosis, cervical cancer, anemia, chronic pain on chronic narcotic pain medication, who presents for evaluation of her gait disorder. On examination, she has a nonspecific gait imbalance problem, also some evidence of peripheral neuropathy, likely diabetic neuropathy. She reports a history of neuropathy for  at least one year. Her fall risk is in part likely due to taking numerous potentially very sedating medications including 2 different narcotic pain prescriptions. I am highly concerned about her medication list. In addition, she is also on Cymbalta and trazodone at night. She takes her MS Contin at night as well. For evaluation of her neuropathy I suggested we do a nerve conduction and EMG. We will schedule her for this. She is advised for prevention of neuropathy that optimal diabetes control will be key. She is advised to talk to about seeing orthopedics potentially for her left leg pain. She is advised to talk to about referral to physical therapy, she may benefit from using a walker. I will also go ahead and request a brain MRI to rule out a structural cause of her symptoms. Will keep her posted as to her test results. I would highly recommend reviewing her medication list and reducing her narcotic pain medication if possible and reducing potentially sedating medications or possible. I'm not sure why she is on carbamazepine.  I can see her back as needed, depending on her test results. We will call her or her husband with the test results. I answered all their questions today and the patient and her husband were in agreement.  Thank you very much for allowing me to participate in the care of this nice patient. If I can be of any further assistance to you please do not hesitate to call me at [phone].  Sincerely,   Star Age, MD, PhD

## 2018-04-07 NOTE — Patient Instructions (Addendum)
Your fall risk is higher likely from multiple factors, including taking multiple potentially sedating medications.  You have symptoms of neuropathy. You may have diabetic neuropathy.  Please talk to Dr. Gerarda Fraction about seeing orthopedic surgery with your left leg pain.  Please talk to him about physical therapy referral, you may need a walker.  I am not sure, that I can be of any significant help.  We will do a brain scan, called MRI and call you with the test results. We will have to schedule you for this on a separate date. This test requires authorization from your insurance, and we will take care of the insurance process. We will do an EMG and nerve conduction velocity test, which is an electrical nerve and muscle test, which we will schedule. We will call you with the results.

## 2018-04-08 ENCOUNTER — Telehealth: Payer: Self-pay | Admitting: Neurology

## 2018-04-08 NOTE — Telephone Encounter (Signed)
lvm for pt to be aware of this and I left their number of [phone] if they haven't heard in the next 2-3 days to call GI

## 2018-04-08 NOTE — Telephone Encounter (Signed)
[employer] order sent to GI. They obtain the auth and reach out to the pt to schedule.  °

## 2018-05-06 ENCOUNTER — Encounter: Payer: 59 | Admitting: Neurology

## 2018-06-17 ENCOUNTER — Other Ambulatory Visit: Payer: Self-pay | Admitting: Neurology

## 2018-06-17 DIAGNOSIS — M5002 Cervical disc disorder with myelopathy, mid-cervical region, unspecified level: Secondary | ICD-10-CM

## 2018-07-13 ENCOUNTER — Ambulatory Visit (HOSPITAL_COMMUNITY): Payer: 59

## 2018-07-20 ENCOUNTER — Encounter: Payer: 59 | Admitting: Adult Health

## 2018-08-27 ENCOUNTER — Ambulatory Visit (HOSPITAL_COMMUNITY)
Admission: RE | Admit: 2018-08-27 | Discharge: 2018-08-27 | Disposition: A | Discharge status: Home / Self Care | Payer: 59 | Source: Ambulatory Visit | Attending: Neurology | Admitting: Neurology

## 2018-08-27 DIAGNOSIS — M5002 Cervical disc disorder with myelopathy, mid-cervical region, unspecified level: Secondary | ICD-10-CM | POA: Diagnosis not present

## 2018-08-27 LAB — POCT I-STAT CREATININE: CREATININE: 0.6 mg/dL (ref 0.44–1.00)

## 2018-08-27 MED ORDER — GADOBUTROL 1 MMOL/ML IV SOLN
6.0000 mL | Freq: Once | INTRAVENOUS | Status: AC | PRN
  Administered 2018-08-27: 7.5 mL via INTRAVENOUS

## 2019-04-20 ENCOUNTER — Telehealth: Payer: Self-pay | Admitting: Adult Health

## 2019-04-20 NOTE — Telephone Encounter (Signed)

## 2019-04-21 ENCOUNTER — Encounter: Payer: 59 | Admitting: Adult Health

## 2019-09-13 ENCOUNTER — Other Ambulatory Visit: Payer: Self-pay | Admitting: Podiatry

## 2019-09-13 DIAGNOSIS — I739 Peripheral vascular disease, unspecified: Secondary | ICD-10-CM

## 2019-09-13 DIAGNOSIS — S91109D Unspecified open wound of unspecified toe(s) without damage to nail, subsequent encounter: Secondary | ICD-10-CM

## 2019-09-14 ENCOUNTER — Other Ambulatory Visit: Payer: Self-pay

## 2019-09-14 ENCOUNTER — Ambulatory Visit (HOSPITAL_COMMUNITY)
Admission: RE | Admit: 2019-09-14 | Discharge: 2019-09-14 | Disposition: A | Discharge status: Home / Self Care | Payer: 59 | Source: Ambulatory Visit | Attending: Podiatry | Admitting: Podiatry

## 2019-09-14 DIAGNOSIS — S91109D Unspecified open wound of unspecified toe(s) without damage to nail, subsequent encounter: Secondary | ICD-10-CM | POA: Insufficient documentation

## 2019-09-14 DIAGNOSIS — I739 Peripheral vascular disease, unspecified: Secondary | ICD-10-CM | POA: Diagnosis present

## 2019-12-31 ENCOUNTER — Encounter (HOSPITAL_BASED_OUTPATIENT_CLINIC_OR_DEPARTMENT_OTHER): Payer: 59 | Attending: Internal Medicine | Admitting: Internal Medicine

## 2019-12-31 DIAGNOSIS — E039 Hypothyroidism, unspecified: Secondary | ICD-10-CM | POA: Insufficient documentation

## 2019-12-31 DIAGNOSIS — D591 Autoimmune hemolytic anemia, unspecified: Secondary | ICD-10-CM | POA: Insufficient documentation

## 2019-12-31 DIAGNOSIS — Z87891 Personal history of nicotine dependence: Secondary | ICD-10-CM | POA: Diagnosis not present

## 2019-12-31 DIAGNOSIS — L97522 Non-pressure chronic ulcer of other part of left foot with fat layer exposed: Secondary | ICD-10-CM | POA: Diagnosis not present

## 2019-12-31 DIAGNOSIS — E1142 Type 2 diabetes mellitus with diabetic polyneuropathy: Secondary | ICD-10-CM | POA: Diagnosis not present

## 2019-12-31 DIAGNOSIS — I10 Essential (primary) hypertension: Secondary | ICD-10-CM | POA: Diagnosis not present

## 2019-12-31 DIAGNOSIS — E11621 Type 2 diabetes mellitus with foot ulcer: Secondary | ICD-10-CM | POA: Insufficient documentation

## 2019-12-31 DIAGNOSIS — E1136 Type 2 diabetes mellitus with diabetic cataract: Secondary | ICD-10-CM | POA: Diagnosis not present

## 2019-12-31 DIAGNOSIS — M21172 Varus deformity, not elsewhere classified, left ankle: Secondary | ICD-10-CM | POA: Insufficient documentation

## 2019-12-31 DIAGNOSIS — L97511 Non-pressure chronic ulcer of other part of right foot limited to breakdown of skin: Secondary | ICD-10-CM | POA: Diagnosis present

## 2019-12-31 NOTE — Progress Notes (Signed)
Jessica Dunlap ([id number]) Visit Report for 12/31/2019 Allergy List Details Patient Name: Date of Service: FA IN, Jessica Dunlap 12/31/2019 1:15 PM Medical Record Number: [id number] Patient Account Number: [id number] Date of Birth/Sex: Treating RN: [birth year] (64 y.o. Jessica Dunlap Primary Care Marleta Lapierre: [employer] Other Clinician: Referring Shirley Bolle: Treating Sabir Charters/Extender: Garfield Cornea in Treatment: 0 Allergies Active Allergies No Known Allergies Allergy Notes Electronic Signature(s) Signed: 12/31/2019 3:59:35 PM By: Baruch Gouty RN, BSN Entered By: Baruch Gouty on 12/31/2019 13:15:37 -------------------------------------------------------------------------------- Arrival Information Details Patient Name: Date of Service: FA IN, Jessica L. 12/31/2019 1:15 PM Medical Record Number: [id number] Patient Account Number: [id number] Date of Birth/Sex: Treating RN: 1956-04-01 (64 y.o. Jessica Dunlap Primary Care Gabreil Yon[place]: [employer] Other Clinician: Referring Keylani Perlstein: Treating Pranshu Lyster/Extender: Garfield Cornea in Treatment: 0 Visit Information Patient Arrived: Jessica Dunlap Time: 13:07 Accompanied By: sister Transfer Assistance: None Patient Identification Verified: Yes Secondary Verification Process Completed: Yes Patient Requires Transmission-Based Precautions: No Patient Has Alerts: Yes Patient Alerts: R ABI =.94 TBI = .93 L ABI= .95, TBI= .67 Electronic Signature(s) Signed: 12/31/2019 3:59:35 PM By: Baruch Gouty RN, BSN Entered By: Baruch Gouty on 12/31/2019 13:47:18 -------------------------------------------------------------------------------- Clinic Level of Care Assessment Details Patient Name: Date of Service: FA IN, Jessica L. 12/31/2019 1:15 PM Medical Record Number: [id number] Patient Account Number: [id number] Date of Birth/Sex: Treating RN: [birth year] (64 y.o. Clearnce Sorrel Primary Care Lilla Callejo:  [employer] Other Clinician: Referring Amantha Sklar: Treating Byrant Valent/Extender: Garfield Cornea in Treatment: 0 Clinic Level of Care Assessment Items TOOL 1 Quantity Score X- 1 0 Use when EandM and Procedure is performed on INITIAL visit ASSESSMENTS - Nursing Assessment / Reassessment X- 1 20 General Physical Exam (combine w/ comprehensive assessment (listed just below) when performed on new pt. evals) X- 1 25 Comprehensive Assessment (HX, ROS, Risk Assessments, Wounds Hx, etc.) ASSESSMENTS - Wound and Skin Assessment / Reassessment []  - 0 Dermatologic / Skin Assessment (not related to wound area) ASSESSMENTS - Ostomy and/or Continence Assessment and Care []  - 0 Incontinence Assessment and Management []  - 0 Ostomy Care Assessment and Management (repouching, etc.) PROCESS - Coordination of Care X - Simple Patient / Family Education for ongoing care 1 15 []  - 0 Complex (extensive) Patient / Family Education for ongoing care X- 1 10 Staff obtains Programmer, systems, Records, T Results / Process Orders est []  - 0 Staff telephones HHA, Nursing Homes / Clarify orders / etc []  - 0 Routine Transfer to another Facility (non-emergent condition) []  - 0 Routine Hospital Admission (non-emergent condition) X- 1 15 New Admissions / Biomedical engineer / Ordering NPWT Apligraf, etc. , []  - 0 Emergency Hospital Admission (emergent condition) PROCESS - Special Needs []  - 0 Pediatric / Minor Patient Management []  - 0 Isolation Patient Management []  - 0 Hearing / Language / Visual special needs []  - 0 Assessment of Community assistance (transportation, D/C planning, etc.) []  - 0 Additional assistance / Altered mentation []  - 0 Support Surface(s) Assessment (bed, cushion, seat, etc.) INTERVENTIONS - Miscellaneous []  - 0 External ear exam []  - 0 Patient Transfer (multiple staff / Civil Service fast streamer / Similar devices) []  - 0 Simple Staple / Suture removal (25 or  less) []  - 0 Complex Staple / Suture removal (26 or more) []  - 0 Hypo/Hyperglycemic Management (do not check if billed separately) []  - 0 Ankle / Brachial Index (ABI) - do not check if billed separately Has the patient been seen at the hospital within the  last three years: Yes Total Score: 85 Level Of Care: New/Established - Level 3 Electronic Signature(s) Signed: 12/31/2019 4:13:30 PM By: Kela Millin Entered By: Kela Millin on 12/31/2019 14:12:28 -------------------------------------------------------------------------------- Encounter Discharge Information Details Patient Name: Date of Service: FA IN, Jessica L. 12/31/2019 1:15 PM Medical Record Number: [id number] Patient Account Number: [id number] Date of Birth/Sex: Treating RN: 1956/06/30 (64 y.o. Jessica Dunlap Primary Care Berkley Cronkright: [employer] Other Clinician: Referring Cynthea Zachman: Treating Aniket Paye/Extender: Garfield Cornea in Treatment: 0 Encounter Discharge Information Items Post Procedure Vitals Discharge Condition: Stable Temperature (F): 98.8 Ambulatory Status: Cane Pulse (bpm): 88 Discharge Destination: Home Respiratory Rate (breaths/min): 18 Transportation: Private Auto Blood Pressure (mmHg): 152/74 Accompanied By: sister Schedule Follow-up Appointment: Yes Clinical Summary of Care: Patient Declined Electronic Signature(s) Signed: 12/31/2019 3:59:35 PM By: Baruch Gouty RN, BSN Entered By: Baruch Gouty on 12/31/2019 14:47:27 -------------------------------------------------------------------------------- Lower Extremity Assessment Details Patient Name: Date of Service: FA IN, Jessica L. 12/31/2019 1:15 PM Medical Record Number: [id number] Patient Account Number: [id number] Date of Birth/Sex: Treating RN: May 29, 1956 (64 y.o. Jessica Dunlap Primary Care Zaiah Credeur: [employer] Other Clinician: Referring Lela Murfin: Treating Aahana Elza/Extender: Garfield Cornea in Treatment: 0 Edema Assessment Assessed: Shirlyn Goltz: No] [Right: No] Edema: [Left: No] [Right: No] Calf Left: Right: Point of Measurement: cm From Medial Instep 28.5 cm 26.2 cm Ankle Left: Right: Point of Measurement: cm From Medial Instep 17.7 cm 17.8 cm Vascular Assessment Pulses: Dorsalis Pedis Palpable: [Left:Yes] [Right:Yes] Electronic Signature(s) Signed: 12/31/2019 3:59:35 PM By: Baruch Gouty RN, BSN Entered By: Baruch Gouty on 12/31/2019 13:39:06 -------------------------------------------------------------------------------- Multi Wound Chart Details Patient Name: Date of Service: FA IN, Jessica Dunlap. 12/31/2019 1:15 PM Medical Record Number: [id number] Patient Account Number: [id number] Date of Birth/Sex: Treating RN: Sep 11, 1955 (64 y.o. Clearnce Sorrel Primary Care Ligia Duguay: [employer] Other Clinician: Referring Dsean Vantol: Treating Marcelus Dubberly/Extender: Garfield Cornea in Treatment: 0 Vital Signs Height(in): 25 Pulse(bpm): 50 Weight(lbs): 131 Blood Pressure(mmHg): 152/74 Body Mass Index(BMI): 23 Temperature(F): 98.8 Respiratory Rate(breaths/min): 18 Photos: [1:No Photos Left, Lateral Foot] [2:No Photos Right T Great oe] [N/A:N/A N/A] Wound Location: [1:Gradually Appeared] [2:Gradually Appeared] [N/A:N/A] Wounding Event: [1:Diabetic Wound/Ulcer of the Lower] [2:Diabetic Wound/Ulcer of the Lower] [N/A:N/A] Primary Etiology: [1:Extremity Cataracts, Hypertension, Cirrhosis , Cataracts, Hypertension, Cirrhosis , N/A] [2:Extremity] Comorbid History: [1:Type II Diabetes, Rheumatoid Arthritis, Type II Diabetes, Rheumatoid Arthritis, Neuropathy 10/30/2019] [2:Neuropathy 10/11/2019] [N/A:N/A] Date Acquired: [1:0] [2:0] [N/A:N/A] Weeks of Treatment: [1:Open] [2:Open] [N/A:N/A] Wound Status: [1:0.8x1.1x0.1] [2:0.3x0.2x0.2] [N/A:N/A] Measurements L x W x D (cm) [1:0.691] [2:0.047] [N/A:N/A] A (cm) : rea [1:0.069] [2:0.009]  [N/A:N/A] Volume (cm) : [1:3] Starting Position 1 (o'clock): [1:9] Ending Position 1 (o'clock): [1:0.3] Maximum Distance 1 (cm): [1:Yes] [2:No] [N/A:N/A] Undermining: [1:Grade 1] [2:Grade 1] [N/A:N/A] Classification: [1:Small] [2:Small] [N/A:N/A] Exudate A mount: [1:Serous] [2:Serosanguineous] [N/A:N/A] Exudate Type: [1:amber] [2:red, brown] [N/A:N/A] Exudate Color: [1:Thickened] [2:Thickened] [N/A:N/A] Wound Margin: [1:Large (67-100%)] [2:Large (67-100%)] [N/A:N/A] Granulation A mount: [1:Red, Pale] [2:Red] [N/A:N/A] Granulation Quality: [1:None Present (0%)] [2:None Present (0%)] [N/A:N/A] Necrotic A mount: [1:Fat Layer (Subcutaneous Tissue)] [2:Fat Layer (Subcutaneous Tissue)] [N/A:N/A] Exposed Structures: [1:Exposed: Yes Fascia: No Tendon: No Muscle: No Joint: No Bone: No Small (1-33%)] [2:Exposed: Yes Fascia: No Tendon: No Muscle: No Joint: No Bone: No Small (1-33%)] [N/A:N/A] Epithelialization: [1:Debridement - Excisional] [2:Chemical/Enzymatic/Mechanical] [N/A:N/A] Debridement: Pre-procedure Verification/Time Out 14:13 [2:N/A] [N/A:N/A] Taken: [1:Other] [2:N/A] [N/A:N/A] Pain Control: [1:Callus, Subcutaneous] [2:N/A] [N/A:N/A] Tissue Debrided: [1:Skin/Subcutaneous Tissue] [2:N/A] [N/A:N/A] Level: [1:0.88] [2:N/A] [N/A:N/A] Debridement A (sq cm): [1:rea Curette] [2:N/A] [N/A:N/A] Instrument: [  1:Moderate] [2:None] [N/A:N/A] Bleeding: [1:Silver Nitrate] [2:N/A] [N/A:N/A] Hemostasis A chieved: [1:0] [2:N/A] [N/A:N/A] Procedural Pain: [1:0] [2:N/A] [N/A:N/A] Post Procedural Pain: [1:Procedure was tolerated well] [2:Procedure was tolerated well] [N/A:N/A] Debridement Treatment Response: [1:0.8x1.1x0.1] [2:0.3x0.2x0.2] [N/A:N/A] Post Debridement Measurements L x W x D (cm) [1:0.069] [2:0.009] [N/A:N/A] Post Debridement Volume: (cm) [1:Debridement] [2:N/A] [N/A:N/A] Treatment Notes Electronic Signature(s) Signed: 12/31/2019 4:13:30 PM By: Kela Millin Signed: 12/31/2019 5:03:02  PM By: Linton Ham MD Entered By: Linton Ham on 12/31/2019 14:33:36 -------------------------------------------------------------------------------- Multi-Disciplinary Care Plan Details Patient Name: Date of Service: FA IN, Jessica L. 12/31/2019 1:15 PM Medical Record Number: [id number] Patient Account Number: [id number] Date of Birth/Sex: Treating RN: 1956-02-13 (64 y.o. Clearnce Sorrel Primary Care Kataleia Quaranta: [employer] Other Clinician: Referring Shanara Schnieders: Treating Devaney Segers/Extender: Garfield Cornea in Treatment: 0 Active Inactive Nutrition Nursing Diagnoses: Potential for alteratiion in Nutrition/Potential for imbalanced nutrition Goals: Patient/caregiver verbalizes understanding of need to maintain therapeutic glucose control per primary care physician Date Initiated: 12/31/2019 Target Resolution Date: 02/04/2020 Goal Status: Active Interventions: Provide education on elevated blood sugars and impact on wound healing Notes: Pain, Acute or Chronic Nursing Diagnoses: Pain, acute or chronic: actual or potential Goals: Patient/caregiver will verbalize adequate pain control between visits Date Initiated: 12/31/2019 Target Resolution Date: 02/04/2020 Goal Status: Active Interventions: Provide education on pain management Notes: Wound/Skin Impairment Nursing Diagnoses: Impaired tissue integrity Goals: Ulcer/skin breakdown will have a volume reduction of 30% by week 4 Date Initiated: 12/31/2019 Target Resolution Date: 02/04/2020 Goal Status: Active Interventions: Provide education on ulcer and skin care Notes: Electronic Signature(s) Signed: 12/31/2019 4:13:30 PM By: Kela Millin Entered By: Kela Millin on 12/31/2019 14:10:57 -------------------------------------------------------------------------------- Pain Assessment Details Patient Name: Date of Service: Jessica Dunlap, Jessica Dunlap 12/31/2019 1:15 PM Medical Record Number: [id number] Patient  Account Number: [id number] Date of Birth/Sex: Treating RN: 1955-12-26 (64 y.o. Jessica Dunlap Primary Care Cyenna Rebello: [employer] Other Clinician: Referring Burhanuddin Kohlmann: Treating Aaryn Parrilla/Extender: Garfield Cornea in Treatment: 0 Active Problems Location of Pain Severity and Description of Pain Patient Has Paino No Site Locations Rate the pain. Current Pain Level: 0 Pain Management and Medication Current Pain Management: Electronic Signature(s) Signed: 12/31/2019 3:59:35 PM By: Baruch Gouty RN, BSN Entered By: Baruch Gouty on 12/31/2019 13:44:06 -------------------------------------------------------------------------------- Patient/Caregiver Education Details Patient Name: Date of Service: FA IN, Jessica Dunlap 7/2/2021andnbsp1:15 PM Medical Record Number: [id number] Patient Account Number: [id number] Date of Birth/Gender: Treating RN: [birth year] (63 y.o. Clearnce Sorrel Primary Care Physician: [employer] Other Clinician: Referring Physician: Treating Physician/Extender: Garfield Cornea in Treatment: 0 Education Assessment Education Provided To: Patient Education Topics Provided Elevated Blood Sugar/ Impact on Healing: Handouts: Elevated Blood Sugars: How Do They Affect Wound Healing Methods: Explain/Verbal Responses: State content correctly Pain: Handouts: A Guide to Pain Control Methods: Explain/Verbal Responses: State content correctly Wound/Skin Impairment: Handouts: Caring for Your Ulcer Methods: Explain/Verbal Responses: State content correctly Electronic Signature(s) Signed: 12/31/2019 4:13:30 PM By: Kela Millin Entered By: Kela Millin on 12/31/2019 14:11:15 -------------------------------------------------------------------------------- Wound Assessment Details Patient Name: Date of Service: Jessica Dunlap, Jessica Dunlap 12/31/2019 1:15 PM Medical Record Number: [id number] Patient Account Number:  [id number] Date of Birth/Sex: Treating RN: 04/01/1956 (64 y.o. Jessica Dunlap Primary Care Alvena Kiernan: [employer] Other Clinician: Referring Sangeeta Youse: Treating Jasha Hodzic/Extender: Garfield Cornea in Treatment: 0 Wound Status Wound Number: 1 Primary Diabetic Wound/Ulcer of the Lower Extremity Etiology: Wound Location: Left, Lateral Foot Wound Open Wounding Event: Gradually Appeared Status: Date Acquired: 10/30/2019 Comorbid Cataracts, Hypertension, Cirrhosis ,  Type II Diabetes, Weeks Of Treatment: 0 History: Rheumatoid Arthritis, Neuropathy Clustered Wound: No Wound Measurements Length: (cm) 0.8 Width: (cm) 1.1 Depth: (cm) 0.1 Area: (cm) 0.691 Volume: (cm) 0.069 % Reduction in Area: % Reduction in Volume: Epithelialization: Small (1-33%) Tunneling: No Undermining: Yes Starting Position (o'clock): 3 Ending Position (o'clock): 9 Maximum Distance: (cm) 0.3 Wound Description Classification: Grade 1 Wound Margin: Thickened Exudate Amount: Small Exudate Type: Serous Exudate Color: amber Foul Odor After Cleansing: No Slough/Fibrino No Wound Bed Granulation Amount: Large (67-100%) Exposed Structure Granulation Quality: Red, Pale Fascia Exposed: No Necrotic Amount: None Present (0%) Fat Layer (Subcutaneous Tissue) Exposed: Yes Tendon Exposed: No Muscle Exposed: No Joint Exposed: No Bone Exposed: No Treatment Notes Wound #1 (Left, Lateral Foot) 3. Primary Dressing Applied Polymem 4. Secondary Dressing Dry Gauze Roll Gauze 7. Footwear/Offloading device applied Diabetic shoe Electronic Signature(s) Signed: 12/31/2019 3:59:35 PM By: Baruch Gouty RN, BSN Entered By: Baruch Gouty on 12/31/2019 13:42:34 -------------------------------------------------------------------------------- Wound Assessment Details Patient Name: Date of Service: FA IN, Jessica L. 12/31/2019 1:15 PM Medical Record Number: [id number] Patient Account Number:  [id number] Date of Birth/Sex: Treating RN: August 06, 1955 (64 y.o. Jessica Dunlap Primary Care Altair Appenzeller: [employer] Other Clinician: Referring Tywan Siever: Treating Tobyn Osgood/Extender: Garfield Cornea in Treatment: 0 Wound Status Wound Number: 2 Primary Diabetic Wound/Ulcer of the Lower Extremity Etiology: Wound Location: Right T Great oe Wound Open Wounding Event: Gradually Appeared Status: Date Acquired: 10/11/2019 Comorbid Cataracts, Hypertension, Cirrhosis , Type II Diabetes, Weeks Of Treatment: 0 History: Rheumatoid Arthritis, Neuropathy Clustered Wound: No Wound Measurements Length: (cm) 0.3 Width: (cm) 0.2 Depth: (cm) 0.2 Area: (cm) 0.047 Volume: (cm) 0.009 % Reduction in Area: % Reduction in Volume: Epithelialization: Small (1-33%) Tunneling: No Undermining: No Wound Description Classification: Grade 1 Wound Margin: Thickened Exudate Amount: Small Exudate Type: Serosanguineous Exudate Color: red, brown Foul Odor After Cleansing: No Slough/Fibrino No Wound Bed Granulation Amount: Large (67-100%) Exposed Structure Granulation Quality: Red Fascia Exposed: No Necrotic Amount: None Present (0%) Fat Layer (Subcutaneous Tissue) Exposed: Yes Tendon Exposed: No Muscle Exposed: No Joint Exposed: No Bone Exposed: No Treatment Notes Wound #2 (Right Toe Great) 3. Primary Dressing Applied Polymem 4. Secondary Dressing Dry Gauze Roll Gauze 7. Footwear/Offloading device applied Diabetic shoe Electronic Signature(s) Signed: 12/31/2019 3:59:35 PM By: Baruch Gouty RN, BSN Entered By: Baruch Gouty on 12/31/2019 13:43:54 -------------------------------------------------------------------------------- Vitals Details Patient Name: Date of Service: FA IN, Ludy L. 12/31/2019 1:15 PM Medical Record Number: [id number] Patient Account Number: [id number] Date of Birth/Sex: Treating RN: [birth year] (64 y.o. Jessica Dunlap Primary Care  Jarriel Papillion: [employer] Other Clinician: Referring Marua Qin: Treating Claris Guymon/Extender: Garfield Cornea in Treatment: 0 Vital Signs Time Taken: 13:14 Temperature (F): 98.8 Height (in): 63 Pulse (bpm): 88 Source: Stated Respiratory Rate (breaths/min): 18 Weight (lbs): 131 Blood Pressure (mmHg): 152/74 Source: Stated Reference Range: 80 - 120 mg / dl Body Mass Index (BMI): 23.2 Notes does not check blood sugars at home Electronic Signature(s) Signed: 12/31/2019 3:59:35 PM By: Baruch Gouty RN, BSN Entered By: Baruch Gouty on 12/31/2019 13:15:18

## 2019-12-31 NOTE — Progress Notes (Signed)
Jessica, Dunlap ([id number]) Visit Report for 12/31/2019 Abuse/Suicide Risk Screen Details Patient Name: Date of Service: Jessica Dunlap, Jessica Dunlap 12/31/2019 1:15 PM Medical Record Number: [id number] Patient Account Number: [id number] Date of Birth/Sex: Treating RN: 1956-01-05 (64 y.o. Elam Dutch Primary Care Doninique Lwin: [employer] Other Clinician: Referring Amarianna Abplanalp: Treating Murl Golladay/Extender: [place]field Cornea Dunlap Treatment: 0 Abuse/Suicide Risk Screen Items Answer ABUSE RISK SCREEN: Has anyone close to you tried to hurt or harm you recentlyo No Do you feel uncomfortable with anyone Dunlap your familyo No Has anyone forced you do things that you didnt want to doo No Electronic Signature(s) Signed: 12/31/2019 3:59:35 PM By: Baruch Gouty RN, BSN Entered By: Baruch Gouty on 12/31/2019 13:26:18 -------------------------------------------------------------------------------- Activities of Daily Living Details Patient Name: Date of Service: Jessica Dunlap, Jessica Dunlap 12/31/2019 1:15 PM Medical Record Number: [id number] Patient Account Number: [id number] Date of Birth/Sex: Treating RN: May 23, 1956 (64 y.o. Elam Dutch Primary Care Galadriel Shroff: [employer] Other Clinician: Referring Keira Bohlin: Treating Chessa Barrasso/Extender: Garfield Cornea Dunlap Treatment: 0 Activities of Daily Living Items Answer Activities of Daily Living (Please select one for each item) Drive Automobile Not Able T Medications ake Completely Able Use T elephone Completely Able Care for Appearance Completely Able Use T oilet Completely Able Bath / Shower Completely Able Dress Self Completely Able Feed Self Completely Able Walk Need Assistance Get Dunlap / Out Bed Completely Able Housework Completely Able Prepare Meals Completely [place] for Self Need Assistance Electronic Signature(s) Signed: 12/31/2019 3:59:35 PM By: Baruch Gouty RN, BSN Entered By:  Baruch Gouty on 12/31/2019 13:26:53 -------------------------------------------------------------------------------- Education Screening Details Patient Name: Date of Service: Jessica Dunlap, Jessica German. 12/31/2019 1:15 PM Medical Record Number: [id number] Patient Account Number: [id number] Date of Birth/Sex: Treating RN: [birth year] (64 y.o. Elam Dutch Primary Care Pearle Wandler: [employer] Other Clinician: Referring Latesa Fratto: Treating Franklin Baumbach/Extender: Garfield Cornea Dunlap Treatment: 0 Primary Learner Assessed: Patient Learning Preferences/Education Level/Primary Language Learning Preference: Explanation, Demonstration, Printed Material Highest Education Level: High School Preferred Language: English Cognitive Barrier Language Barrier: No Translator Needed: No Memory Deficit: No Emotional Barrier: No Cultural/Religious Beliefs Affecting Medical Care: No Physical Barrier Impaired Vision: Yes Glasses, reading Impaired Hearing: No Decreased Hand dexterity: No Knowledge/Comprehension Knowledge Level: High Comprehension Level: High Ability to understand written instructions: High Ability to understand verbal instructions: High Motivation Anxiety Level: Calm Cooperation: Cooperative Education Importance: Acknowledges Need Interest Dunlap Health Problems: Asks Questions Perception: Coherent Willingness to Engage Dunlap Self-Management High Activities: Readiness to Engage Dunlap Self-Management High Activities: Electronic Signature(s) Signed: 12/31/2019 3:59:35 PM By: Baruch Gouty RN, BSN Entered By: Baruch Gouty on 12/31/2019 13:27:24 -------------------------------------------------------------------------------- Fall Risk Assessment Details Patient Name: Date of Service: Jessica Dunlap, Jessica L. 12/31/2019 1:15 PM Medical Record Number: [id number] Patient Account Number: [id number] Date of Birth/Sex: Treating RN: 1956-04-19 (63 y.o. Elam Dutch Primary Care  Torren Maffeo: [employer] Other Clinician: Referring Ayub Kirsh: Treating Anais Koenen/Extender: Garfield Cornea Dunlap Treatment: 0 Fall Risk Assessment Items Have you had 2 or more falls Dunlap the last 12 monthso 0 No Have you had any fall that resulted Dunlap injury Dunlap the last 12 monthso 0 No FALLS RISK SCREEN History of falling - immediate or within 3 months 0 No Secondary diagnosis (Do you have 2 or more medical diagnoseso) 0 No Ambulatory aid None/bed rest/wheelchair/nurse 0 No Crutches/cane/walker 15 Yes Furniture 0 No Intravenous therapy Access/Saline/Heparin Lock 0 No Gait/Transferring Normal/ bed rest/ wheelchair 0 No Weak (short  steps with or without shuffle, stooped but able to lift head while walking, may seek 10 Yes support from furniture) Impaired (short steps with shuffle, may have difficulty arising from chair, head down, impaired 0 No balance) Mental Status Oriented to own ability 0 Yes Electronic Signature(s) Signed: 12/31/2019 3:59:35 PM By: Baruch Gouty RN, BSN Entered By: Baruch Gouty on 12/31/2019 13:27:48 -------------------------------------------------------------------------------- Foot Assessment Details Patient Name: Date of Service: Jessica Dunlap, Jessica German. 12/31/2019 1:15 PM Medical Record Number: [id number] Patient Account Number: [id number] Date of Birth/Sex: Treating RN: 06-14-1956 (64 y.o. Elam Dutch Primary Care Lakiesha Ralphs: [employer] Other Clinician: Referring Olia Hinderliter: Treating Thinh Cuccaro/Extender: Garfield Cornea Dunlap Treatment: 0 Foot Assessment Items Site Locations + = Sensation present, - = Sensation absent, C = Callus, U = Ulcer R = Redness, W = Warmth, M = Maceration, PU = Pre-ulcerative lesion F = Fissure, S = Swelling, D = Dryness Assessment Right: Left: Other Deformity: No Yes Prior Foot Ulcer: No No Prior Amputation: No No Charcot Joint: No No Ambulatory Status: Ambulatory With  Help Assistance Device: Cane Gait: Steady Electronic Signature(s) Signed: 12/31/2019 3:59:35 PM By: Baruch Gouty RN, BSN Entered By: Baruch Gouty on 12/31/2019 13:37:30 -------------------------------------------------------------------------------- Nutrition Risk Screening Details Patient Name: Date of Service: Jessica Dunlap, Jessica Dunlap 12/31/2019 1:15 PM Medical Record Number: [id number] Patient Account Number: [id number] Date of Birth/Sex: Treating RN: 20-Feb-1956 (64 y.o. Elam Dutch Primary Care Xayden Linsey: [employer] Other Clinician: Referring Xayden Linsey: Treating Selby Slovacek/Extender: Garfield Cornea Dunlap Treatment: 0 Height (Dunlap): 63 Weight (lbs): 131 Body Mass Index (BMI): 23.2 Nutrition Risk Screening Items Score Screening NUTRITION RISK SCREEN: I have an illness or condition that made me change the kind and/or amount of food I eat 0 No I eat fewer than two meals per day 0 No I eat few fruits and vegetables, or milk products 0 No I have three or more drinks of beer, liquor or wine almost every day 0 No I have tooth or mouth problems that make it hard for me to eat 0 No I don't always have enough money to buy the food I need 0 No I eat alone most of the time 0 No I take three or more different prescribed or over-the-counter drugs a day 1 Yes Without wanting to, I have lost or gained 10 pounds Dunlap the last six months 0 No I am not always physically able to shop, cook and/or feed myself 0 No Nutrition Protocols Good Risk Protocol 0 No interventions needed Moderate Risk Protocol High Risk Proctocol Risk Level: Good Risk Score: 1 Electronic Signature(s) Signed: 12/31/2019 3:59:35 PM By: Baruch Gouty RN, BSN Entered By: Baruch Gouty on 12/31/2019 13:28:21

## 2019-12-31 NOTE — Progress Notes (Signed)
Jessica Dunlap, Jessica Dunlap ([id number]) Visit Report for 12/31/2019 Chief Complaint Document Details Patient Name: Date of Service: FA IN, [place] 12/31/2019 1:15 PM Medical Record Number: [id number] Patient Account Number: [id number] Date of Birth/Sex: Treating RN: 1955/09/02 (64 y.o. Clearnce Sorrel Primary Care Provider: [employer] Other Clinician: Referring Provider: Treating Provider/Extender: Garfield Cornea in Treatment: 0 Information Obtained from: Patient Chief Complaint 12/31/2019; patient is here for review of a wound on the outside of her left foot and the plantar tip of her first toe on the right Electronic Signature(s) Signed: 12/31/2019 5:03:02 PM By: Linton Ham MD Entered By: Linton Ham on 12/31/2019 14:34:10 -------------------------------------------------------------------------------- Debridement Details Patient Name: Date of Service: FA IN, Jessica German. 12/31/2019 1:15 PM Medical Record Number: [id number] Patient Account Number: [id number] Date of Birth/Sex: Treating RN: [birth year] (64 y.o. Clearnce Sorrel Primary Care Provider: [employer] Other Clinician: Referring Provider: Treating Provider/Extender: Garfield Cornea in Treatment: 0 Debridement Performed for Assessment: Wound #1 Left,Lateral Foot Performed By: Physician Ricard Dillon., MD Debridement Type: Debridement Severity of Tissue Pre Debridement: Fat layer exposed Level of Consciousness (Pre-procedure): Awake and Alert Pre-procedure Verification/Time Out Yes - 14:13 Taken: Start Time: 14:13 Pain Control: Other : benzocaine, 20% T Area Debrided (L x W): otal 0.8 (cm) x 1.1 (cm) = 0.88 (cm) Tissue and other material debrided: Callus, Subcutaneous Level: Skin/Subcutaneous Tissue Debridement Description: Excisional Instrument: Curette Bleeding: Moderate Hemostasis Achieved: Silver Nitrate End Time: 14:14 Procedural Pain: 0 Post Procedural Pain:  0 Response to Treatment: Procedure was tolerated well Level of Consciousness (Post- Awake and Alert procedure): Post Debridement Measurements of Total Wound Length: (cm) 0.8 Width: (cm) 1.1 Depth: (cm) 0.1 Volume: (cm) 0.069 Character of Wound/Ulcer Post Debridement: Improved Severity of Tissue Post Debridement: Fat layer exposed Post Procedure Diagnosis Same as Pre-procedure Electronic Signature(s) Signed: 12/31/2019 4:13:30 PM By: Kela Millin Signed: 12/31/2019 5:03:02 PM By: Linton Ham MD Entered By: Linton Ham on 12/31/2019 14:33:50 -------------------------------------------------------------------------------- Debridement Details Patient Name: Date of Service: FA IN, Jessica L. 12/31/2019 1:15 PM Medical Record Number: [id number] Patient Account Number: [id number] Date of Birth/Sex: Treating RN: October 14, 1955 (64 y.o. Clearnce Sorrel Primary Care Provider: [employer] Other Clinician: Referring Provider: Treating Provider/Extender: Garfield Cornea in Treatment: 0 Debridement Performed for Assessment: Wound #2 Right T Great oe Performed By: Physician Ricard Dillon., MD Debridement Type: Chemical/Enzymatic/Mechanical Agent Used: mechanical debridement Severity of Tissue Pre Debridement: Fat layer exposed Level of Consciousness (Pre-procedure): Awake and Alert Pre-procedure Verification/Time Out No Taken: Bleeding: None Response to Treatment: Procedure was tolerated well Level of Consciousness (Post- Awake and Alert procedure): Post Debridement Measurements of Total Wound Length: (cm) 0.3 Width: (cm) 0.2 Depth: (cm) 0.2 Volume: (cm) 0.009 Character of Wound/Ulcer Post Debridement: Improved Severity of Tissue Post Debridement: Fat layer exposed Post Procedure Diagnosis Same as Pre-procedure Electronic Signature(s) Signed: 12/31/2019 4:13:30 PM By: Kela Millin Signed: 12/31/2019 5:03:02 PM By: Linton Ham MD Entered  By: Kela Millin on 12/31/2019 15:27:53 -------------------------------------------------------------------------------- HPI Details Patient Name: Date of Service: FA IN, Jessica L. 12/31/2019 1:15 PM Medical Record Number: [id number] Patient Account Number: [id number] Date of Birth/Sex: Treating RN: [birth year] (64 y.o. Clearnce Sorrel Primary Care Provider: [employer] Other Clinician: Referring Provider: Treating Provider/Extender: Garfield Cornea in Treatment: 0 History of Present Illness HPI Description: ADMISSION 12/31/2019 This is a 64 year old woman with type 2 diabetes. She has been dealing with an area on her tip of her right great toe  and the outside of her left foot since mid April. She has been following with Dr. Marcheta Grammes podiatry in [place]. She has recently moved to [place]. She has been using Silvadene cream to the wound. She has a AFO brace brace to try and straighten her left ankle but the patient states it hurts and she sometimes does not use it. She tries to be active including going to the store etc. etc. She also has significant neuropathy in both feet. Past medical history includes Karlene Lineman, cataracts, type 2 diabetes with neuropathy, history of Graves' disease now hypothyroid, autoimmune hemolytic anemia, quit smoking 10 years ago, hypertension Arterial studies; done this year she had ABI in the right of 0.94 TBI of 0.93. On the left 0.95 and a TBI of 0.67 Electronic Signature(s) Signed: 12/31/2019 5:03:02 PM By: Linton Ham MD Entered By: Linton Ham on 12/31/2019 14:36:22 -------------------------------------------------------------------------------- Physical Exam Details Patient Name: Date of Service: FA IN, Jessica L. 12/31/2019 1:15 PM Medical Record Number: [id number] Patient Account Number: [id number] Date of Birth/Sex: Treating RN: Aug 25, 1955 (64 y.o. Clearnce Sorrel Primary Care Provider: [employer] Other  Clinician: Referring Provider: Treating Provider/Extender: Garfield Cornea in Treatment: 0 Constitutional Patient is hypertensive.. Pulse regular and within target range for patient.Marland Kitchen Respirations regular, non-labored and within target range.. Temperature is normal and within the target range for the patient.Marland Kitchen Appears in no distress. Cardiovascular Pedal pulses are palpable bilaterally. Popliteal pulses palpable bilaterally. Musculoskeletal Significant inversion of the right ankle. I could not easily correct this possibly. No joint effusion. Integumentary (Hair, Skin) No erythema around the wound. Neurological Diabetic insensate neuropathy to microfilament and pain. Notes Wound exam; On the left her area is on the left lateral foot at the base of the fifth metatarsal B. Because of her ankle inversion this is a weightbearing surface there is callus and debris around the wound circumference which I removed also fibrinous debris on the wound surface again removed. Hemostasis with silver nitrate. The small area on the tip of the right great toe. This looks like it is progressing towards closure did not require debridement Electronic Signature(s) Signed: 12/31/2019 5:03:02 PM By: Linton Ham MD Entered By: Linton Ham on 12/31/2019 14:37:59 -------------------------------------------------------------------------------- Physician Orders Details Patient Name: Date of Service: FA IN, Lorin L. 12/31/2019 1:15 PM Medical Record Number: [id number] Patient Account Number: [id number] Date of Birth/Sex: Treating RN: 1955/08/29 (64 y.o. Clearnce Sorrel Primary Care Provider: [employer] Other Clinician: Referring Provider: Treating Provider/Extender: Garfield Cornea in Treatment: 0 Verbal / Phone Orders: No Diagnosis Coding Follow-up Appointments Return Appointment in 1 week. Dressing Change Frequency Wound #1 Left,Lateral Foot Change  Dressing every other day. Wound #2 Right T Great oe Change Dressing every other day. Wound Cleansing May shower and wash wound with soap and water. - when dressing is changed Primary Wound Dressing Wound #1 Left,Lateral Foot Polymem Wound #2 Right T Great oe Polymem Secondary Dressing Wound #1 Left,Lateral Foot Foam - foam donut Kerlix/Rolled Gauze Dry Gauze Wound #2 Right T Great oe Kerlix/Rolled Gauze Dry Gauze Electronic Signature(s) Signed: 12/31/2019 4:13:30 PM By: Kela Millin Signed: 12/31/2019 5:03:02 PM By: Linton Ham MD Entered By: Kela Millin on 12/31/2019 14:26:57 -------------------------------------------------------------------------------- Problem List Details Patient Name: Date of Service: FA IN, Jessica German. 12/31/2019 1:15 PM Medical Record Number: [id number] Patient Account Number: [id number] Date of Birth/Sex: Treating RN: 06/01/1956 (64 y.o. Clearnce Sorrel Primary Care Provider: [employer] Other Clinician: Referring Provider: Treating Provider/Extender: Fabio Neighbors  Weeks in Treatment: 0 Active Problems ICD-10 Encounter Code Description Active Date MDM Diagnosis E11.621 Type 2 diabetes mellitus with foot ulcer 12/31/2019 No Yes L97.522 Non-pressure chronic ulcer of other part of left foot with fat layer exposed 12/31/2019 No Yes L97.511 Non-pressure chronic ulcer of other part of right foot limited to breakdown of 12/31/2019 No Yes skin E11.42 Type 2 diabetes mellitus with diabetic polyneuropathy 12/31/2019 No Yes M21.172 Varus deformity, not elsewhere classified, left ankle 12/31/2019 No Yes Inactive Problems Resolved Problems Electronic Signature(s) Signed: 12/31/2019 5:03:02 PM By: Linton Ham MD Entered By: Linton Ham on 12/31/2019 14:29:37 -------------------------------------------------------------------------------- Progress Note Details Patient Name: Date of Service: FA IN, Jessica German. 12/31/2019 1:15  PM Medical Record Number: [id number] Patient Account Number: [id number] Date of Birth/Sex: Treating RN: 02-10-1956 (64 y.o. Clearnce Sorrel Primary Care Provider: [employer] Other Clinician: Referring Provider: Treating Provider/Extender: Garfield Cornea in Treatment: 0 Subjective Chief Complaint Information obtained from Patient 12/31/2019; patient is here for review of a wound on the outside of her left foot and the plantar tip of her first toe on the right History of Present Illness (HPI) ADMISSION 12/31/2019 This is a 64 year old woman with type 2 diabetes. She has been dealing with an area on her tip of her right great toe and the outside of her left foot since mid April. She has been following with Dr. Marcheta Grammes podiatry in [place]. She has recently moved to [place]. She has been using Silvadene cream to the wound. She has a AFO brace brace to try and straighten her left ankle but the patient states it hurts and she sometimes does not use it. She tries to be active including going to the store etc. etc. She also has significant neuropathy in both feet. Past medical history includes Karlene Lineman, cataracts, type 2 diabetes with neuropathy, history of Graves' disease now hypothyroid, autoimmune hemolytic anemia, quit smoking 10 years ago, hypertension Arterial studies; done this year she had ABI in the right of 0.94 TBI of 0.93. On the left 0.95 and a TBI of 0.67 Patient History Information obtained from Patient. Allergies No Known Allergies Family History Cancer - Mother, Heart Disease - Mother,Father, Hypertension - Mother, Lung Disease - Mother, Thyroid Problems - Mother,Father,Siblings, No family history of Diabetes, Hereditary Spherocytosis, Kidney Disease, Seizures, Stroke, Tuberculosis. Social History Former smoker - quit 10 yrs, Marital Status - Separated, Alcohol Use - Never, Drug Use - No History, Caffeine Use - Daily - coffee soda. Medical  History Eyes Patient has history of Cataracts - bil removed Denies history of Glaucoma, Optic Neuritis Ear/Nose/Mouth/Throat Denies history of Chronic sinus problems/congestion, Middle ear problems Cardiovascular Patient has history of Hypertension Gastrointestinal Patient has history of Cirrhosis - nonalcoholic Endocrine Patient has history of Type II Diabetes Denies history of Type I Diabetes Integumentary (Skin) Denies history of History of Burn Musculoskeletal Patient has history of Rheumatoid Arthritis Denies history of Gout, Osteoarthritis, Osteomyelitis Neurologic Patient has history of Neuropathy Oncologic Denies history of Received Chemotherapy, Received Radiation Psychiatric Denies history of Anorexia/bulimia, Confinement Anxiety Patient is treated with Oral Agents. Blood sugar is not tested. Hospitalization/Surgery History - complete hysterectomy. - a[[endectomy. Medical A Surgical History Notes nd Endocrine hypothyroidism Oncologic cervical CA Review of Systems (ROS) Constitutional Symptoms (General Health) Denies complaints or symptoms of Fatigue, Fever, Chills, Marked Weight Change. Eyes Complains or has symptoms of Glasses / Contacts - reading. Ear/Nose/Mouth/Throat Denies complaints or symptoms of Chronic sinus problems or rhinitis. Respiratory Denies complaints or symptoms of  Chronic or frequent coughs, Shortness of Breath. Cardiovascular Denies complaints or symptoms of Chest pain. Genitourinary Denies complaints or symptoms of Frequent urination. Integumentary (Skin) Complains or has symptoms of Wounds - right great toe, left foot. Musculoskeletal Denies complaints or symptoms of Muscle Pain, Muscle Weakness. Neurologic Complains or has symptoms of Numbness/parasthesias. Psychiatric Denies complaints or symptoms of Claustrophobia, Suicidal. Objective Constitutional Patient is hypertensive.. Pulse regular and within target range for patient.Marland Kitchen  Respirations regular, non-labored and within target range.. Temperature is normal and within the target range for the patient.Marland Kitchen Appears in no distress. Vitals Time Taken: 1:14 PM, Height: 63 in, Source: Stated, Weight: 131 lbs, Source: Stated, BMI: 23.2, Temperature: 98.8 F, Pulse: 88 bpm, Respiratory Rate: 18 breaths/min, Blood Pressure: 152/74 mmHg. General Notes: does not check blood sugars at home Cardiovascular Pedal pulses are palpable bilaterally. Popliteal pulses palpable bilaterally. Musculoskeletal Significant inversion of the right ankle. I could not easily correct this possibly. No joint effusion. Neurological Diabetic insensate neuropathy to microfilament and pain. General Notes: Wound exam; ooOn the left her area is on the left lateral foot at the base of the fifth metatarsal B. Because of her ankle inversion this is a weightbearing surface there is callus and debris around the wound circumference which I removed also fibrinous debris on the wound surface again removed. Hemostasis with silver nitrate. ooThe small area on the tip of the right great toe. This looks like it is progressing towards closure did not require debridement Integumentary (Hair, Skin) No erythema around the wound. Wound #1 status is Open. Original cause of wound was Gradually Appeared. The wound is located on the Left,Lateral Foot. The wound measures 0.8cm length x 1.1cm width x 0.1cm depth; 0.691cm^2 area and 0.069cm^3 volume. There is Fat Layer (Subcutaneous Tissue) Exposed exposed. There is no tunneling noted, however, there is undermining starting at 3:00 and ending at 9:00 with a maximum distance of 0.3cm. There is a small amount of serous drainage noted. The wound margin is thickened. There is large (67-100%) red, pale granulation within the wound bed. There is no necrotic tissue within the wound bed. Wound #2 status is Open. Original cause of wound was Gradually Appeared. The wound is located on the  Right T Great. The wound measures 0.3cm length x oe 0.2cm width x 0.2cm depth; 0.047cm^2 area and 0.009cm^3 volume. There is Fat Layer (Subcutaneous Tissue) Exposed exposed. There is no tunneling or undermining noted. There is a small amount of serosanguineous drainage noted. The wound margin is thickened. There is large (67-100%) red granulation within the wound bed. There is no necrotic tissue within the wound bed. 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 There was a Excisional Skin/Subcutaneous Tissue Debridement with a total area of 0.88 sq cm performed by Ricard Dillon., MD. With the following instrument(s): Curette Material removed includes Callus and Subcutaneous Tissue and after achieving pain control using Other (benzocaine, 20%). No specimens were taken. A time out was conducted at 14:13, prior to the start of the procedure. A Moderate amount of bleeding was controlled with Silver  Nitrate. The procedure was tolerated well with a pain level of 0 throughout and a pain level of 0 following the procedure. Post Debridement Measurements: 0.8cm length x 1.1cm width x 0.1cm depth; 0.069cm^3 volume. Character of Wound/Ulcer Post Debridement is improved. Severity of Tissue Post Debridement is: Fat layer exposed. Post procedure Diagnosis Wound #1: Same as Pre-Procedure Plan Follow-up Appointments: Return Appointment in 1 week. Dressing Change Frequency: Wound #1 Left,Lateral Foot: Change  Dressing every other day. Wound #2 Right T Great: oe Change Dressing every other day. Wound Cleansing: May shower and wash wound with soap and water. - when dressing is changed Primary Wound Dressing: Wound #1 Left,Lateral Foot: Polymem Wound #2 Right T Great: oe Polymem Secondary Dressing: Wound #1 Left,Lateral Foot: Foam - foam donut Kerlix/Rolled Gauze Dry Gauze Wound #2 Right T Great: oe Kerlix/Rolled Gauze Dry Gauze #1 I am going to use PolyMem Ag to both of these wound areas. Hopefully this will help relieve pressure and friction in this area 2. She has an AFO brace on the left. She says this is painful not sure that that is actually dosn't takes her off the side of her foot that much. 3. I talked to her about a total contact cast which she may or may not tolerate. Issues would be a decline in her balance related to the uneven nature of her foot wear as well as issues with padding the foot in the cast itself. I told her I would give this 3 weeks to see if we can do something with the wound surface short of this. 4. I see no evidence of infection or ischemia at this point. Electronic Signature(s) Signed: 12/31/2019 5:03:02 PM By: Linton Ham MD Entered By: Linton Ham on 12/31/2019 14:39:49 -------------------------------------------------------------------------------- HxROS Details Patient Name: Date of Service: FA IN, Tierrah L. 12/31/2019 1:15 PM Medical Record Number: [id number] Patient Account Number: [id number] Date of Birth/Sex: Treating RN: December 17, 1955 (64 y.o. Elam Dutch Primary Care Provider: [employer] Other Clinician: Referring Provider: Treating Provider/Extender: Garfield Cornea in Treatment: 0 Information Obtained From Patient Constitutional Symptoms (General Health) Complaints and Symptoms: Negative for: Fatigue; Fever; Chills; Marked Weight Change Eyes Complaints and Symptoms: Positive for: Glasses / Contacts -  reading Medical History: Positive for: Cataracts - bil removed Negative for: Glaucoma; Optic Neuritis Ear/Nose/Mouth/Throat Complaints and Symptoms: Negative for: Chronic sinus problems or rhinitis Medical History: Negative for: Chronic sinus problems/congestion; Middle ear problems Respiratory Complaints and Symptoms: Negative for: Chronic or frequent coughs; Shortness of Breath Cardiovascular Complaints and Symptoms: Negative for: Chest pain Medical History: Positive for: Hypertension Genitourinary Complaints and Symptoms: Negative for: Frequent urination Integumentary (Skin) Complaints and Symptoms: Positive for: Wounds - right great toe, left foot Medical History: Negative for: History of Burn Musculoskeletal Complaints and Symptoms: Negative for: Muscle Pain; Muscle Weakness Medical History: Positive for: Rheumatoid Arthritis Negative for: Gout; Osteoarthritis; Osteomyelitis Neurologic Complaints and Symptoms: Positive for: Numbness/parasthesias Medical History: Positive for: Neuropathy Psychiatric Complaints and Symptoms: Negative for: Claustrophobia; Suicidal Medical History: Negative for: Anorexia/bulimia; Confinement Anxiety Hematologic/Lymphatic Gastrointestinal Medical History: Positive for: Cirrhosis - nonalcoholic Endocrine Medical History: Positive for: Type II Diabetes Negative for: Type I Diabetes Past Medical History Notes: hypothyroidism Time with diabetes: 6 yrs Treated with: Oral agents Blood sugar tested every day: No Immunological Oncologic Medical History: Negative for: Received Chemotherapy; Received Radiation Past Medical History Notes: cervical CA HBO Extended History Items Eyes: Cataracts Immunizations Pneumococcal Vaccine: Received Pneumococcal Vaccination: Yes Implantable Devices None Hospitalization / Surgery History Type  of Hospitalization/Surgery complete hysterectomy a[[endectomy Family and Social  History Cancer: Yes - Mother; Diabetes: No; Heart Disease: Yes - Mother,Father; Hereditary Spherocytosis: No; Hypertension: Yes - Mother; Kidney Disease: No; Lung Disease: Yes - Mother; Seizures: No; Stroke: No; Thyroid Problems: Yes - Mother,Father,Siblings; Tuberculosis: No; Former smoker - quit 10 yrs; Marital Status - Separated; Alcohol Use: Never; Drug Use: No History; Caffeine Use: Daily - coffee soda; Financial Concerns: No; Food, Clothing or Shelter Needs: No; Support System Lacking: No; Transportation Concerns: Yes - sister brings pt when she can Engineer, maintenance) Signed: 12/31/2019 3:59:35 PM By: Baruch Gouty RN, BSN Signed: 12/31/2019 5:03:02 PM By: Linton Ham MD Entered By: Baruch Gouty on 12/31/2019 13:25:56 -------------------------------------------------------------------------------- SuperBill Details Patient Name: Date of Service: FA IN, Jessica German. 12/31/2019 Medical Record Number: [id number] Patient Account Number: [id number] Date of Birth/Sex: Treating RN: 09/30/1955 (64 y.o. 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, not elsewhere classified, left ankle Facility Procedures CPT4 Code: [id number] Description: [facility] VISIT-LEV 3 EST PT Modifier: 25 Quantity: 1 CPT4 Code: [id number] 1 Description: 6503 - DEB SUBQ TISSUE 20 SQ CM/< ICD-10 Diagnosis Description L97.522 Non-pressure chronic ulcer of other part of left foot with fat layer  exposed Modifier: Quantity: 1 Physician Procedures : CPT4 Code Description Modifier [id number] [place] PHYS LEVEL 3 NEW PT 25 ICD-10 Diagnosis Description E11.621 Type 2 diabetes mellitus with foot ulcer L97.522 Non-pressure chronic ulcer of other part of left foot with fat layer exposed L97.511 Non-pressure  chronic ulcer of other part of right foot limited to breakdown of skin E11.42 Type 2 diabetes mellitus with diabetic polyneuropathy Quantity: 1 : [id number] - WC PHYS SUBQ TISS 20 SQ CM ICD-10 Diagnosis Description L97.522 Non-pressure chronic ulcer of other part of left foot with fat layer exposed Quantity: 1 Electronic Signature(s) Signed: 12/31/2019 5:03:02 PM By: Linton Ham MD Entered By: Linton Ham on 12/31/2019 14:40:16

## 2020-01-07 ENCOUNTER — Other Ambulatory Visit: Payer: Self-pay

## 2020-01-07 ENCOUNTER — Encounter (HOSPITAL_BASED_OUTPATIENT_CLINIC_OR_DEPARTMENT_OTHER): Payer: 59 | Admitting: Internal Medicine

## 2020-01-07 DIAGNOSIS — E11621 Type 2 diabetes mellitus with foot ulcer: Secondary | ICD-10-CM | POA: Diagnosis not present

## 2020-01-11 NOTE — Progress Notes (Addendum)
ASJIA, BERRIOS ([id number]) Visit Report for 01/07/2020 Arrival Information Details Patient Name: Date of Service: [place], [place]. 01/07/2020 10:30 A M Medical Record Number: [id number] Patient Account Number: [id number] Date of Birth/Sex: Treating RN: 1956-06-14 (64 y.o. Hollie Salk, [place]rene [place] Primary Care Julann Mcgilvray: [employer] Other Clinician: Minerva Fester Referring Aija Scarfo: Treating Alexiss Iturralde/Extender: Garfield Cornea Dunlap Treatment: 1 Visit Information History Since Last Visit All ordered tests and consults were completed: No Patient Arrived: Jessica Dunlap Added or deleted any medications: No Arrival Time: 10:50 Any new allergies or adverse reactions: No Accompanied By: daughter Had a fall or experienced change Dunlap No Transfer Assistance: None activities of daily living that may affect Patient Identification Verified: Yes risk of falls: Secondary Verification Process Completed: Yes Signs or symptoms of abuse/neglect since last visito No Patient Requires Transmission-Based Precautions: No Hospitalized since last visit: No Patient Has Alerts: Yes Implantable device outside of the clinic excluding No Patient Alerts: R ABI =.94 TBI = .93 cellular tissue based products placed Dunlap the center L ABI= .95, TBI= .67 since last visit: Pain Present Now: No Electronic Signature(s) Signed: 01/10/2020 9:27:12 AM By: Minerva Fester Entered By: Minerva Fester on 01/07/2020 10:56:28 -------------------------------------------------------------------------------- Encounter Discharge Information Details Patient Name: Date of Service: Jessica Dunlap, Jessica L. 01/07/2020 10:30 A M Medical Record Number: [id number] Patient Account Number: [id number] Date of Birth/Sex: Treating RN: 1955-11-05 (64 y.o. Clearnce Sorrel Primary Care Yanet Balliet: [employer] Other Clinician: Referring Adalae Baysinger: Treating Ryelynn Guedea/Extender: Garfield Cornea Dunlap Treatment: 1 Encounter Discharge  Information Items Post Procedure Vitals Discharge Condition: Stable Temperature (F): 97.8 Ambulatory Status: Cane Pulse (bpm): 134 Discharge Destination: Home Respiratory Rate (breaths/min): 14 Transportation: Private Auto Blood Pressure (mmHg): 169/84 Accompanied By: self Schedule Follow-up Appointment: Yes Clinical Summary of Care: Patient Declined Electronic Signature(s) Signed: 01/07/2020 5:57:27 PM By: Kela Millin Entered By: Kela Millin on 01/07/2020 12:43:14 -------------------------------------------------------------------------------- Lower Extremity Assessment Details Patient Name: Date of Service: Jessica Dunlap, Jessica MARET. 01/07/2020 10:30 A M Medical Record Number: [id number] Patient Account Number: [id number] Date of Birth/Sex: Treating RN: October 26, 1955 (64 y.o. Clearnce Sorrel Primary Care Jahnessa Vanduyn: [employer] Other Clinician: Minerva Fester Referring Keyaira Clapham: Treating Noe Goyer/Extender: Garfield Cornea Dunlap Treatment: 1 Edema Assessment Assessed: [Left: No] [Right: No] Edema: [Left: No] [Right: No] Calf Left: Right: Point of Measurement: cm From Medial Instep 28 cm 28 cm Ankle Left: Right: Point of Measurement: cm From Medial Instep 19 cm 20 cm Vascular Assessment Pulses: Dorsalis Pedis Palpable: [Left:Yes] [Right:Yes] Electronic Signature(s) Signed: 01/07/2020 5:57:27 PM By: Kela Millin Signed: 01/10/2020 9:27:12 AM By: Minerva Fester Entered By: Minerva Fester on 01/07/2020 11:06:39 -------------------------------------------------------------------------------- Multi Wound Chart Details Patient Name: Date of Service: Jessica Dunlap, Jessica L. 01/07/2020 10:30 A M Medical Record Number: [id number] Patient Account Number: [id number] Date of Birth/Sex: Treating RN: July 30, 1955 (64 y.o. Clearnce Sorrel Primary Care Mannix Kroeker: [employer] Other Clinician: Referring Oswin Griffith: Treating Deaire Mcwhirter/Extender: Garfield Cornea Dunlap Treatment: 1 Vital Signs Height(Dunlap): 63 Pulse(bpm): 134 Weight(lbs): 131 Blood Pressure(mmHg): 169/84 Body Mass Index(BMI): 23 Temperature(F): 97.8 Respiratory Rate(breaths/min): 14 Photos: [1:No Photos Left, Lateral Foot] [2:No Photos Right T Great oe] [N/A:N/A N/A] Wound Location: [1:Gradually Appeared] [2:Gradually Appeared] [N/A:N/A] Wounding Event: [1:Diabetic Wound/Ulcer of the Lower] [2:Diabetic Wound/Ulcer of the Lower] [N/A:N/A] Primary Etiology: [1:Extremity 10/30/2019] [2:Extremity 10/11/2019] [N/A:N/A] Date Acquired: [1:1] [2:1] [N/A:N/A] Weeks of Treatment: [1:Open] [2:Open] [N/A:N/A] Wound Status: [1:0.8x1.1x0.1] [2:0.2x0.1x0.1] [N/A:N/A] Measurements L x W x D (cm) [1:0.691] [2:0.016] [N/A:N/A] A (cm) :  rea [1:0.069] [2:0.002] [N/A:N/A] Volume (cm) : [1:0.00%] [2:66.00%] [N/A:N/A] % Reduction Dunlap A [1:rea: 0.00%] [2:77.80%] [N/A:N/A] % Reduction Dunlap Volume: [1:Grade 1] [2:Grade 1] [N/A:N/A] Classification: [1:Debridement - Excisional] [2:N/A] [N/A:N/A] Debridement: [1:12:00] [2:N/A] [N/A:N/A] Pre-procedure Verification/Time Out Taken: [1:Other] [2:N/A] [N/A:N/A] Pain Control: [1:Subcutaneous] [2:N/A] [N/A:N/A] Tissue Debrided: [1:Skin/Subcutaneous Tissue] [2:N/A] [N/A:N/A] Level: [1:0.88] [2:N/A] [N/A:N/A] Debridement A (sq cm): [1:rea Curette] [2:N/A] [N/A:N/A] Instrument: [1:Moderate] [2:N/A] [N/A:N/A] Bleeding: [1:Silver Nitrate] [2:N/A] [N/A:N/A] Hemostasis A chieved: [1:0] [2:N/A] [N/A:N/A] Procedural Pain: [1:0] [2:N/A] [N/A:N/A] Post Procedural Pain: [1:Procedure was tolerated well] [2:N/A] [N/A:N/A] Debridement Treatment Response: [1:0.8x1.1x0.1] [2:N/A] [N/A:N/A] Post Debridement Measurements L x W x D (cm) [1:0.069] [2:N/A] [N/A:N/A] Post Debridement Volume: (cm) [1:Debridement] [2:N/A] [N/A:N/A] Treatment Notes Wound #1 (Left, Lateral Foot) 1. Cleanse With Wound Cleanser 3. Primary Dressing Applied Polymem 4. Secondary  Dressing Dry Gauze Roll Gauze 7. Footwear/Offloading device applied Diabetic shoe Wound #2 (Right Toe Great) 1. Cleanse With Wound Cleanser 3. Primary Dressing Applied Polymem 4. Secondary Dressing Dry Gauze Roll Gauze 7. Footwear/Offloading device applied Diabetic shoe Electronic Signature(s) Signed: 01/07/2020 5:57:27 PM By: Kela Millin Signed: 01/11/2020 8:12:39 AM By: Linton Ham MD Entered By: Linton Ham on 01/07/2020 12:46:37 -------------------------------------------------------------------------------- Multi-Disciplinary Care Plan Details Patient Name: Date of Service: Jessica Dunlap, Jessica L. 01/07/2020 10:30 A M Medical Record Number: [id number] Patient Account Number: [id number] Date of Birth/Sex: Treating RN: Nov 06, 1955 (64 y.o. Clearnce Sorrel Primary Care Lelar Farewell: [employer] Other Clinician: Referring Eliese Kerwood: Treating Carlyann Placide/Extender: Garfield Cornea Dunlap Treatment: 1 Active Inactive Nutrition Nursing Diagnoses: Potential for alteratiion Dunlap Nutrition/Potential for imbalanced nutrition Goals: Patient/caregiver verbalizes understanding of need to maintain therapeutic glucose control per primary care physician Date Initiated: 12/31/2019 Target Resolution Date: 02/04/2020 Goal Status: Active Interventions: Provide education on elevated blood sugars and impact on wound healing Notes: Pain, Acute or Chronic Nursing Diagnoses: Pain, acute or chronic: actual or potential Goals: Patient/caregiver will verbalize adequate pain control between visits Date Initiated: 12/31/2019 Target Resolution Date: 02/04/2020 Goal Status: Active Interventions: Provide education on pain management Notes: Wound/Skin Impairment Nursing Diagnoses: Impaired tissue integrity Goals: Ulcer/skin breakdown will have a volume reduction of 30% by week 4 Date Initiated: 12/31/2019 Target Resolution Date: 02/04/2020 Goal Status:  Active Interventions: Provide education on ulcer and skin care Notes: Electronic Signature(s) Signed: 01/07/2020 5:57:27 PM By: Kela Millin Entered By: Kela Millin on 01/07/2020 12:01:49 -------------------------------------------------------------------------------- Pain Assessment Details Patient Name: Date of Service: Jessica Dunlap, Jessica Dunlap. 01/07/2020 10:30 A M Medical Record Number: [id number] Patient Account Number: [id number] Date of Birth/Sex: Treating RN: 06/20/1956 (64 y.o. Clearnce Sorrel Primary Care Nijae Doyel: [employer] Other Clinician: Minerva Fester Referring Arinze Rivadeneira: Treating Maddock Finigan/Extender: Garfield Cornea Dunlap Treatment: 1 Active Problems Location of Pain Severity and Description of Pain Patient Has Paino No Site Locations Rate the pain. Rate the pain. Current Pain Level: 0 Worst Pain Level: 10 Least Pain Level: 0 Pain Management and Medication Current Pain Management: Electronic Signature(s) Signed: 01/07/2020 5:57:27 PM By: Kela Millin Signed: 01/10/2020 9:27:12 AM By: Minerva Fester Entered By: Minerva Fester on 01/07/2020 11:01:22 -------------------------------------------------------------------------------- Patient/Caregiver Education Details Patient Name: Date of Service: Jessica Dunlap, Jessica Dunlap 7/9/2021andnbsp10:30 A M Medical Record Number: [id number] Patient Account Number: [id number] Date of Birth/Gender: Treating RN: May 18, 1956 (64 y.o. Clearnce Sorrel Primary Care Physician: [employer] Other Clinician: Referring Physician: Treating Physician/Extender: Garfield Cornea Dunlap Treatment: 1 Education Assessment Education Provided To: Patient Education Topics Provided Elevated Blood Sugar/ Impact on Healing: Handouts: Elevated Blood Sugars:  How Do They Affect Wound Healing Methods: Explain/Verbal Responses: State content correctly Pain: Handouts: A Guide to Pain Control Methods:  Explain/Verbal Responses: State content correctly Wound/Skin Impairment: Handouts: Caring for Your Ulcer Methods: Explain/Verbal Responses: State content correctly Electronic Signature(s) Signed: 01/07/2020 5:57:27 PM By: Kela Millin Entered By: Kela Millin on 01/07/2020 12:02:28 -------------------------------------------------------------------------------- Wound Assessment Details Patient Name: Date of Service: Jessica Dunlap, Jessica L. 01/07/2020 10:30 A M Medical Record Number: [id number] Patient Account Number: [id number] Date of Birth/Sex: Treating RN: [birth year] (64 y.o. Clearnce Sorrel Primary Care Ransome Helwig: [employer] Other Clinician: Minerva Fester Referring Flossie Wexler: Treating Charmane Protzman/Extender: Garfield Cornea Dunlap Treatment: 1 Wound Status Wound Number: 1 Primary Etiology: Diabetic Wound/Ulcer of the Lower Extremity Wound Location: Left, Lateral Foot Wound Status: Open Wounding Event: Gradually Appeared Date Acquired: 10/30/2019 Weeks Of Treatment: 1 Clustered Wound: No Photos Photo Uploaded By: Mikeal Hawthorne on 01/07/2020 13:51:15 Wound Measurements Length: (cm) 0.8 Width: (cm) 1.1 Depth: (cm) 0.1 Area: (cm) 0.691 Volume: (cm) 0.069 % Reduction Dunlap Area: 0% % Reduction Dunlap Volume: 0% Wound Description Classification: Grade 1 Treatment Notes Wound #1 (Left, Lateral Foot) 1. Cleanse With Wound Cleanser 3. Primary Dressing Applied Polymem 4. Secondary Dressing Dry Gauze Roll Gauze 7. Footwear/Offloading device applied Diabetic shoe Electronic Signature(s) Signed: 01/07/2020 5:57:27 PM By: Kela Millin Signed: 01/10/2020 9:27:12 AM By: Minerva Fester Entered By: Minerva Fester on 01/07/2020 11:24:00 -------------------------------------------------------------------------------- Wound Assessment Details Patient Name: Date of Service: Jessica Dunlap, Jessica L. 01/07/2020 10:30 A M Medical Record Number: [id number] Patient Account  Number: [id number] Date of Birth/Sex: Treating RN: [birth year] (64 y.o. Hollie Salk, [place] Primary Care Lene Mckay: [employer] Other Clinician: Minerva Fester Referring Dariusz Brase: Treating Hayato Guaman/Extender: Garfield Cornea Dunlap Treatment: 1 Wound Status Wound Number: 2 Primary Etiology: Diabetic Wound/Ulcer of the Lower Extremity Wound Location: Right T Great oe Wound Status: Open Wounding Event: Gradually Appeared Date Acquired: 10/11/2019 Weeks Of Treatment: 1 Clustered Wound: No Photos Photo Uploaded By: Mikeal Hawthorne on 01/07/2020 13:51:15 Wound Measurements Length: (cm) 0.2 Width: (cm) 0.1 Depth: (cm) 0.1 Area: (cm) 0.016 Volume: (cm) 0.002 % Reduction Dunlap Area: 66% % Reduction Dunlap Volume: 77.8% Wound Description Classification: Grade 1 Treatment Notes Wound #2 (Right Toe Great) 1. Cleanse With Wound Cleanser 3. Primary Dressing Applied Polymem 4. Secondary Dressing Dry Gauze Roll Gauze 7. Footwear/Offloading device applied Diabetic shoe Electronic Signature(s) Signed: 01/07/2020 5:57:27 PM By: Kela Millin Signed: 01/10/2020 9:27:12 AM By: Minerva Fester Entered By: Minerva Fester on 01/07/2020 11:24:01 -------------------------------------------------------------------------------- Vitals Details Patient Name: Date of Service: Jessica Dunlap, Jessica L. 01/07/2020 10:30 A M Medical Record Number: [id number] Patient Account Number: [id number] Date of Birth/Sex: Treating RN: [birth year] (63 y.o. Hollie Salk, [place]rene [place] Primary Care Latrelle Bazar: [employer] Other Clinician: Minerva Fester Referring Jaylene Schrom: Treating Ainslie Mazurek/Extender: Garfield Cornea Dunlap Treatment: 1 Vital Signs Time Taken: 10:55 Temperature (F): 97.8 Height (Dunlap): 63 Pulse (bpm): 134 Weight (lbs): 131 Respiratory Rate (breaths/min): 14 Body Mass Index (BMI): 23.2 Blood Pressure (mmHg): 169/84 Reference Range: 80 - 120 mg / dl Electronic  Signature(s) Signed: 01/10/2020 9:27:12 AM By: Minerva Fester Entered By: Minerva Fester on 01/07/2020 11:00:51

## 2020-01-11 NOTE — Progress Notes (Addendum)
LINDSIE, SIMAR ([id number]) Visit Report for 01/07/2020 Debridement Details Patient Name: Date of Service: FA IN, [place]. 01/07/2020 10:30 A M Medical Record Number: [id number] Patient Account Number: [id number] Date of Birth/Sex: Treating RN: 1955-10-04 (64 y.o. Clearnce Sorrel Primary Care Provider: [employer] Other Clinician: Referring Provider: Treating Provider/Extender: Garfield Cornea in Treatment: 1 Debridement Performed for Assessment: Wound #1 Left,Lateral Foot Performed By: Physician Ricard Dillon., MD Debridement Type: Debridement Severity of Tissue Pre Debridement: Fat layer exposed Level of Consciousness (Pre-procedure): Awake and Alert Pre-procedure Verification/Time Out Yes - 12:00 Taken: Start Time: 12:00 Pain Control: Other : benzocaine, 20% T Area Debrided (L x W): otal 0.8 (cm) x 1.1 (cm) = 0.88 (cm) Tissue and other material debrided: Viable, Non-Viable, Subcutaneous Level: Skin/Subcutaneous Tissue Debridement Description: Excisional Instrument: Curette Bleeding: Moderate Hemostasis Achieved: Silver Nitrate End Time: 12:00 Procedural Pain: 0 Post Procedural Pain: 0 Response to Treatment: Procedure was tolerated well Level of Consciousness (Post- Awake and Alert procedure): Post Debridement Measurements of Total Wound Length: (cm) 0.8 Width: (cm) 1.1 Depth: (cm) 0.1 Volume: (cm) 0.069 Character of Wound/Ulcer Post Debridement: Improved Severity of Tissue Post Debridement: Fat layer exposed Post Procedure Diagnosis Same as Pre-procedure Electronic Signature(s) Signed: 01/07/2020 5:57:27 PM By: Kela Millin Signed: 01/11/2020 8:12:39 AM By: Linton Ham MD Entered By: Linton Ham on 01/07/2020 12:46:49 -------------------------------------------------------------------------------- HPI Details Patient Name: Date of Service: FA IN, Bryant L. 01/07/2020 10:30 A M Medical Record Number: [id number] Patient Account  Number: [id number] Date of Birth/Sex: Treating RN: 1955-12-08 (64 y.o. Clearnce Sorrel Primary Care Provider: [employer] Other Clinician: Referring Provider: Treating Provider/Extender: Garfield Cornea in Treatment: 1 History of Present Illness HPI Description: ADMISSION 12/31/2019 This is a 64 year old woman with type 2 diabetes. She has been dealing with an area on her tip of her right great toe and the outside of her left foot since mid April. She has been following with Dr. Marcheta Grammes podiatry in [place]. She has recently moved to [place]. She has been using Silvadene cream to the wound. She has a AFO brace brace to try and straighten her left ankle but the patient states it hurts and she sometimes does not use it. She tries to be active including going to the store etc. etc. She also has significant neuropathy in both feet. Past medical history includes Karlene Lineman, cataracts, type 2 diabetes with neuropathy, history of Graves' disease now hypothyroid, autoimmune hemolytic anemia, quit smoking 10 years ago, hypertension Arterial studies; done this year she had ABI in the right of 0.94 TBI of 0.93. On the left 0.95 and a TBI of 0.67 7/9; this patient has an area in the tip of her right great toe and outside of her left foot at roughly the base of the fifth metatarsal. We applied PolyMem to both of these wounds last week. The toe is improved however the area on the left lateral foot is not. She tells me she does not wear the brace when she is in her home but she is trying to stay off the wound is much as possible by not being on her foot Electronic Signature(s) Signed: 01/11/2020 8:12:39 AM By: Linton Ham MD Entered By: Linton Ham on 01/07/2020 12:48:04 -------------------------------------------------------------------------------- Physical Exam Details Patient Name: Date of Service: FA IN, Rema L. 01/07/2020 10:30 A M Medical Record Number:  [id number] Patient Account Number: [id number] Date of Birth/Sex: Treating RN: Nov 02, 1955 (64 y.o. Clearnce Sorrel Primary Care Provider: [employer] Other  Clinician: Referring Provider: Treating Provider/Extender: Garfield Cornea in Treatment: 1 Constitutional Patient is hypertensive.. Pulse regular and within target range for patient.Marland Kitchen Respirations regular, non-labored and within target range.. Temperature is normal and within the target range for the patient.Marland Kitchen Appears in no distress. Cardiovascular Pedal pulses palpable. Integumentary (Hair, Skin) No erythema around any of the wounds. Notes Wound exam; On the left or areas on the left lateral foot at the base of the fifth metatarsal. Still callus and thick skin and subcutaneous tissue from around the wound. Debriding this area is very difficult it bleeds freely although she is not on any anticoagulants. The small area on the tip of her right great toe looks as though this is close to closing. Electronic Signature(s) Signed: 01/11/2020 8:12:39 AM By: Linton Ham MD Entered By: Linton Ham on 01/07/2020 12:50:08 -------------------------------------------------------------------------------- Physician Orders Details Patient Name: Date of Service: FA IN, Christol L. 01/07/2020 10:30 A M Medical Record Number: [id number] Patient Account Number: [id number] Date of Birth/Sex: Treating RN: January 14, 1956 (64 y.o. 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 Dressing Change Frequency Wound #1 Left,Lateral Foot Change Dressing every other day. Wound #2 Right T Great oe Change Dressing every other day. Wound Cleansing May shower and wash wound with soap and water. - when dressing is changed Primary Wound Dressing Wound #1 Left,Lateral Foot Polymem Wound #2 Right T Great oe Polymem Secondary Dressing Wound #1 Left,Lateral Foot Foam - foam donut Kerlix/Rolled Gauze Dry Gauze Wound #2 Right T Great oe Kerlix/Rolled Gauze Dry Gauze Electronic Signature(s) Signed: 01/07/2020 5:57:27 PM By: Kela Millin Signed: 01/11/2020 8:12:39 AM By: Linton Ham MD Entered By: Kela Millin on 01/07/2020 12:01:41 -------------------------------------------------------------------------------- Problem List Details Patient Name: Date of Service: FA IN, Yazlyn L. 01/07/2020 10:30 A M Medical Record Number: [id number] Patient Account Number: [id number] Date of Birth/Sex: Treating RN: [birth year] (64 y.o. Hollie Salk, [place]rene [place] Primary Care Provider: [employer] Other Clinician: Referring Provider: Treating Provider/Extender: Garfield Cornea in Treatment: 1 Active Problems ICD-10 Encounter Code Description Active Date MDM Diagnosis E11.621 Type 2 diabetes mellitus with foot ulcer 12/31/2019 No Yes L97.522 Non-pressure chronic ulcer of other part of left foot with fat layer exposed 12/31/2019 No Yes L97.511 Non-pressure chronic ulcer of other part of right foot limited to breakdown of 12/31/2019 No Yes skin E11.42 Type 2 diabetes mellitus with diabetic polyneuropathy 12/31/2019 No Yes M21.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 01/07/2020 10:30 A M Medical Record Number: [id number] Patient Account Number: [id number] Date of Birth/Sex: Treating RN: [birth year] (64 y.o. Clearnce Sorrel Primary Care Provider: [employer] Other Clinician: Referring Provider: Treating Provider/Extender: Garfield Cornea in Treatment: 1 Subjective History of Present Illness (HPI) ADMISSION 12/31/2019 This is a 64 year old woman with type 2  diabetes. She has been dealing with an area on her tip of her right great toe and the outside of her left foot since mid April. She has been following with Dr. Marcheta Grammes podiatry in [place]. She has recently moved to [place]. She has been using Silvadene cream to the wound. She has a AFO brace brace to try and straighten her left ankle but the patient states it hurts and she sometimes does not use it. She tries to be active including going to the store etc. etc. She also has significant neuropathy in both feet. Past medical history includes Karlene Lineman, cataracts, type 2 diabetes with neuropathy, history of Graves' disease now hypothyroid, autoimmune hemolytic anemia, quit smoking 10 years ago, hypertension Arterial studies; done this year she had ABI in the right of 0.94 TBI of 0.93. On the left 0.95 and a TBI of 0.67 7/9; this patient has an area in the tip of her right great toe and outside of her left foot at roughly the base of the fifth metatarsal. We applied PolyMem to both of these wounds last week. The toe is improved however the area on the left lateral foot is not. She tells me she does not wear the brace when she is in her home but she is trying to stay off the wound is much as possible by not being on her foot Objective Constitutional Patient is hypertensive.. Pulse  regular and within target range for patient.Marland Kitchen Respirations regular, non-labored and within target range.. Temperature is normal and within the target range for the patient.Marland Kitchen Appears in no distress. Vitals Time Taken: 10:55 AM, Height: 63 in, Weight: 131 lbs, BMI: 23.2, Temperature: 97.8 F, Pulse: 134 bpm, Respiratory Rate: 14 breaths/min, Blood Pressure: 169/84 mmHg. Cardiovascular Pedal pulses palpable. General Notes: Wound exam; ooOn the left or areas on the left lateral foot at the base of the fifth metatarsal. Still callus and thick skin and subcutaneous tissue from around the wound. Debriding this area is very difficult it bleeds freely although she is not on any anticoagulants. ooThe small area on the tip of her right great toe looks as though this is close to closing. Integumentary (Hair, Skin) No erythema around any of the wounds. Wound #1 status is Open. Original cause of wound was Gradually Appeared. The wound is located on the Left,Lateral Foot. The wound measures 0.8cm length x 1.1cm width x 0.1cm depth; 0.691cm^2 area and 0.069cm^3 volume. Wound #2 status is Open. Original cause of wound was Gradually Appeared. The wound is located on the Right T Great. The wound measures 0.2cm length x oe 0.1cm width x 0.1cm depth; 0.016cm^2 area and 0.002cm^3 volume. 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 There was a Excisional Skin/Subcutaneous Tissue Debridement with a total area of 0.88 sq cm  performed by Ricard Dillon., MD. With the following instrument(s): Curette to remove Viable and Non-Viable tissue/material. Material removed includes Subcutaneous Tissue after achieving pain control using Other (benzocaine, 20%). No specimens were taken. A time out was conducted at 12:00, prior to the start of the procedure.  A Moderate amount of bleeding was controlled with Silver Nitrate. The procedure was tolerated well with a pain level of 0 throughout and a pain level of 0 following the procedure. Post Debridement Measurements: 0.8cm length x 1.1cm width x 0.1cm depth; 0.069cm^3 volume. Character of Wound/Ulcer Post Debridement is improved. Severity of Tissue Post Debridement is: Fat layer exposed. Post procedure Diagnosis Wound #1: Same as Pre-Procedure Plan Follow-up Appointments: Return Appointment in 2 weeks. Dressing Change Frequency: Wound #1 Left,Lateral Foot: Change Dressing every other day. Wound #2 Right T Great: oe Change Dressing every other day. Wound Cleansing: May shower and wash wound with soap and water. - when dressing is changed Primary Wound Dressing: Wound #1 Left,Lateral Foot: Polymem Wound #2 Right T Great: oe Polymem Secondary Dressing: Wound #1 Left,Lateral Foot: Foam - foam donut Kerlix/Rolled Gauze Dry Gauze Wound #2 Right T Great: oe Kerlix/Rolled Gauze Dry Gauze 1. Polymen to both wounds 2. I have asked her to continue to use the brace even when she is walking indoors to avoid the pressure on the left lateral foot if indeed it is effective in doing that. I had some thoughts about putting a total contact cast on the left foot although I am not sure she would be able to tolerate it. She asked about other offloading devices including a knee scooter I wonder whether she would be able to tolerate that. 3. Notable for the fact that debriding the area on the left lateral foot which is certainly needs is not easy. It bleeds very freely although she  says she is not on any antiplatelet or anticoagulant drugs Electronic Signature(s) Signed: 01/11/2020 8:12:39 AM By: Linton Ham MD Entered By: Linton Ham on 01/07/2020 12:51:31 -------------------------------------------------------------------------------- SuperBill Details Patient Name: Date of Service: FA IN, [name]. 01/07/2020 Medical Record Number: [id number] Patient Account Number: [id number] Date of Birth/Sex: Treating RN: 05-28-1956 (64 y.o. 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, not elsewhere classified, left ankle Facility Procedures CPT4 Code: [id number] Description: 50932 - DEB SUBQ TISSUE 20 SQ CM/< ICD-10 Diagnosis Description L97.522 Non-pressure chronic ulcer of other part of left foot with fat layer exposed Modifier: Quantity: 1 Physician Procedures : CPT4 Code Description Modifier [id number] - WC PHYS SUBQ TISS 20 SQ CM ICD-10 Diagnosis Description L97.522 Non-pressure chronic ulcer of other part of left foot with fat layer exposed Quantity: 1 Electronic Signature(s) Signed: 01/11/2020 8:12:39 AM By: Linton Ham MD Entered By: Linton Ham on 01/07/2020 12:51:41

## 2020-01-21 ENCOUNTER — Encounter (HOSPITAL_BASED_OUTPATIENT_CLINIC_OR_DEPARTMENT_OTHER): Payer: 59 | Admitting: Internal Medicine

## 2020-01-24 ENCOUNTER — Encounter (HOSPITAL_BASED_OUTPATIENT_CLINIC_OR_DEPARTMENT_OTHER): Payer: 59 | Admitting: Internal Medicine

## 2020-01-24 DIAGNOSIS — E11621 Type 2 diabetes mellitus with foot ulcer: Secondary | ICD-10-CM | POA: Diagnosis not present

## 2020-01-26 NOTE — Progress Notes (Signed)
SHEARON, CLONCH ([id number]) Visit Report for 01/24/2020 Debridement Details Patient Name: Date of Service: Jessica Dunlap, Jessica Dunlap 01/24/2020 1:45 PM Medical Record Number: [id number] Patient Account Number: [id number] Date of Birth/Sex: Treating RN: Aug 01, 1955 (64 y.o. Nancy Fetter Primary Care Provider: [employer] Other Clinician: Referring Provider: Treating Provider/Extender: Garfield Cornea [place]lap Treatment: 3 Debridement Performed for Assessment: Wound #1 Left,Lateral Foot Performed By: Physician Ricard Dillon., MD Debridement Type: Debridement Severity of Tissue Pre Debridement: Fat layer exposed Level of Consciousness (Pre-procedure): Awake and Alert Pre-procedure Verification/Time Out Yes - 15:03 Taken: Start Time: 15:03 T Area Debrided (L x W): otal 1 (cm) x 1.1 (cm) = 1.1 (cm) Tissue and other material debrided: Viable, Non-Viable, Callus, Subcutaneous Level: Skin/Subcutaneous Tissue Debridement Description: Excisional Instrument: Blade, Forceps Bleeding: Moderate Hemostasis Achieved: Silver Nitrate End Time: 15:04 Procedural Pain: 0 Post Procedural Pain: 0 Response to Treatment: Procedure was tolerated well Level of Consciousness (Post- Awake and Alert procedure): Post Debridement Measurements of Total Wound Length: (cm) 1 Width: (cm) 1.1 Depth: (cm) 0.1 Volume: (cm) 0.086 Character of Wound/Ulcer Post Debridement: Improved Severity of Tissue Post Debridement: Fat layer exposed Post Procedure Diagnosis Same as Pre-procedure Electronic Signature(s) Signed: 01/24/2020 6:00:25 PM By: Linton Ham MD Signed: 01/26/2020 6:13:34 PM By: Levan Hurst RN, BSN Entered By: Linton Ham on 01/24/2020 15:42:39 -------------------------------------------------------------------------------- Debridement Details Patient Name: Date of Service: Jessica Dunlap, Jessica Dunlap. 01/24/2020 1:45 PM Medical Record Number: [id number] Patient Account Number:  [id number] Date of Birth/Sex: Treating RN: [birth year] (64 y.o. Nancy Fetter Primary Care Provider: [employer] Other Clinician: Referring Provider: Treating Provider/Extender: Garfield Cornea Dunlap Treatment: 3 Debridement Performed for Assessment: Wound #2 Right T Great oe Performed By: Physician Ricard Dillon., MD Debridement Type: Debridement Severity of Tissue Pre Debridement: Fat layer exposed Level of Consciousness (Pre-procedure): Awake and Alert Pre-procedure Verification/Time Out Yes - 15:03 Taken: Start Time: 15:03 T Area Debrided (L x W): otal 0.2 (cm) x 0.3 (cm) = 0.06 (cm) Tissue and other material debrided: Viable, Non-Viable, Callus, Skin: Epidermis Level: Skin/Epidermis Debridement Description: Selective/Open Wound Instrument: Curette Bleeding: Moderate Hemostasis Achieved: Pressure End Time: 15:04 Procedural Pain: 0 Post Procedural Pain: 0 Response to Treatment: Procedure was tolerated well Level of Consciousness (Post- Awake and Alert procedure): Post Debridement Measurements of Total Wound Length: (cm) 0.2 Width: (cm) 0.3 Depth: (cm) 0.1 Volume: (cm) 0.005 Character of Wound/Ulcer Post Debridement: Improved Severity of Tissue Post Debridement: Fat layer exposed Post Procedure Diagnosis Same as Pre-procedure Electronic Signature(s) Signed: 01/24/2020 6:00:25 PM By: Linton Ham MD Signed: 01/26/2020 6:13:34 PM By: Levan Hurst RN, BSN Entered By: Linton Ham on 01/24/2020 15:42:48 -------------------------------------------------------------------------------- HPI Details Patient Name: Date of Service: Jessica Dunlap, Jessica L. 01/24/2020 1:45 PM Medical Record Number: [id number] Patient Account Number: [id number] Date of Birth/Sex: Treating RN: 1955-10-15 (64 y.o. Nancy Fetter Primary Care Provider: [employer] Other Clinician: Referring Provider: Treating Provider/Extender: Garfield Cornea [place] Treatment: 3 History of Present Illness HPI Description: ADMISSION 12/31/2019 This is a 64 year old woman with type 2 diabetes. She has been dealing with an area on her tip of her right great toe and the outside of her left foot since mid April. She has been following with Dr. Marcheta Grammes podiatry [place]. She has recently moved to [place]. She has been using Silvadene cream to the wound. She has a AFO brace brace to try and straighten her left ankle but the patient states it hurts and she  sometimes does not use it. She tries to be active including going to the store etc. etc. She also has significant neuropathy [place] both feet. Past medical history includes Karlene Lineman, cataracts, type 2 diabetes with neuropathy, history of Graves' disease now hypothyroid, autoimmune hemolytic anemia, quit smoking 10 years ago, hypertension Arterial studies; done this year she had ABI [place] the right of 0.94 TBI of 0.93. On the left 0.95 and a TBI of 0.67 7/9; this patient has an area [place] the tip of her right great toe and outside of her left foot at roughly the base of the fifth metatarsal. We applied PolyMem to both of these wounds last week. The toe is improved however the area on the left lateral foot is not. She tells me she does not wear the brace when she is [place] home but she is trying to stay off the wound is much as possible by not being on her foot 7/26; the patient arrives with thick callus around the wound on the left lateral foot and thick tissue around the area on her right great toe. Both of these require debridement. We have been using polymen Ag Electronic Signature(s) Signed: 01/24/2020 6:00:25 PM By: Linton Ham MD Signed: 01/24/2020 6:00:25 PM By: Linton Ham MD Entered By: Linton Ham on 01/24/2020 15:43:47 -------------------------------------------------------------------------------- Physical Exam Details Patient Name: Date of Service: Jessica Dunlap, Jessica Dunlap. 01/24/2020 1:45  PM Medical Record Number: [id number] Patient Account Number: [id number] Date of Birth/Sex: Treating RN: 02-01-1956 (64 y.o. Nancy Fetter Primary Care Provider: [employer] Other Clinician: Referring Provider: Treating Provider/Extender: Garfield Cornea [place] Treatment: 3 Constitutional Sitting or standing Blood Pressure is within target range for patient.. Pulse regular and within target range for patient.Marland Kitchen Respirations regular, non-labored and within target range.. Temperature is normal and within the target range for the patient.Marland Kitchen Appears [place] no distress. Notes Wound exam On the left lateral foot base of the fifth metatarsal head. #5 curette removing callus skin and subcutaneous tissue around the wound margin. Hemostasis with a single silver nitrate and direct pressure The right great toe tip also required debridement Electronic Signature(s) Signed: 01/24/2020 6:00:25 PM By: Linton Ham MD Entered By: Linton Ham on 01/24/2020 15:44:34 -------------------------------------------------------------------------------- Physician Orders Details Patient Name: Date of Service: Jessica Dunlap, Jessica Dunlap. 01/24/2020 1:45 PM Medical Record Number: [id number] Patient Account Number: [id number] Date of Birth/Sex: Treating RN: 12-Nov-1955 (64 y.o. 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 Dressing Change Frequency Wound #1 Left,Lateral Foot Change Dressing every other day. Wound #2 Right T Great oe Change Dressing every other day. Wound Cleansing May shower and wash wound with soap and water. - when dressing is changed Primary Wound Dressing Wound #1 Left,Lateral Foot Polymem Wound #2 Right T Great oe Polymem Secondary Dressing Wound #1 Left,Lateral Foot Foam - foam donut Kerlix/Rolled Gauze Dry Gauze Wound #2 Right T Great oe Kerlix/Rolled Gauze Dry Gauze Electronic Signature(s) Signed: 01/24/2020 6:00:25 PM By: Linton Ham MD Signed: 01/26/2020 6:13:34 PM By:  Levan Hurst RN, BSN Entered By: Levan Hurst on 01/24/2020 15:09:23 -------------------------------------------------------------------------------- Problem List Details Patient Name: Date of Service: Jessica Dunlap, Jessica Dunlap 01/24/2020 1:45 PM Medical Record Number: [id number] Patient Account Number: [id number] Date of Birth/Sex: Treating RN: Nov 28, 1955 (64 y.o. Benjamine Sprague, Briant Cedar Primary Care Provider: [employer] Other Clinician: Referring Provider: Treating Provider/Extender: Garfield Cornea [place] Treatment: 3 Active Problems ICD-10 Encounter Code Description Active Date MDM Diagnosis E11.621 Type 2 diabetes mellitus with foot ulcer 12/31/2019 No Yes L97.522 Non-pressure chronic ulcer of other part of left foot with fat layer exposed 12/31/2019 No Yes L97.511 Non-pressure chronic ulcer of other part of right foot limited to breakdown of 12/31/2019 No Yes skin E11.42 Type 2 diabetes mellitus with diabetic polyneuropathy 12/31/2019 No Yes M21.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 01/24/2020 1:45 PM Medical Record Number: [id number] Patient Account Number: [id number] Date of Birth/Sex: Treating RN: 1956/03/20 (64 y.o. Nancy Fetter Primary Care Provider: [employer] Other Clinician: Referring Provider: Treating Provider/Extender: Garfield Cornea [place] Treatment: 3 Subjective History of Present Illness (HPI) ADMISSION 12/31/2019 This is a 64 year old woman with type 2 diabetes. She has been dealing with an area on her tip of her right great toe and the outside of her left foot since mid April. She has been following with Dr. Marcheta Grammes podiatry [place]. She has recently moved to [place]. She has been using Silvadene cream to the wound. She has a AFO brace brace to try and straighten her left ankle but the patient states it hurts and she sometimes does not use it. She tries to be active including going to the store etc. etc. She also has significant neuropathy [place] both feet. Past medical history includes Karlene Lineman, cataracts, type 2 diabetes with neuropathy, history of Graves' disease now hypothyroid, autoimmune hemolytic anemia, quit smoking 10 years ago, hypertension Arterial studies; done this year she had ABI [place] the right of 0.94 TBI of 0.93. On the left 0.95 and a TBI of 0.67 7/9; this patient has an area [place] the tip of her right great toe and outside of her left foot at roughly the base of the fifth metatarsal. We applied PolyMem to both of these wounds last week. The toe is improved however the area on the left lateral foot is not. She tells me she does not wear the brace when she is [place] home but she is trying to stay off the wound is much as possible by not being on her foot 7/26; the patient arrives with thick callus around the wound on the left lateral foot and thick tissue  around the area on her right great toe. Both of these require debridement. We have been using polymen Ag Objective Constitutional Sitting or standing Blood Pressure is within target range for patient.. Pulse regular and within target range for patient.Marland Kitchen Respirations regular, non-labored and within target range.. Temperature is normal and within the target range for the patient.Marland Kitchen Appears [place] no distress. Vitals Time Taken: 2:26 PM, Height: 63 [place], Weight: 131 lbs, BMI: 23.2, Temperature: 99.3 F, Pulse: 86 bpm, Respiratory Rate: 18 breaths/min, Blood Pressure: 124/72 mmHg. General Notes: Wound exam ooOn the left lateral foot base of the fifth metatarsal head. #5 curette removing callus skin and subcutaneous  tissue around the wound margin. Hemostasis with a single silver nitrate and direct pressure ooThe right great toe tip also required debridement Integumentary (Hair, Skin) Wound #1 status is Open. Original cause of wound was Gradually Appeared. The wound is located on the Left,Lateral Foot. The wound measures 1cm length x 1.1cm width x 0.1cm depth; 0.864cm^2 area and 0.086cm^3 volume. There is Fat Layer (Subcutaneous Tissue) Exposed exposed. There is no tunneling or undermining noted. There is a medium amount of serosanguineous drainage noted. There is medium (34-66%) red granulation within the wound bed. There is a medium (34-66%) amount of necrotic tissue within the wound bed including Adherent Slough. Wound #2 status is Open. Original cause of wound was Gradually Appeared. The wound is located on the Right T Great. The wound measures 0.2cm length x oe 0.3cm width x 0.1cm depth; 0.047cm^2 area and 0.005cm^3 volume. There is Fat Layer (Subcutaneous Tissue) Exposed exposed. There is no tunneling noted, however, there is undermining starting at 12:00 and ending at 12:00 with a maximum distance of 0.7cm. There is a medium amount of serosanguineous drainage noted. There is large (67-100%) red  granulation within the wound bed. There is no necrotic tissue within the wound bed. 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 There was a Excisional Skin/Subcutaneous Tissue Debridement with a total area of 1.1 sq cm performed by Ricard Dillon., MD. With the following instrument(s): Blade, and Forceps to remove Viable and Non-Viable tissue/material. Material removed includes Callus and Subcutaneous Tissue and. No specimens were taken. A time out was conducted at 15:03, prior to the start of the procedure. A Moderate amount of bleeding was controlled with Silver Nitrate. The procedure was tolerated well with a pain level of 0 throughout and a pain level of 0 following the procedure. Post Debridement Measurements: 1cm length x 1.1cm width x 0.1cm depth; 0.086cm^3 volume. Character of Wound/Ulcer Post Debridement is improved. Severity of Tissue Post Debridement is: Fat layer exposed. Post procedure Diagnosis Wound #1: Same as Pre-Procedure Wound #2 Pre-procedure diagnosis of Wound #2 is a Diabetic Wound/Ulcer of the Lower Extremity located on the Right T Great .Severity of Tissue Pre Debridement is: oe Fat layer exposed. There was a Selective/Open Wound Skin/Epidermis Debridement with a total area of 0.06 sq cm performed by Ricard Dillon., MD. With the following instrument(s): Curette to remove Viable and Non-Viable tissue/material. Material removed includes Callus and Skin: Epidermis and.  No specimens were taken. A time out was conducted at 15:03, prior to the start of the procedure. A Moderate amount of bleeding was controlled with Pressure. The procedure was tolerated well with a pain level of 0 throughout and a pain level of 0 following the procedure. Post Debridement Measurements: 0.2cm length x 0.3cm width x 0.1cm depth; 0.005cm^3 volume. Character of Wound/Ulcer Post Debridement is improved. Severity of Tissue Post Debridement is: Fat layer exposed. Post procedure Diagnosis Wound #2: Same as Pre-Procedure Plan Follow-up Appointments: Return Appointment [place] 2 weeks. Dressing Change Frequency: Wound #1 Left,Lateral Foot: Change Dressing every other day. Wound #2 Right T Great:  oe Change Dressing every other day. Wound Cleansing: May shower and wash wound with soap and water. - when dressing is changed Primary Wound Dressing: Wound #1 Left,Lateral Foot: Polymem Wound #2 Right T Great: oe Polymem Secondary Dressing: Wound #1 Left,Lateral Foot: Foam - foam donut Kerlix/Rolled Gauze Dry Gauze Wound #2 Right T Great: oe Kerlix/Rolled Gauze Dry Gauze 1. I continued with the polymen. I talked to her about offloading this at home. She does not think her brace on the left foot straightens things out at all and she may be right. 2. Could consider a total contact cast if this wound worsens or stalls although I am not exactly sure how she would react to this. Electronic Signature(s) Signed: 01/24/2020 6:00:25 PM By: Linton Ham MD Entered By: Linton Ham on 01/24/2020 15:47:07 -------------------------------------------------------------------------------- SuperBill Details Patient Name: Date of Service: Jessica [place], Jessica [place]. 01/24/2020 Medical Record Number: [id number] Patient Account Number: [id number] Date of Birth/Sex: Treating RN: May 31, 1956 (64 y.o. 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, not elsewhere classified, left ankle Facility Procedures CPT4 Code: [id number] Description: [place] - DEB SUBQ TISSUE 20 SQ CM/< ICD-10 Diagnosis Description L97.522 Non-pressure chronic ulcer of other part of left foot with fat layer exposed Modifier: Quantity: 1 CPT4 Code: [id number] Description: 01751 - DEBRIDE WOUND 1ST 20 SQ CM OR < ICD-10 Diagnosis Description L97.511 Non-pressure chronic ulcer of other part of right foot limited to breakdown of s Modifier: 19 kin Quantity: 1 Physician Procedures : CPT4 Code Description Modifier [id number] - WC PHYS SUBQ TISS 20 SQ CM ICD-10 Diagnosis Description L97.522 Non-pressure chronic ulcer of other part of left foot with fat layer exposed Quantity: 1 : [id number] - WC PHYS DEBR WO ANESTH 20 SQ CM 59 ICD-10 Diagnosis Description L97.511 Non-pressure chronic ulcer of other part of right foot limited to breakdown of skin Quantity: 1 Electronic Signature(s) Signed: 01/24/2020 6:00:25 PM By: Linton Ham MD Entered By: Linton Ham on 01/24/2020 15:47:41

## 2020-01-26 NOTE — Progress Notes (Signed)
Jessica Dunlap, Jessica Dunlap ([id number]) Visit Report for 01/24/2020 Arrival Information Details Patient Name: Date of Service: [place], [place] 01/24/2020 1:45 PM Medical Record Number: [id number] Patient Account Number: [id number] Date of Birth/Sex: Treating RN: Jul 21, 1955 (64 y.o. Orvan Falconer Primary Care Linsey Arteaga: [employer] Other Clinician: Referring Labarron Durnin: Treating Meryem Haertel/Extender: Garfield Cornea in Treatment: 3 Visit Information History Since Last Visit All ordered tests and consults were completed: No Patient Arrived: Kasandra Knudsen Added or deleted any medications: No Arrival Time: 14:20 Any new allergies or adverse reactions: No Accompanied By: self Had a fall or experienced change in No Transfer Assistance: None activities of daily living that may affect Patient Identification Verified: Yes risk of falls: Secondary Verification Process Completed: Yes Signs or symptoms of abuse/neglect since last visito No Patient Requires Transmission-Based Precautions: No Hospitalized since last visit: No Patient Has Alerts: Yes Implantable device outside of the clinic excluding No Patient Alerts: R ABI =.94 TBI = .93 cellular tissue based products placed in the center L ABI= .95, TBI= .67 since last visit: Has Dressing in Place as Prescribed: Yes Has Compression in Place as Prescribed: Yes Pain Present Now: No Electronic Signature(s) Signed: 01/26/2020 6:04:59 PM By: Carlene Coria RN Entered By: Carlene Coria on 01/24/2020 14:26:09 -------------------------------------------------------------------------------- Encounter Discharge Information Details Patient Name: Date of Service: FA [place], Jessica Dunlap. 01/24/2020 1:45 PM Medical Record Number: [id number] Patient Account Number: [id number] Date of Birth/Sex: Treating RN: 11-15-1955 (65 y.o. Elam Dutch Primary Care Danniel Tones: [employer] Other Clinician: Referring Raechell Singleton: Treating Elisavet Buehrer/Extender: Garfield Cornea in Treatment: 3 Encounter Discharge Information Items Post Procedure Vitals Discharge Condition: Stable Temperature (F): 99.3 Ambulatory Status: Cane Pulse (bpm): 86 Discharge Destination: Home Respiratory Rate (breaths/min): 18 Transportation: Private Auto Blood Pressure (mmHg): 124/82 Accompanied By: sister Schedule Follow-up Appointment: Yes Clinical Summary of Care: Patient Declined Electronic Signature(s) Signed: 01/24/2020 6:12:56 PM By: Baruch Gouty RN, BSN Entered By: Baruch Gouty on 01/24/2020 15:26:18 -------------------------------------------------------------------------------- Lower Extremity Assessment Details Patient Name: Date of Service: FA IN, Jessica L. 01/24/2020 1:45 PM Medical Record Number: [id number] Patient Account Number: [id number] Date of Birth/Sex: Treating RN: August 03, 1955 (64 y.o. Orvan Falconer Primary Care Riyana Biel: [employer] Other Clinician: Referring Anzal Bartnick: Treating Sherolyn Trettin/Extender: Garfield Cornea in Treatment: 3 Edema Assessment Assessed: [Left: No] [Right: No] Edema: [Left: No] [Right: No] Calf Left: Right: Point of Measurement: cm From Medial Instep 28 cm 28 cm Ankle Left: Right: Point of Measurement: cm From Medial Instep 19 cm 20 cm Electronic Signature(s) Signed: 01/26/2020 6:04:59 PM By: Carlene Coria RN Entered By: Carlene Coria on 01/24/2020 14:35:54 -------------------------------------------------------------------------------- Multi Wound Chart Details Patient Name: Date of Service: Jessica Dunlap, Jessica Dunlap. 01/24/2020 1:45 PM Medical Record Number: [id number] Patient Account Number: [id number] Date of Birth/Sex: Treating RN: 16-Jun-1956 (64 y.o. Nancy Fetter Primary Care Georgean Spainhower: [employer] Other Clinician: Referring Magdeline Prange: Treating Areeb Corron/Extender: Garfield Cornea in Treatment: 3 Vital Signs Height(in): 62 Pulse(bpm):  16 Weight(lbs): 131 Blood Pressure(mmHg): 124/72 Body Mass Index(BMI): 23 Temperature(F): 99.3 Respiratory Rate(breaths/min): 18 Photos: [1:No Photos Left, Lateral Foot] [2:No Photos Right T Great oe] [N/A:N/A N/A] Wound Location: [1:Gradually Appeared] [2:Gradually Appeared] [N/A:N/A] Wounding Event: [1:Diabetic Wound/Ulcer of the Lower] [2:Diabetic Wound/Ulcer of the Lower] [N/A:N/A] Primary Etiology: [1:Extremity Cataracts, Hypertension, Cirrhosis , Cataracts, Hypertension, Cirrhosis , N/A] [2:Extremity] Comorbid History: [1:Type II Diabetes, Rheumatoid Arthritis, Type II Diabetes, Rheumatoid Arthritis, Neuropathy 10/30/2019] [2:Neuropathy 10/11/2019] [N/A:N/A] Date Acquired: [1:3] [2:3] [N/A:N/A] Weeks of Treatment: [1:Open] [2:Open] [N/A:N/A]  Wound Status: [1:1x1.1x0.1] [2:0.2x0.3x0.1] [N/A:N/A] Measurements L x W x D (cm) [1:0.864] [2:0.047] [N/A:N/A] A (cm) : rea [1:0.086] [2:0.005] [N/A:N/A] Volume (cm) : [1:-25.00%] [2:0.00%] [N/A:N/A] % Reduction in Area: [1:-24.60%] [2:44.40%] [N/A:N/A] % Reduction in Volume: [2:12] Starting Position 1 (o'clock): [2:12] Ending Position 1 (o'clock): [2:0.7] Maximum Distance 1 (cm): [1:No] [2:Yes] [N/A:N/A] Undermining: [1:Grade 1] [2:Grade 1] [N/A:N/A] Classification: [1:Medium] [2:Medium] [N/A:N/A] Exudate A mount: [1:Serosanguineous] [2:Serosanguineous] [N/A:N/A] Exudate Type: [1:red, brown] [2:red, brown] [N/A:N/A] Exudate Color: [1:Medium (34-66%)] [2:Large (67-100%)] [N/A:N/A] Granulation A mount: [1:Red] [2:Red] [N/A:N/A] Granulation Quality: [1:Medium (34-66%)] [2:None Present (0%)] [N/A:N/A] Necrotic A mount: [1:Fat Layer (Subcutaneous Tissue)] [2:Fat Layer (Subcutaneous Tissue)] [N/A:N/A] Exposed Structures: [1:Exposed: Yes Fascia: No Tendon: No Muscle: No Joint: No Bone: No Debridement - Excisional] [2:Exposed: Yes Fascia: No Tendon: No Muscle: No Joint: No Bone: No Debridement - Selective/Open Wound]  [N/A:N/A] Debridement: Pre-procedure Verification/Time Out 15:03 [2:15:03] [N/A:N/A] Taken: [1:Callus, Subcutaneous] [2:Callus] [N/A:N/A] Tissue Debrided: [1:Skin/Subcutaneous Tissue] [2:Skin/Epidermis] [N/A:N/A] Level: [1:1.1] [2:0.06] [N/A:N/A] Debridement A (sq cm): [1:rea Blade, Forceps] [2:Curette] [N/A:N/A] Instrument: [1:Moderate] [2:Moderate] [N/A:N/A] Bleeding: [1:Silver Nitrate] [2:Pressure] [N/A:N/A] Hemostasis A chieved: [1:0] [2:0] [N/A:N/A] Procedural Pain: [1:0] [2:0] [N/A:N/A] Post Procedural Pain: [1:Procedure was tolerated well] [2:Procedure was tolerated well] [N/A:N/A] Debridement Treatment Response: [1:1x1.1x0.1] [2:0.2x0.3x0.1] [N/A:N/A] Post Debridement Measurements L x W x D (cm) [1:0.086] [2:0.005] [N/A:N/A] Post Debridement Volume: (cm) [1:Debridement] [2:Debridement] [N/A:N/A] Treatment Notes Wound #1 (Left, Lateral Foot) 3. Primary Dressing Applied Polymem 4. Secondary Dressing Dry Gauze Roll Gauze Foam Wound #2 (Right Toe Great) 3. Primary Dressing Applied Polymem 4. Secondary Dressing Roll Gauze Electronic Signature(s) Signed: 01/24/2020 6:00:25 PM By: Linton Ham MD Signed: 01/26/2020 6:13:34 PM By: Levan Hurst RN, BSN Entered By: Linton Ham on 01/24/2020 15:42:28 -------------------------------------------------------------------------------- Multi-Disciplinary Care Plan Details Patient Name: Date of Service: FA IN, Karlei L. 01/24/2020 1:45 PM Medical Record Number: [id number] Patient Account Number: [id number] Date of Birth/Sex: Treating RN: 1955/11/20 (64 y.o. Nancy Fetter Primary Care Man Effertz: [employer] Other Clinician: Referring Matin Mattioli: Treating Kylin Genna/Extender: Garfield Cornea in Treatment: 3 Active Inactive Nutrition Nursing Diagnoses: Potential for alteratiion in Nutrition/Potential for imbalanced nutrition Goals: Patient/caregiver verbalizes understanding of need to maintain  therapeutic glucose control per primary care physician Date Initiated: 12/31/2019 Target Resolution Date: 02/04/2020 Goal Status: Active Interventions: Provide education on elevated blood sugars and impact on wound healing Notes: Pain, Acute or Chronic Nursing Diagnoses: Pain, acute or chronic: actual or potential Goals: Patient/caregiver will verbalize adequate pain control between visits Date Initiated: 12/31/2019 Target Resolution Date: 02/04/2020 Goal Status: Active Interventions: Provide education on pain management Notes: Wound/Skin Impairment Nursing Diagnoses: Impaired tissue integrity Goals: Ulcer/skin breakdown will have a volume reduction of 30% by week 4 Date Initiated: 12/31/2019 Target Resolution Date: 02/04/2020 Goal Status: Active Interventions: Provide education on ulcer and skin care Notes: Electronic Signature(s) Signed: 01/26/2020 6:13:34 PM By: Levan Hurst RN, BSN Entered By: Levan Hurst on 01/24/2020 15:14:36 -------------------------------------------------------------------------------- Pain Assessment Details Patient Name: Date of Service: FA IN, Jessica Dunlap. 01/24/2020 1:45 PM Medical Record Number: [id number] Patient Account Number: [id number] Date of Birth/Sex: Treating RN: June 19, 1956 (64 y.o. Orvan Falconer Primary Care Rossana Molchan: [employer] Other Clinician: Referring Sajjad Honea: Treating Niv Darley/Extender: Garfield Cornea in Treatment: 3 Active Problems Location of Pain Severity and Description of Pain Patient Has Paino No Site Locations Pain Management and Medication Current Pain Management: Electronic Signature(s) Signed: 01/26/2020 6:04:59 PM By: Carlene Coria RN Entered By: Carlene Coria on 01/24/2020 14:26:39 -------------------------------------------------------------------------------- Patient/Caregiver Education Details Patient Name:  Date of Service: MEMORY, [facility]INRIC[facility] 7/26/2021andnbsp1:45 PM Medical Record Number:  [id number] Patient Account Number: [id number] Date of Birth/Gender: Treating RN: April 18, 1956 (64 y.o. Nancy Fetter Primary Care Physician: [employer] Other Clinician: Referring Physician: Treating Physician/Extender: Garfield Cornea in Treatment: 3 Education Assessment Education Provided To: Patient Education Topics Provided Wound/Skin Impairment: Methods: Explain/Verbal Responses: State content correctly Electronic Signature(s) Signed: 01/26/2020 6:13:34 PM By: Levan Hurst RN, BSN Entered By: Levan Hurst on 01/24/2020 15:14:47 -------------------------------------------------------------------------------- Wound Assessment Details Patient Name: Date of Service: FA IN, Jessica Dunlap. 01/24/2020 1:45 PM Medical Record Number: [id number] Patient Account Number: [id number] Date of Birth/Sex: Treating RN: 1956-02-29 (64 y.o. Orvan Falconer Primary Care Jacques Fife: [employer] Other Clinician: Referring Hillarie Harrigan: Treating Eden Rho/Extender: Garfield Cornea in Treatment: 3 Wound Status Wound Number: 1 Primary Diabetic Wound/Ulcer of the Lower Extremity Etiology: Wound Location: Left, Lateral Foot Wound Open Wounding Event: Gradually Appeared Status: Date Acquired: 10/30/2019 Comorbid Cataracts, Hypertension, Cirrhosis , Type II Diabetes, Weeks Of Treatment: 3 History: Rheumatoid Arthritis, Neuropathy Clustered Wound: No Photos Photo Uploaded By: Mikeal Hawthorne on 01/26/2020 13:26:33 Wound Measurements Length: (cm) 1 Width: (cm) 1.1 Depth: (cm) 0.1 Area: (cm) 0.864 Volume: (cm) 0.086 % Reduction in Area: -25% % Reduction in Volume: -24.6% Tunneling: No Undermining: No Wound Description Classification: Grade 1 Exudate Amount: Medium Exudate Type: Serosanguineous Exudate Color: red, brown Foul Odor After Cleansing: No Slough/Fibrino Yes Wound Bed Granulation Amount: Medium (34-66%) Exposed Structure Granulation  Quality: Red Fascia Exposed: No Necrotic Amount: Medium (34-66%) Fat Layer (Subcutaneous Tissue) Exposed: Yes Necrotic Quality: Adherent Slough Tendon Exposed: No Muscle Exposed: No Joint Exposed: No Bone Exposed: No Treatment Notes Wound #1 (Left, Lateral Foot) 3. Primary Dressing Applied Polymem 4. Secondary Dressing Dry Gauze Roll Gauze Foam Electronic Signature(s) Signed: 01/26/2020 6:04:59 PM By: Carlene Coria RN Entered By: Carlene Coria on 01/24/2020 14:36:56 -------------------------------------------------------------------------------- Wound Assessment Details Patient Name: Date of Service: FA IN, KAFI DOTTER 01/24/2020 1:45 PM Medical Record Number: [id number] Patient Account Number: [id number] Date of Birth/Sex: Treating RN: October 20, 1955 (64 y.o. Orvan Falconer Primary Care Viva Gallaher: [employer] Other Clinician: Referring Revere Maahs: Treating Cherolyn Behrle/Extender: Garfield Cornea in Treatment: 3 Wound Status Wound Number: 2 Primary Diabetic Wound/Ulcer of the Lower Extremity Etiology: Wound Location: Right T Great oe Wound Open Wounding Event: Gradually Appeared Status: Date Acquired: 10/11/2019 Comorbid Cataracts, Hypertension, Cirrhosis , Type II Diabetes, Weeks Of Treatment: 3 History: Rheumatoid Arthritis, Neuropathy Clustered Wound: No Photos Photo Uploaded By: Mikeal Hawthorne on 01/26/2020 13:26:34 Wound Measurements Length: (cm) 0.2 % Re Width: (cm) 0.3 % Re Depth: (cm) 0.1 Tunn Area: (cm) 0.047 Und Volume: (cm) 0.005 E M duction in Area: 0% duction in Volume: 44.4% eling: No ermining: Yes Starting Position (o'clock): 12 nding Position (o'clock): 12 aximum Distance: (cm) 0.7 Wound Description Classification: Grade 1 Foul Exudate Amount: Medium Slou Exudate Type: Serosanguineous Exudate Color: red, brown Odor After Cleansing: No gh/Fibrino No Wound Bed Granulation Amount: Large (67-100%) Exposed Structure Granulation  Quality: Red Fascia Exposed: No Necrotic Amount: None Present (0%) Fat Layer (Subcutaneous Tissue) Exposed: Yes Tendon Exposed: No Muscle Exposed: No Joint Exposed: No Bone Exposed: No Treatment Notes Wound #2 (Right Toe Great) 3. Primary Dressing Applied Polymem 4. Secondary Dressing Roll Gauze Electronic Signature(s) Signed: 01/26/2020 6:04:59 PM By: Carlene Coria RN Entered By: Carlene Coria on 01/24/2020 14:37:33 -------------------------------------------------------------------------------- [place] Details Patient Name: Date of Service: FA IN, Leanza L. 01/24/2020 1:45 PM Medical Record Number: [id number] Patient Account  Number: [id number] Date of Birth/Sex: Treating RN: [birth year] (64 y.o. Orvan Falconer Primary Care Everlyn Farabaugh: [employer] Other Clinician: Referring Tesla Bochicchio: Treating Jamee Pacholski/Extender: Garfield Cornea in Treatment: 3 Vital Signs Time Taken: 14:26 Temperature (F): 99.3 Height (in): 63 Pulse (bpm): 86 Weight (lbs): 131 Respiratory Rate (breaths/min): 18 Body Mass Index (BMI): 23.2 Blood Pressure (mmHg): 124/72 Reference Range: 80 - 120 mg / dl Electronic Signature(s) Signed: 01/26/2020 6:04:59 PM By: Carlene Coria RN Entered By: Carlene Coria on 01/24/2020 14:26:29

## 2020-02-07 ENCOUNTER — Encounter (HOSPITAL_BASED_OUTPATIENT_CLINIC_OR_DEPARTMENT_OTHER): Payer: 59 | Admitting: Internal Medicine

## 2020-02-15 ENCOUNTER — Encounter (HOSPITAL_BASED_OUTPATIENT_CLINIC_OR_DEPARTMENT_OTHER): Payer: 59 | Attending: Internal Medicine | Admitting: Internal Medicine

## 2020-02-15 ENCOUNTER — Other Ambulatory Visit: Payer: Self-pay

## 2020-02-15 DIAGNOSIS — E1140 Type 2 diabetes mellitus with diabetic neuropathy, unspecified: Secondary | ICD-10-CM | POA: Insufficient documentation

## 2020-02-15 DIAGNOSIS — L97511 Non-pressure chronic ulcer of other part of right foot limited to breakdown of skin: Secondary | ICD-10-CM | POA: Insufficient documentation

## 2020-02-15 DIAGNOSIS — L03116 Cellulitis of left lower limb: Secondary | ICD-10-CM | POA: Insufficient documentation

## 2020-02-15 DIAGNOSIS — E11621 Type 2 diabetes mellitus with foot ulcer: Secondary | ICD-10-CM | POA: Diagnosis present

## 2020-02-15 DIAGNOSIS — Z87891 Personal history of nicotine dependence: Secondary | ICD-10-CM | POA: Insufficient documentation

## 2020-02-15 DIAGNOSIS — L97522 Non-pressure chronic ulcer of other part of left foot with fat layer exposed: Secondary | ICD-10-CM | POA: Diagnosis not present

## 2020-02-15 DIAGNOSIS — E1142 Type 2 diabetes mellitus with diabetic polyneuropathy: Secondary | ICD-10-CM | POA: Insufficient documentation

## 2020-02-15 DIAGNOSIS — I10 Essential (primary) hypertension: Secondary | ICD-10-CM | POA: Diagnosis not present

## 2020-02-15 DIAGNOSIS — M21172 Varus deformity, not elsewhere classified, left ankle: Secondary | ICD-10-CM | POA: Diagnosis not present

## 2020-02-18 ENCOUNTER — Encounter (HOSPITAL_BASED_OUTPATIENT_CLINIC_OR_DEPARTMENT_OTHER): Payer: 59 | Admitting: Internal Medicine

## 2020-02-18 ENCOUNTER — Other Ambulatory Visit (HOSPITAL_COMMUNITY)
Admission: RE | Admit: 2020-02-18 | Discharge: 2020-02-18 | Disposition: A | Discharge status: Home / Self Care | Payer: 59 | Source: Other Acute Inpatient Hospital | Attending: Internal Medicine | Admitting: Internal Medicine

## 2020-02-18 DIAGNOSIS — E11621 Type 2 diabetes mellitus with foot ulcer: Secondary | ICD-10-CM | POA: Insufficient documentation

## 2020-02-21 NOTE — Progress Notes (Signed)
BLESSINGS, INGLETT ([id number]) Visit Report for 02/18/2020 HPI Details Patient Name: Date of Service: Jessica, Jessica Dunlap 02/18/2020 10:45 A M Medical Record Number: [id number] Patient Account Number: [id number] Date of Birth/Sex: Treating RN: 1955-09-24 (64 y.o. Clearnce Sorrel Primary Care Provider: [employer] Other Clinician: Referring Provider: Treating Provider/Extender: Garfield Cornea in Treatment: 7 History of Present Illness HPI Description: ADMISSION 12/31/2019 This is a 64 year old woman with type 2 diabetes. She has been dealing with an area on her tip of her right great toe and the outside of her left foot since mid April. She has been following with Dr. Marcheta Grammes podiatry in [place]. She has recently moved to [place]. She has been using Silvadene cream to the wound. She has a AFO brace brace to try and straighten her left ankle but the patient states it hurts and she sometimes does not use it. She tries to be active including going to the store etc. etc. She also has significant neuropathy in both feet. Past medical history includes Karlene Lineman, cataracts, type 2 diabetes with neuropathy, history of Graves' disease now hypothyroid, autoimmune hemolytic anemia, quit smoking 10 years ago, hypertension Arterial studies; done this year she had ABI in the right of 0.94 TBI of 0.93. On the left 0.95 and a TBI of 0.67 7/9; this patient has an area in the tip of her right great toe and outside of her left foot at roughly the base of the fifth metatarsal. We applied PolyMem to both of these wounds last week. The toe is improved however the area on the left lateral foot is not. She tells me she does not wear the brace when she is in her home but she is trying to stay off the wound is much as possible by not being on her foot 7/26; the patient arrives with thick callus around the wound on the left lateral foot and thick tissue around the area on her right great toe. Both of  these require debridement. We have been using polymen Ag 8/17; no real improvement in either wound area which is on the base of the fifth metatarsal laterally on the left and on the plantar right first toe tip. She walks with her left foot inverted at the ankle. She has an AFO brace but I do not think this is working all that well. She is concerned about areas of erythema on the left lateral calf there are actually 3 of them. She does not feel pain but notes that she is insensate with her neuropathy from her diabetes. We have been using polymen Ag but making no progress 8/20; patient was brought in early urgently today after we were called to report that the patient had erythema and an odor on the wound on the left foot. Electronic Signature(s) Signed: 02/21/2020 7:15:26 AM By: Linton Ham MD Entered By: Linton Ham on 02/18/2020 13:04:34 -------------------------------------------------------------------------------- Physical Exam Details Patient Name: Date of Service: FA IN, Jessica Dunlap. 02/18/2020 10:45 A M Medical Record Number: [id number] Patient Account Number: [id number] Date of Birth/Sex: Treating RN: 07-11-1955 (64 y.o. Clearnce Sorrel Primary Care Provider: [employer] Other Clinician: Referring Provider: Treating Provider/Extender: Garfield Cornea in Treatment: 7 Constitutional Patient is hypertensive.. Pulse regular and within target range for patient.Marland Kitchen Respirations regular, non-labored and within target range.. Temperature is normal and within the target range for the patient.Marland Kitchen Appears in no distress. Cardiovascular Pedal pulses are palpable. Notes Wound examon the left lateral foot the wound does  not look too bad however she has an abscess in the midfoot and some erythema. This was evacuated. Culture was obtained. Electronic Signature(s) Signed: 02/21/2020 7:15:26 AM By: Linton Ham MD Entered By: Linton Ham on 02/18/2020  13:06:52 -------------------------------------------------------------------------------- Physician Orders Details Patient Name: Date of Service: FA IN, Jessica Dunlap. 02/18/2020 10:45 A M Medical Record Number: [id number] Patient Account Number: [id number] Date of Birth/Sex: Treating RN: [birth year] (64 y.o. Clearnce Sorrel Primary Care Provider: [employer] Other Clinician: Referring Provider: Treating Provider/Extender: Garfield Cornea in Treatment: 7 Verbal / Phone Orders: No Diagnosis Coding Follow-up Appointments Return Appointment in 1 week. Dressing Change Frequency Wound #1 Left,Lateral Foot Change dressing every day. Wound #2 Right T Great oe Change Dressing every other day. Wound Cleansing May shower and wash wound with soap and water. - when dressing is changed Primary Wound Dressing Wound #1 Left,Lateral Foot Calcium Alginate with Silver Wound #2 Right T Great oe Silver Collagen - moisten with hydrogel or KY jelly Secondary Dressing Wound #1 Left,Lateral Foot Foam - foam donut Kerlix/Rolled Gauze Dry Gauze Wound #2 Right T Great oe Kerlix/Rolled Gauze Dry Gauze Laboratory naerobe culture (MICRO) - Wound to Left lateral foot Bacteria identified in Unspecified specimen by A LOINC Code: 740-8 Convenience Name: Anerobic culture Patient Medications llergies: No Known Allergies A Notifications Medication Indication Start End 02/18/2020 doxycycline monohydrate DOSE oral 100 mg capsule - 1 capsule oral bid for 7 days Electronic Signature(s) Signed: 02/18/2020 1:09:39 PM By: Linton Ham MD Entered By: Linton Ham on 02/18/2020 13:09:38 -------------------------------------------------------------------------------- Problem List Details Patient Name: Date of Service: Jessica Dunlap, Jessica Dunlap. 02/18/2020 10:45 A M Medical Record Number: [id number] Patient Account Number: [id number] Date of Birth/Sex: Treating RN: [birth year] (64 y.o. Clearnce Sorrel Primary Care Provider: [employer] Other Clinician: Referring Provider: Treating Provider/Extender: Garfield Cornea in Treatment: 7 Active Problems ICD-10 Encounter Code Description Active Date MDM Diagnosis E11.621 Type 2 diabetes mellitus with foot ulcer 12/31/2019 No Yes L97.522 Non-pressure chronic ulcer of other part of left foot with fat layer exposed 12/31/2019 No Yes L97.511 Non-pressure chronic ulcer of other part of right foot limited to breakdown of 12/31/2019 No Yes skin E11.42 Type 2 diabetes mellitus with diabetic polyneuropathy 12/31/2019 No Yes M21.172 Varus deformity, not elsewhere classified, left ankle 12/31/2019 No Yes L03.116 Cellulitis of left lower limb 02/18/2020 No Yes Inactive Problems Resolved Problems Electronic Signature(s) Signed: 02/21/2020 7:15:26 AM By: Linton Ham MD Entered By: Linton Ham on 02/18/2020 13:03:46 -------------------------------------------------------------------------------- Progress Note Details Patient Name: Date of Service: Jessica Dunlap, Jessica Dunlap. 02/18/2020 10:45 A M Medical Record Number: [id number] Patient Account Number: [id number] Date of Birth/Sex: Treating RN: 06/04/1956 (64 y.o. Clearnce Sorrel Primary Care Provider: [employer] Other Clinician: Referring Provider: Treating Provider/Extender: Garfield Cornea in Treatment: 7 Subjective History of Present Illness (HPI) ADMISSION 12/31/2019 This is a 64 year old woman with type 2 diabetes. She has been dealing with an area on her tip of her right great toe and the outside of her left foot since mid April. She has been following with Dr. Marcheta Grammes podiatry in [place]. She has recently moved to [place]. She has been using Silvadene cream to the wound. She has a AFO brace brace to try and straighten her left ankle but the patient states it hurts and she sometimes does not use it. She tries to be  active including going to the store etc. etc. She also has significant neuropathy in both feet. Past  medical history includes Karlene Lineman, cataracts, type 2 diabetes with neuropathy, history of Graves' disease now hypothyroid, autoimmune hemolytic anemia, quit smoking 10 years ago, hypertension Arterial studies; done this year she had ABI in the right of 0.94 TBI of 0.93. On the left 0.95 and a TBI of 0.67 7/9; this patient has an area in the tip of her right great toe and outside of her left foot at roughly the base of the fifth metatarsal. We applied PolyMem to both of these wounds last week. The toe is improved however the area on the left lateral foot is not. She tells me she does not wear the brace when she is in her home but she is trying to stay off the wound is much as possible by not being on her foot 7/26; the patient arrives with thick callus around the wound on the left lateral foot and thick tissue around the area on her right great toe. Both of these require debridement. We have been using polymen Ag 8/17; no real improvement in either wound area which is on the base of the fifth metatarsal laterally on the left and on the plantar right first toe tip. She walks with her left foot inverted at the ankle. She has an AFO brace but I do not think this is working all that well. She is concerned about areas of erythema on the left lateral calf there are actually 3 of them. She does not feel pain but notes that she is insensate with her neuropathy from her diabetes. We have been using polymen Ag but making no progress 8/20; patient was brought in early urgently today after we were called to report that the patient had erythema and an odor on the wound on the left foot. Objective Constitutional Patient is hypertensive.. Pulse regular and within target range for patient.Marland Kitchen Respirations regular, non-labored and within target range.. Temperature is normal and within the target range for the patient.Marland Kitchen  Appears in no distress. Vitals Time Taken: 11:47 AM, Height: 63 in, Weight: 131 lbs, BMI: 23.2, Temperature: 98.2 F, Pulse: 97 bpm, Respiratory Rate: 18 breaths/min, Blood Pressure: 147/71 mmHg. Cardiovascular Pedal pulses are palpable. General Notes: Wound examooon the left lateral foot the wound does not look too bad however she has an abscess in the midfoot and some erythema. This was evacuated. Culture was obtained. Integumentary (Hair, Skin) Wound #1 status is Open. Original cause of wound was Gradually Appeared. The wound is located on the Left,Lateral Foot. The wound measures 1.2cm length x 5.4cm width x 0.5cm depth; 5.089cm^2 area and 2.545cm^3 volume. There is Fat Layer (Subcutaneous Tissue) Exposed exposed. There is no tunneling or undermining noted. There is a medium amount of serosanguineous drainage noted. The wound margin is well defined and not attached to the wound base. There is medium (34-66%) red granulation within the wound bed. There is a medium (34-66%) amount of necrotic tissue within the wound bed including Adherent Slough. Wound #2 status is Open. Original cause of wound was Gradually Appeared. The wound is located on the Right T Great. The wound measures 0.3cm length x oe 0.2cm width x 0.2cm depth; 0.047cm^2 area and 0.009cm^3 volume. There is Fat Layer (Subcutaneous Tissue) Exposed exposed. There is no tunneling or undermining noted. There is a medium amount of serosanguineous drainage noted. The wound margin is distinct with the outline attached to the wound base. There is large (67-100%) red granulation within the wound bed. There is no necrotic tissue within the wound bed. 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 Dressing Change Frequency: Wound #1 Left,Lateral Foot: Change dressing every day. Wound #2 Right T Great: oe Change Dressing every other day. Wound Cleansing: May shower and wash wound with soap and water. - when dressing is changed Primary Wound Dressing: Wound #1 Left,Lateral Foot: Calcium Alginate with Silver Wound #2 Right T Great: oe Silver Collagen - moisten with hydrogel or KY jelly Secondary Dressing: Wound #1 Left,Lateral Foot: Foam - foam donut Kerlix/Rolled Gauze Dry Gauze Wound #2 Right T Great: oe Kerlix/Rolled Gauze Dry Gauze Laboratory ordered were: Anerobic culture - Wound to Left lateral foot The following medication(s) was prescribed: doxycycline monohydrate oral 100 mg capsule 1 capsule oral bid for 7 days starting 02/18/2020 1. I am glad they called. 2. The fluid on the dorsal foot was purulent and was evacuated. This connected with the wound on the left lateral foot. Specimen obtained for culture 3. Doxycycline 100 twice daily for 7 days empirically. 4. Silver alginate to the wound bed Electronic Signature(s) Signed: 02/18/2020 1:10:01 PM By: Linton Ham MD Entered By: Linton Ham on 02/18/2020 13:10:00 -------------------------------------------------------------------------------- SuperBill Details Patient Name: Date of Service: Jessica Dunlap, DETRA BORES 02/18/2020 Medical Record Number: [id number] Patient Account Number: [id number] Date of Birth/Sex: Treating RN: 28-Dec-1955 (64 y.o. Clearnce Sorrel Primary Care Provider: [employer] Other Clinician: Referring Provider: Treating Provider/Extender: Garfield Cornea in Treatment: 7 Diagnosis Coding ICD-10 Codes Code Description [phone] Type 2 diabetes mellitus with foot ulcer L97.522 Non-pressure chronic ulcer of  other part of left foot with fat layer exposed L97.511 Non-pressure chronic ulcer of other part of right foot limited to breakdown of skin E11.42 Type 2 diabetes mellitus with diabetic polyneuropathy M21.172 Varus deformity, not elsewhere classified, left ankle L03.116 Cellulitis of left lower limb Facility Procedures CPT4 Code: [id number] Description: [place] VISIT-LEV 3 EST PT Modifier: Quantity: 1 Physician Procedures Electronic Signature(s) Signed: 02/21/2020 7:15:26 AM By: Linton Ham MD Entered By: Linton Ham on 02/18/2020 13:10:33

## 2020-02-22 ENCOUNTER — Encounter (HOSPITAL_BASED_OUTPATIENT_CLINIC_OR_DEPARTMENT_OTHER): Payer: 59 | Admitting: Internal Medicine

## 2020-02-22 DIAGNOSIS — E11621 Type 2 diabetes mellitus with foot ulcer: Secondary | ICD-10-CM | POA: Diagnosis not present

## 2020-02-23 LAB — AEROBIC CULTURE W GRAM STAIN (SUPERFICIAL SPECIMEN)

## 2020-02-24 ENCOUNTER — Ambulatory Visit: Payer: Self-pay | Attending: Internal Medicine

## 2020-02-24 DIAGNOSIS — Z23 Encounter for immunization: Secondary | ICD-10-CM

## 2020-02-24 NOTE — Progress Notes (Signed)
   Covid-19 Vaccination Clinic  Name:  Jessica Dunlap    MRN: [id number] DOB: [birth year]  02/24/2020  Jessica Dunlap was observed post Covid-19 immunization for 15 minutes without incident. She was provided with Vaccine Information Sheet and instruction to access the V-Safe system.   Jessica Dunlap was instructed to call 911 with any severe reactions post vaccine: Marland Kitchen Difficulty breathing  . Swelling of face and throat  . A fast heartbeat  . A bad rash all over body  . Dizziness and weakness   Immunizations Administered    Name Date Dose VIS Date Route   Pfizer COVID-19 Vaccine 02/24/2020  2:23 PM 0.3 mL 08/25/2018 Intramuscular   Manufacturer: [employer]   Lot: [id number]   [place]: [id number]

## 2020-02-26 ENCOUNTER — Inpatient Hospital Stay (HOSPITAL_COMMUNITY)
Admission: EM | Admit: 2020-02-26 | Discharge: 2020-03-13 | DRG: 240 | Disposition: A | Discharge status: Skilled Nursing Facility | Payer: 59 | Attending: Family Medicine | Admitting: Family Medicine

## 2020-02-26 ENCOUNTER — Encounter (HOSPITAL_COMMUNITY): Payer: Self-pay | Admitting: Emergency Medicine

## 2020-02-26 ENCOUNTER — Other Ambulatory Visit: Payer: Self-pay

## 2020-02-26 ENCOUNTER — Emergency Department (HOSPITAL_COMMUNITY): Payer: 59

## 2020-02-26 DIAGNOSIS — G894 Chronic pain syndrome: Secondary | ICD-10-CM | POA: Diagnosis present

## 2020-02-26 DIAGNOSIS — L97509 Non-pressure chronic ulcer of other part of unspecified foot with unspecified severity: Secondary | ICD-10-CM | POA: Diagnosis present

## 2020-02-26 DIAGNOSIS — G629 Polyneuropathy, unspecified: Secondary | ICD-10-CM | POA: Insufficient documentation

## 2020-02-26 DIAGNOSIS — L97408 Non-pressure chronic ulcer of unspecified heel and midfoot with other specified severity: Secondary | ICD-10-CM | POA: Diagnosis not present

## 2020-02-26 DIAGNOSIS — Z87891 Personal history of nicotine dependence: Secondary | ICD-10-CM | POA: Diagnosis not present

## 2020-02-26 DIAGNOSIS — Z7983 Long term (current) use of bisphosphonates: Secondary | ICD-10-CM | POA: Diagnosis not present

## 2020-02-26 DIAGNOSIS — L97929 Non-pressure chronic ulcer of unspecified part of left lower leg with unspecified severity: Secondary | ICD-10-CM

## 2020-02-26 DIAGNOSIS — L03116 Cellulitis of left lower limb: Secondary | ICD-10-CM

## 2020-02-26 DIAGNOSIS — F32A Depression, unspecified: Secondary | ICD-10-CM | POA: Insufficient documentation

## 2020-02-26 DIAGNOSIS — E11621 Type 2 diabetes mellitus with foot ulcer: Secondary | ICD-10-CM | POA: Diagnosis present

## 2020-02-26 DIAGNOSIS — L97529 Non-pressure chronic ulcer of other part of left foot with unspecified severity: Secondary | ICD-10-CM | POA: Diagnosis present

## 2020-02-26 DIAGNOSIS — Z20822 Contact with and (suspected) exposure to covid-19: Secondary | ICD-10-CM | POA: Diagnosis present

## 2020-02-26 DIAGNOSIS — Z79890 Hormone replacement therapy (postmenopausal): Secondary | ICD-10-CM | POA: Diagnosis not present

## 2020-02-26 DIAGNOSIS — Z8541 Personal history of malignant neoplasm of cervix uteri: Secondary | ICD-10-CM

## 2020-02-26 DIAGNOSIS — M14672 Charcot's joint, left ankle and foot: Secondary | ICD-10-CM | POA: Diagnosis not present

## 2020-02-26 DIAGNOSIS — K766 Portal hypertension: Secondary | ICD-10-CM | POA: Diagnosis present

## 2020-02-26 DIAGNOSIS — K7460 Unspecified cirrhosis of liver: Secondary | ICD-10-CM | POA: Diagnosis present

## 2020-02-26 DIAGNOSIS — I509 Heart failure, unspecified: Secondary | ICD-10-CM | POA: Diagnosis present

## 2020-02-26 DIAGNOSIS — Z79891 Long term (current) use of opiate analgesic: Secondary | ICD-10-CM

## 2020-02-26 DIAGNOSIS — E1110 Type 2 diabetes mellitus with ketoacidosis without coma: Secondary | ICD-10-CM | POA: Diagnosis present

## 2020-02-26 DIAGNOSIS — E039 Hypothyroidism, unspecified: Secondary | ICD-10-CM | POA: Diagnosis present

## 2020-02-26 DIAGNOSIS — IMO0002 Reserved for concepts with insufficient information to code with codable children: Secondary | ICD-10-CM | POA: Diagnosis present

## 2020-02-26 DIAGNOSIS — Z7984 Long term (current) use of oral hypoglycemic drugs: Secondary | ICD-10-CM

## 2020-02-26 DIAGNOSIS — E1152 Type 2 diabetes mellitus with diabetic peripheral angiopathy with gangrene: Secondary | ICD-10-CM | POA: Diagnosis not present

## 2020-02-26 DIAGNOSIS — E1165 Type 2 diabetes mellitus with hyperglycemia: Secondary | ICD-10-CM

## 2020-02-26 DIAGNOSIS — L97429 Non-pressure chronic ulcer of left heel and midfoot with unspecified severity: Secondary | ICD-10-CM

## 2020-02-26 DIAGNOSIS — I96 Gangrene, not elsewhere classified: Secondary | ICD-10-CM | POA: Diagnosis not present

## 2020-02-26 DIAGNOSIS — Z419 Encounter for procedure for purposes other than remedying health state, unspecified: Secondary | ICD-10-CM

## 2020-02-26 DIAGNOSIS — Z79899 Other long term (current) drug therapy: Secondary | ICD-10-CM | POA: Diagnosis not present

## 2020-02-26 DIAGNOSIS — C801 Malignant (primary) neoplasm, unspecified: Secondary | ICD-10-CM | POA: Insufficient documentation

## 2020-02-26 DIAGNOSIS — I2510 Atherosclerotic heart disease of native coronary artery without angina pectoris: Secondary | ICD-10-CM | POA: Diagnosis present

## 2020-02-26 DIAGNOSIS — D62 Acute posthemorrhagic anemia: Secondary | ICD-10-CM | POA: Diagnosis not present

## 2020-02-26 DIAGNOSIS — I10 Essential (primary) hypertension: Secondary | ICD-10-CM | POA: Diagnosis present

## 2020-02-26 DIAGNOSIS — E11628 Type 2 diabetes mellitus with other skin complications: Secondary | ICD-10-CM | POA: Diagnosis not present

## 2020-02-26 DIAGNOSIS — E08621 Diabetes mellitus due to underlying condition with foot ulcer: Secondary | ICD-10-CM | POA: Diagnosis present

## 2020-02-26 DIAGNOSIS — M21172 Varus deformity, not elsewhere classified, left ankle: Secondary | ICD-10-CM | POA: Diagnosis present

## 2020-02-26 DIAGNOSIS — L97909 Non-pressure chronic ulcer of unspecified part of unspecified lower leg with unspecified severity: Secondary | ICD-10-CM

## 2020-02-26 DIAGNOSIS — Z90710 Acquired absence of both cervix and uterus: Secondary | ICD-10-CM | POA: Diagnosis not present

## 2020-02-26 DIAGNOSIS — Z7982 Long term (current) use of aspirin: Secondary | ICD-10-CM

## 2020-02-26 DIAGNOSIS — Z794 Long term (current) use of insulin: Secondary | ICD-10-CM | POA: Diagnosis not present

## 2020-02-26 DIAGNOSIS — E1142 Type 2 diabetes mellitus with diabetic polyneuropathy: Secondary | ICD-10-CM | POA: Diagnosis present

## 2020-02-26 DIAGNOSIS — M86172 Other acute osteomyelitis, left ankle and foot: Secondary | ICD-10-CM | POA: Diagnosis present

## 2020-02-26 DIAGNOSIS — I110 Hypertensive heart disease with heart failure: Secondary | ICD-10-CM | POA: Diagnosis present

## 2020-02-26 DIAGNOSIS — K7581 Nonalcoholic steatohepatitis (NASH): Secondary | ICD-10-CM | POA: Diagnosis present

## 2020-02-26 DIAGNOSIS — E1169 Type 2 diabetes mellitus with other specified complication: Secondary | ICD-10-CM | POA: Diagnosis present

## 2020-02-26 DIAGNOSIS — E1140 Type 2 diabetes mellitus with diabetic neuropathy, unspecified: Secondary | ICD-10-CM | POA: Diagnosis not present

## 2020-02-26 DIAGNOSIS — L97521 Non-pressure chronic ulcer of other part of left foot limited to breakdown of skin: Secondary | ICD-10-CM | POA: Diagnosis not present

## 2020-02-26 DIAGNOSIS — Z89512 Acquired absence of left leg below knee: Secondary | ICD-10-CM | POA: Diagnosis not present

## 2020-02-26 DIAGNOSIS — L089 Local infection of the skin and subcutaneous tissue, unspecified: Secondary | ICD-10-CM | POA: Diagnosis not present

## 2020-02-26 LAB — CBC WITH DIFFERENTIAL/PLATELET
Abs Immature Granulocytes: 0.14 10*3/uL — ABNORMAL HIGH (ref 0.00–0.07)
Basophils Absolute: 0 10*3/uL (ref 0.0–0.1)
Basophils Relative: 0 %
Eosinophils Absolute: 0 10*3/uL (ref 0.0–0.5)
Eosinophils Relative: 0 %
HCT: 32.8 % — ABNORMAL LOW (ref 36.0–46.0)
Hemoglobin: 11.4 g/dL — ABNORMAL LOW (ref 12.0–15.0)
Immature Granulocytes: 1 %
Lymphocytes Relative: 3 %
Lymphs Abs: 0.5 10*3/uL — ABNORMAL LOW (ref 0.7–4.0)
MCH: 30.9 pg (ref 26.0–34.0)
MCHC: 34.8 g/dL (ref 30.0–36.0)
MCV: 88.9 fL (ref 80.0–100.0)
Monocytes Absolute: 0.9 10*3/uL (ref 0.1–1.0)
Monocytes Relative: 6 %
Neutro Abs: 14.4 10*3/uL — ABNORMAL HIGH (ref 1.7–7.7)
Neutrophils Relative %: 90 %
Platelets: 222 10*3/uL (ref 150–400)
RBC: 3.69 MIL/uL — ABNORMAL LOW (ref 3.87–5.11)
RDW: 14.1 % (ref 11.5–15.5)
WBC: 16 10*3/uL — ABNORMAL HIGH (ref 4.0–10.5)
nRBC: 0 % (ref 0.0–0.2)

## 2020-02-26 LAB — COMPREHENSIVE METABOLIC PANEL
ALT: 11 U/L (ref 0–44)
AST: 14 U/L — ABNORMAL LOW (ref 15–41)
Albumin: 3.9 g/dL (ref 3.5–5.0)
Alkaline Phosphatase: 61 U/L (ref 38–126)
Anion gap: 16 — ABNORMAL HIGH (ref 5–15)
BUN: 13 mg/dL (ref 8–23)
CO2: 21 mmol/L — ABNORMAL LOW (ref 22–32)
Calcium: 8.9 mg/dL (ref 8.9–10.3)
Chloride: 91 mmol/L — ABNORMAL LOW (ref 98–111)
Creatinine, Ser: 0.83 mg/dL (ref 0.44–1.00)
GFR calc Af Amer: >60 mL/min (ref >60)
GFR calc non Af Amer: >60 mL/min (ref >60)
Glucose, Bld: 367 mg/dL — ABNORMAL HIGH (ref 70–99)
Potassium: 3.6 mmol/L (ref 3.5–5.1)
Sodium: 128 mmol/L — ABNORMAL LOW (ref 135–145)
Total Bilirubin: 1.1 mg/dL (ref 0.3–1.2)
Total Protein: 8.3 g/dL — ABNORMAL HIGH (ref 6.5–8.1)

## 2020-02-26 LAB — CBG MONITORING, ED
Glucose-Capillary: 359 mg/dL — ABNORMAL HIGH (ref 70–99)
Glucose-Capillary: 352 mg/dL — ABNORMAL HIGH (ref 70–99)
Glucose-Capillary: 320 mg/dL — ABNORMAL HIGH (ref 70–99)
Glucose-Capillary: 167 mg/dL — ABNORMAL HIGH (ref 70–99)
Glucose-Capillary: 148 mg/dL — ABNORMAL HIGH (ref 70–99)
Glucose-Capillary: 173 mg/dL — ABNORMAL HIGH (ref 70–99)

## 2020-02-26 LAB — LACTIC ACID, PLASMA: Lactic Acid, Venous: 3.9 mmol/L (ref 0.5–1.9)

## 2020-02-26 LAB — SARS CORONAVIRUS 2 BY RT PCR (HOSPITAL ORDER, PERFORMED IN ~~LOC~~ HOSPITAL LAB): SARS Coronavirus 2: NEGATIVE

## 2020-02-26 MED ORDER — ASPIRIN EC 81 MG PO TBEC
81.0000 mg | DELAYED_RELEASE_TABLET | Freq: Every day | ORAL | Status: DC
  Administered 2020-02-27 – 2020-03-13 (×16): 81 mg via ORAL
  Filled 2020-02-26 (×16): qty 1

## 2020-02-26 MED ORDER — TRAZODONE HCL 150 MG PO TABS
150.0000 mg | ORAL_TABLET | Freq: Every day | ORAL | Status: DC
  Administered 2020-02-27 – 2020-03-12 (×16): 150 mg via ORAL
  Filled 2020-02-26 (×8): qty 1
  Filled 2020-02-26: qty 3
  Filled 2020-02-26 (×7): qty 1

## 2020-02-26 MED ORDER — SODIUM CHLORIDE 0.9 % IV BOLUS
1000.0000 mL | Freq: Once | INTRAVENOUS | Status: AC
  Administered 2020-02-26: 1000 mL via INTRAVENOUS

## 2020-02-26 MED ORDER — LACTATED RINGERS IV SOLN: INTRAVENOUS | Status: DC

## 2020-02-26 MED ORDER — DEXTROSE 50 % IV SOLN: 0.0000 mL | INTRAVENOUS | Status: DC | PRN

## 2020-02-26 MED ORDER — DULOXETINE HCL 30 MG PO CPEP
30.0000 mg | ORAL_CAPSULE | Freq: Every day | ORAL | Status: DC
  Administered 2020-02-26 – 2020-03-12 (×16): 30 mg via ORAL
  Filled 2020-02-26 (×16): qty 1

## 2020-02-26 MED ORDER — EMETROL 1.87-1.87-21.5 PO SOLN
10.0000 mL | ORAL | Status: DC | PRN
  Filled 2020-02-26: qty 118

## 2020-02-26 MED ORDER — PIPERACILLIN-TAZOBACTAM 3.375 G IVPB 30 MIN
3.3750 g | Freq: Once | INTRAVENOUS | Status: AC
  Administered 2020-02-26: 3.375 g via INTRAVENOUS
  Filled 2020-02-26: qty 50

## 2020-02-26 MED ORDER — METRONIDAZOLE IN NACL 5-0.79 MG/ML-% IV SOLN
500.0000 mg | Freq: Three times a day (TID) | INTRAVENOUS | Status: DC
  Administered 2020-02-26 – 2020-03-01 (×11): 500 mg via INTRAVENOUS
  Filled 2020-02-26 (×11): qty 100

## 2020-02-26 MED ORDER — CARBAMAZEPINE 100 MG PO CHEW
100.0000 mg | CHEWABLE_TABLET | Freq: Two times a day (BID) | ORAL | Status: DC
  Administered 2020-02-27 – 2020-03-13 (×30): 100 mg via ORAL
  Filled 2020-02-26 (×35): qty 1

## 2020-02-26 MED ORDER — POTASSIUM CHLORIDE 10 MEQ/100ML IV SOLN
10.0000 meq | INTRAVENOUS | Status: AC
  Administered 2020-02-26 (×2): 10 meq via INTRAVENOUS
  Filled 2020-02-26 (×2): qty 100

## 2020-02-26 MED ORDER — DEXTROSE IN LACTATED RINGERS 5 % IV SOLN: INTRAVENOUS | Status: DC

## 2020-02-26 MED ORDER — INSULIN REGULAR(HUMAN) IN NACL 100-0.9 UT/100ML-% IV SOLN
INTRAVENOUS | Status: DC
  Administered 2020-02-26: 12 [IU]/h via INTRAVENOUS
  Filled 2020-02-26: qty 100

## 2020-02-26 MED ORDER — SPIRONOLACTONE 25 MG PO TABS
50.0000 mg | ORAL_TABLET | Freq: Every day | ORAL | Status: DC
  Filled 2020-02-26 (×2): qty 1

## 2020-02-26 MED ORDER — DULOXETINE HCL 30 MG PO CPEP: 30.0000 mg | ORAL_CAPSULE | Freq: Every day | ORAL | Status: DC

## 2020-02-26 MED ORDER — ENOXAPARIN SODIUM 40 MG/0.4ML ~~LOC~~ SOLN
40.0000 mg | SUBCUTANEOUS | Status: DC
  Administered 2020-02-27 – 2020-02-29 (×3): 40 mg via SUBCUTANEOUS
  Filled 2020-02-26 (×3): qty 0.4

## 2020-02-26 MED ORDER — METOPROLOL TARTRATE 50 MG PO TABS
50.0000 mg | ORAL_TABLET | Freq: Two times a day (BID) | ORAL | Status: DC
  Administered 2020-02-27 – 2020-03-13 (×32): 50 mg via ORAL
  Filled 2020-02-26 (×32): qty 1

## 2020-02-26 MED ORDER — DULOXETINE HCL 60 MG PO CPEP
60.0000 mg | ORAL_CAPSULE | Freq: Every day | ORAL | Status: DC
  Administered 2020-02-26 – 2020-03-12 (×16): 60 mg via ORAL
  Filled 2020-02-26: qty 1
  Filled 2020-02-26: qty 2
  Filled 2020-02-26 (×14): qty 1

## 2020-02-26 MED ORDER — LEVOTHYROXINE SODIUM 25 MCG PO TABS
25.0000 ug | ORAL_TABLET | Freq: Every day | ORAL | Status: DC
  Administered 2020-02-27 – 2020-02-29 (×3): 25 ug via ORAL
  Filled 2020-02-26 (×3): qty 1

## 2020-02-26 MED ORDER — DILTIAZEM HCL ER COATED BEADS 120 MG PO CP24
120.0000 mg | ORAL_CAPSULE | Freq: Every day | ORAL | Status: DC
  Administered 2020-02-27 – 2020-03-13 (×16): 120 mg via ORAL
  Filled 2020-02-26 (×17): qty 1

## 2020-02-26 MED ORDER — SODIUM CHLORIDE 0.9 % IV SOLN
2.0000 g | INTRAVENOUS | Status: DC
  Administered 2020-02-26 – 2020-02-29 (×4): 2 g via INTRAVENOUS
  Filled 2020-02-26: qty 2
  Filled 2020-02-26: qty 20
  Filled 2020-02-26: qty 2
  Filled 2020-02-26: qty 20
  Filled 2020-02-26: qty 2
  Filled 2020-02-26: qty 20

## 2020-02-26 MED ORDER — FUROSEMIDE 40 MG PO TABS: 20.0000 mg | ORAL_TABLET | Freq: Every day | ORAL | Status: DC

## 2020-02-26 MED ORDER — INSULIN REGULAR(HUMAN) IN NACL 100-0.9 UT/100ML-% IV SOLN: INTRAVENOUS | Status: DC

## 2020-02-26 MED ORDER — POLYVINYL ALCOHOL 1.4 % OP SOLN
1.0000 [drp] | Freq: Three times a day (TID) | OPHTHALMIC | Status: DC | PRN
  Filled 2020-02-26: qty 15

## 2020-02-26 MED ORDER — MORPHINE SULFATE ER 15 MG PO TBCR
30.0000 mg | EXTENDED_RELEASE_TABLET | Freq: Every day | ORAL | Status: DC
  Administered 2020-02-26 – 2020-03-12 (×16): 30 mg via ORAL
  Filled 2020-02-26 (×9): qty 2
  Filled 2020-02-26: qty 1
  Filled 2020-02-26 (×6): qty 2

## 2020-02-26 MED ORDER — POTASSIUM CHLORIDE 10 MEQ/100ML IV SOLN: 10.0000 meq | INTRAVENOUS | Status: AC

## 2020-02-26 NOTE — Progress Notes (Signed)
Jessica Dunlap, Jessica Dunlap ([id number]) Visit Report for 02/18/2020 Arrival Information Details Patient Name: Date of Service: [place], [place] 02/18/2020 10:45 A M Medical Record Number: [id number] Patient Account Number: [id number] Date of Birth/Sex: Treating RN: 1956-03-26 (64 y.o. Female) Carlene Coria Primary Care Naesha Buckalew: [employer] Other Clinician: Referring Gari Trovato: Treating Kirstin Kugler/Extender: Garfield Cornea Dunlap Treatment: 7 Visit Information History Since Last Visit All ordered tests and consults were completed: No Patient Arrived: Kasandra Knudsen Added or deleted any medications: No Arrival Time: 11:46 Any new allergies or adverse reactions: No Accompanied By: self Had a fall or experienced change Dunlap No Transfer Assistance: None activities of daily living that may affect Patient Identification Verified: Yes risk of falls: Secondary Verification Process Completed: Yes Signs or symptoms of abuse/neglect since last visito No Patient Requires Transmission-Based Precautions: No Hospitalized since last visit: No Patient Has Alerts: Yes Implantable device outside of the clinic excluding No Patient Alerts: R ABI =.94 TBI = .93 cellular tissue based products placed Dunlap the center L ABI= .95, TBI= .67 since last visit: Has Dressing Dunlap Place as Prescribed: Yes Pain Present Now: No Electronic Signature(s) Signed: 02/25/2020 5:50:16 PM By: Carlene Coria RN Entered By: Carlene Coria on 02/18/2020 11:47:10 -------------------------------------------------------------------------------- Clinic Level of Care Assessment Details Patient Name: Date of Service: Jessica Dunlap, Jessica Dunlap 02/18/2020 10:45 A M Medical Record Number: [id number] Patient Account Number: [id number] Date of Birth/Sex: Treating RN: 1956/04/28 (64 y.o. Female) Kela Millin Primary Care Lexton Hidalgo: [employer] Other Clinician: Referring Shamarr Faucett: Treating Alasdair Kleve/Extender: Garfield Cornea Dunlap  Treatment: 7 Clinic Level of Care Assessment Items TOOL 4 Quantity Score X- 1 0 Use when only an EandM is performed on FOLLOW-UP visit ASSESSMENTS - Nursing Assessment / Reassessment X- 1 10 Reassessment of Co-morbidities (includes updates Dunlap patient status) X- 1 5 Reassessment of Adherence to Treatment Plan ASSESSMENTS - Wound and Skin A ssessment / Reassessment X - Simple Wound Assessment / Reassessment - one wound 1 5 '[]'  - 0 Complex Wound Assessment / Reassessment - multiple wounds '[]'  - 0 Dermatologic / Skin Assessment (not related to wound area) ASSESSMENTS - Focused Assessment X- 1 5 Circumferential Edema Measurements - multi extremities '[]'  - 0 Nutritional Assessment / Counseling / Intervention '[]'  - 0 Lower Extremity Assessment (monofilament, tuning fork, pulses) '[]'  - 0 Peripheral Arterial Disease Assessment (using hand held doppler) ASSESSMENTS - Ostomy and/or Continence Assessment and Care '[]'  - 0 Incontinence Assessment and Management '[]'  - 0 Ostomy Care Assessment and Management (repouching, etc.) PROCESS - Coordination of Care X - Simple Patient / Family Education for ongoing care 1 15 '[]'  - 0 Complex (extensive) Patient / Family Education for ongoing care X- 1 10 Staff obtains Programmer, systems, Records, T Results / Process Orders est '[]'  - 0 Staff telephones HHA, Nursing Homes / Clarify orders / etc '[]'  - 0 Routine Transfer to another Facility (non-emergent condition) '[]'  - 0 Routine Hospital Admission (non-emergent condition) '[]'  - 0 New Admissions / Biomedical engineer / Ordering NPWT Apligraf, etc. , '[]'  - 0 Emergency Hospital Admission (emergent condition) X- 1 10 Simple Discharge Coordination '[]'  - 0 Complex (extensive) Discharge Coordination PROCESS - Special Needs '[]'  - 0 Pediatric / Minor Patient Management '[]'  - 0 Isolation Patient Management '[]'  - 0 Hearing / Language / Visual special needs '[]'  - 0 Assessment of Community assistance (transportation,  D/C planning, etc.) '[]'  - 0 Additional assistance / Altered mentation '[]'  - 0 Support Surface(s) Assessment (bed, cushion, seat, etc.) INTERVENTIONS - Wound  Cleansing / Measurement X - Simple Wound Cleansing - one wound 1 5 '[]'  - 0 Complex Wound Cleansing - multiple wounds X- 1 5 Wound Imaging (photographs - any number of wounds) '[]'  - 0 Wound Tracing (instead of photographs) X- 1 5 Simple Wound Measurement - one wound '[]'  - 0 Complex Wound Measurement - multiple wounds INTERVENTIONS - Wound Dressings X - Small Wound Dressing one or multiple wounds 1 10 '[]'  - 0 Medium Wound Dressing one or multiple wounds '[]'  - 0 Large Wound Dressing one or multiple wounds X- 1 5 Application of Medications - topical '[]'  - 0 Application of Medications - injection INTERVENTIONS - Miscellaneous '[]'  - 0 External ear exam X- 1 5 Specimen Collection (cultures, biopsies, blood, body fluids, etc.) X- 1 5 Specimen(s) / Culture(s) sent or taken to Lab for analysis '[]'  - 0 Patient Transfer (multiple staff / Civil Service fast streamer / Similar devices) '[]'  - 0 Simple Staple / Suture removal (25 or less) '[]'  - 0 Complex Staple / Suture removal (26 or more) '[]'  - 0 Hypo / Hyperglycemic Management (close monitor of Blood Glucose) '[]'  - 0 Ankle / Brachial Index (ABI) - do not check if billed separately X- 1 5 Vital Signs Has the patient been seen at the hospital within the last three years: Yes Total Score: 105 Level Of Care: New/Established - Level 3 Electronic Signature(s) Signed: 02/18/2020 4:56:28 PM By: Kela Millin Entered By: Kela Millin on 02/18/2020 12:48:03 -------------------------------------------------------------------------------- Lower Extremity Assessment Details Patient Name: Date of Service: Jessica Dunlap, Jessica Dunlap 02/18/2020 10:45 A M Medical Record Number: [id number] Patient Account Number: [id number] Date of Birth/Sex: Treating RN: Mar 29, 1956 (64 y.o. Female) Carlene Coria Primary Care Danner Paulding:  [employer] Other Clinician: Referring Rollan Roger: Treating Akira Perusse/Extender: Garfield Cornea Dunlap Treatment: 7 Edema Assessment Assessed: [Left: No] [Right: No] Edema: [Left: No] [Right: No] Calf Left: Right: Point of Measurement: cm From Medial Instep 29 cm 28 cm Ankle Left: Right: Point of Measurement: cm From Medial Instep 19 cm 17.5 cm Electronic Signature(s) Signed: 02/25/2020 5:50:16 PM By: Carlene Coria RN Entered By: Carlene Coria on 02/18/2020 11:49:33 -------------------------------------------------------------------------------- Mesic Details Patient Name: Date of Service: Jessica Dunlap, Jessica L. 02/18/2020 10:45 A M Medical Record Number: [id number] Patient Account Number: [id number] Date of Birth/Sex: Treating RN: 31-May-1956 (64 y.o. Female) Kela Millin Primary Care Faiz Weber: [employer] Other Clinician: Referring Glenette Bookwalter: Treating Carroll Ranney/Extender: Garfield Cornea Dunlap Treatment: 7 Active Inactive Wound/Skin Impairment Nursing Diagnoses: Impaired tissue integrity Goals: Patient/caregiver will verbalize understanding of skin care regimen Date Initiated: 02/15/2020 Target Resolution Date: 03/03/2020 Goal Status: Active Ulcer/skin breakdown will have a volume reduction of 30% by week 4 Date Initiated: 12/31/2019 Date Inactivated: 02/15/2020 Target Resolution Date: 02/04/2020 Goal Status: Met Interventions: Provide education on ulcer and skin care Notes: Electronic Signature(s) Signed: 02/18/2020 4:56:28 PM By: Kela Millin Entered By: Kela Millin on 02/18/2020 12:46:05 -------------------------------------------------------------------------------- Pain Assessment Details Patient Name: Date of Service: Jessica Dunlap, Jessica Dunlap 02/18/2020 10:45 A M Medical Record Number: [id number] Patient Account Number: [id number] Date of Birth/Sex: Treating RN: 10-31-1955 (64 y.o. Female) Carlene Coria Primary  Care Edlin Ford: [employer] Other Clinician: Referring Devery Murgia: Treating Curtez Brallier/Extender: Garfield Cornea Dunlap Treatment: 7 Active Problems Location of Pain Severity and Description of Pain Patient Has Paino No Site Locations Pain Management and Medication Current Pain Management: Electronic Signature(s) Signed: 02/25/2020 5:50:16 PM By: Carlene Coria RN Entered By: Carlene Coria on 02/18/2020 11:48:46 -------------------------------------------------------------------------------- Patient/Caregiver Education Details Patient Name:  Date of Service: Jessica Dunlap, Jessica Dunlap 8/20/2021andnbsp10:45 A M Medical Record Number: [id number] Patient Account Number: [id number] Date of Birth/Gender: Treating RN: 1955-11-22 (64 y.o. Female) Kela Millin Primary Care Physician: [employer] Other Clinician: Referring Physician: Treating Physician/Extender: Garfield Cornea Dunlap Treatment: 7 Education Assessment Education Provided To: Patient Education Topics Provided Wound/Skin Impairment: Handouts: Caring for Your Ulcer Methods: Explain/Verbal Responses: State content correctly Electronic Signature(s) Signed: 02/18/2020 4:56:28 PM By: Kela Millin Entered By: Kela Millin on 02/18/2020 12:46:52 -------------------------------------------------------------------------------- Wound Assessment Details Patient Name: Date of Service: Jessica Dunlap, Jessica Dunlap 02/18/2020 10:45 A M Medical Record Number: [id number] Patient Account Number: [id number] Date of Birth/Sex: Treating RN: 05/09/1956 (64 y.o. Female) Carlene Coria Primary Care Savana Spina: [employer] Other Clinician: Referring Norwood Quezada: Treating Dearius Hoffmann/Extender: Garfield Cornea Dunlap Treatment: 7 Wound Status Wound Number: 1 Primary Diabetic Wound/Ulcer of the Lower Extremity Etiology: Wound Location: Left, Lateral Foot Wound Open Wounding Event: Gradually  Appeared Status: Date Acquired: 10/30/2019 Comorbid Cataracts, Hypertension, Cirrhosis , Type II Diabetes, Weeks Of Treatment: 7 History: Rheumatoid Arthritis, Neuropathy Clustered Wound: No Photos Photo Uploaded By: Mikeal Hawthorne on 02/21/2020 14:51:45 Wound Measurements Length: (cm) 1.2 Width: (cm) 5.4 Depth: (cm) 0.5 Area: (cm) 5.089 Volume: (cm) 2.545 % Reduction Dunlap Area: -636.5% % Reduction Dunlap Volume: -3588.4% Epithelialization: None Tunneling: No Undermining: No Wound Description Classification: Grade 2 Wound Margin: Well defined, not attached Exudate Amount: Medium Exudate Type: Serosanguineous Exudate Color: red, brown Foul Odor After Cleansing: No Slough/Fibrino Yes Wound Bed Granulation Amount: Medium (34-66%) Exposed Structure Granulation Quality: Red Fascia Exposed: No Necrotic Amount: Medium (34-66%) Fat Layer (Subcutaneous Tissue) Exposed: Yes Necrotic Quality: Adherent Slough Tendon Exposed: No Muscle Exposed: No Joint Exposed: No Bone Exposed: No Electronic Signature(s) Signed: 02/25/2020 5:50:16 PM By: Carlene Coria RN Entered By: Carlene Coria on 02/18/2020 11:52:58 -------------------------------------------------------------------------------- Wound Assessment Details Patient Name: Date of Service: Jessica Dunlap, Jessica Dunlap 02/18/2020 10:45 A M Medical Record Number: [id number] Patient Account Number: [id number] Date of Birth/Sex: Treating RN: Nov 20, 1955 (64 y.o. Female) Carlene Coria Primary Care James Lafalce: [employer] Other Clinician: Referring Shauntay Brunelli: Treating Newt Levingston/Extender: Garfield Cornea Dunlap Treatment: 7 Wound Status Wound Number: 2 Primary Diabetic Wound/Ulcer of the Lower Extremity Etiology: Wound Location: Right T [place] o[place] Wound Open Wounding Event: Gradually Appeared Status: Date Acquired: 10/11/2019 Comorbid Cataracts, Hypertension, Cirrhosis , Type II Diabetes, Weeks Of Treatment: 7 History: Rheumatoid  Arthritis, Neuropathy Clustered Wound: No Wound Measurements Length: (cm) 0.3 Width: (cm) 0.2 Depth: (cm) 0.2 Area: (cm) 0.047 Volume: (cm) 0.009 % Reduction Dunlap Area: 0% % Reduction Dunlap Volume: 0% Tunneling: No Undermining: No Wound Description Classification: Grade 1 Wound Margin: Distinct, outline attached Exudate Amount: Medium Exudate Type: Serosanguineous Exudate Color: red, brown Foul Odor After Cleansing: No Slough/Fibrino No Wound Bed Granulation Amount: Large (67-100%) Exposed Structure Granulation Quality: Red Fascia Exposed: No Necrotic Amount: None Present (0%) Fat Layer (Subcutaneous Tissue) Exposed: Yes Tendon Exposed: No Muscle Exposed: No Joint Exposed: No Bone Exposed: No Electronic Signature(s) Signed: 02/25/2020 5:50:16 PM By: Carlene Coria RN Entered By: Carlene Coria on 02/18/2020 11:53:06 -------------------------------------------------------------------------------- Vitals Details Patient Name: Date of Service: Jessica Dunlap, Jessica German. 02/18/2020 10:45 A M Medical Record Number: [id number] Patient Account Number: [id number] Date of Birth/Sex: Treating RN: September 28, 1955 (64 y.o. Female) Carlene Coria Primary Care Rhyder Bratz: [employer] Other Clinician: Referring Byron Peacock: Treating Ossie Beltran/Extender: Garfield Cornea Dunlap Treatment: 7 Vital Signs Time Taken: 11:47 Temperature (F): 98.2 Height (Dunlap): 63 Pulse (bpm): 97  Weight (lbs): 131 Respiratory Rate (breaths/min): 18 Body Mass Index (BMI): 23.2 Blood Pressure (mmHg): 147/71 Reference Range: 80 - 120 mg / dl Electronic Signature(s) Signed: 02/25/2020 5:50:16 PM By: Carlene Coria RN Entered By: Carlene Coria on 02/18/2020 11:48:28

## 2020-02-26 NOTE — ED Notes (Signed)
Patient transported to X-ray 

## 2020-02-26 NOTE — ED Notes (Signed)
Date and time results received: 02/26/20 1807 (use smartphrase ".now" to insert current time)  Test: Lactic Acid Critical Value: 3.9  Name of Provider Notified: Dr.Miller  Orders Received? Or Actions Taken?:

## 2020-02-26 NOTE — Progress Notes (Signed)
Jessica Dunlap, Jessica Dunlap ([id number]) Visit Report for 02/15/2020 Arrival Information Details Patient Name: Date of Service: [place], [place] 02/15/2020 9:45 A M Medical Record Number: [id number] Patient Account Number: [id number] Date of Birth/Sex: Treating RN: August 21, 1955 (64 y.o. Female) Carlene Coria Primary Care Gagandeep Kossman: [employer] Other Clinician: Referring Khing Belcher: Treating Aldean Pipe/Extender: Garfield Cornea Dunlap Treatment: 6 Visit Information History Since Last Visit Added or deleted any medications: No Patient Arrived: Jessica Dunlap Any new allergies or adverse reactions: No Arrival Time: 09:28 Had a fall or experienced change Dunlap No Accompanied By: sister activities of daily living that may affect Transfer Assistance: None risk of falls: Patient Identification Verified: Yes Signs or symptoms of abuse/neglect since last visito No Secondary Verification Process Completed: Yes Hospitalized since last visit: No Patient Requires Transmission-Based Precautions: No Implantable device outside of the clinic excluding No Patient Has Alerts: Yes cellular tissue based products placed Dunlap the center Patient Alerts: R ABI =.94 TBI = .93 since last visit: L ABI= .95, TBI= .67 Has Dressing Dunlap Place as Prescribed: Yes Pain Present Now: Yes Electronic Signature(s) Signed: 02/17/2020 1:30:39 PM By: Sandre Kitty Entered By: Sandre Kitty on 02/15/2020 09:28:58 -------------------------------------------------------------------------------- Encounter Discharge Information Details Patient Name: Date of Service: Jessica Dunlap, Jessica L. 02/15/2020 9:45 A M Medical Record Number: [id number] Patient Account Number: [id number] Date of Birth/Sex: Treating RN: Apr 18, 1956 (64 y.o. Female) Kela Millin Primary Care Jashae Wiggs: [employer] Other Clinician: Referring Joann Kulpa: Treating Matas Burrows/Extender: Garfield Cornea Dunlap Treatment: 6 Encounter Discharge Information Items  Post Procedure Vitals Discharge Condition: Stable Temperature (F): 97.9 Ambulatory Status: Ambulatory Pulse (bpm): 90 Discharge Destination: Home Respiratory Rate (breaths/min): 18 Transportation: Private Auto Blood Pressure (mmHg): 153/76 Accompanied By: family member Schedule Follow-up Appointment: Yes Clinical Summary of Care: Patient Declined Electronic Signature(s) Signed: 02/15/2020 5:06:17 PM By: Kela Millin Entered By: Kela Millin on 02/15/2020 11:31:30 -------------------------------------------------------------------------------- Lower Extremity Assessment Details Patient Name: Date of Service: Jessica Dunlap, Jessica SAWCH[place]. 02/15/2020 9:45 A M Medical Record Number: [id number] Patient Account Number: [id number] Date of Birth/Sex: Treating RN: 1955/12/25 (64 y.o. Female) Kela Millin Primary Care Arris Meyn: [employer] Other Clinician: Referring Marsella Suman: Treating Stuti Sandin/Extender: Garfield Cornea Dunlap Treatment: 6 Edema Assessment Assessed: Shirlyn Goltz: No] [Right: No] Edema: [Left: No] [Right: No] Calf Left: Right: Point of Measurement: cm From Medial Instep 29 cm 28 cm Ankle Left: Right: Point of Measurement: cm From Medial Instep 19 cm 17.5 cm Vascular Assessment Pulses: Dorsalis Pedis Palpable: [Left:Yes] [Right:Yes] Electronic Signature(s) Signed: 02/15/2020 5:06:17 PM By: Kela Millin Entered By: Kela Millin on 02/15/2020 09:43:31 -------------------------------------------------------------------------------- Multi Wound Chart Details Patient Name: Date of Service: Jessica Dunlap, Jessica L. 02/15/2020 9:45 A M Medical Record Number: [id number] Patient Account Number: [id number] Date of Birth/Sex: Treating RN: [birth year] (64 y.o. Female) Carlene Coria Primary Care Valoria Tamburri: [employer] Other Clinician: Referring Argil Mahl: Treating Mikhia Dusek/Extender: Garfield Cornea Dunlap Treatment: 6 Vital Signs Height(Dunlap):  61 Pulse(bpm): 23 Weight(lbs): 131 Blood Pressure(mmHg): 153/76 Body Mass Index(BMI): 23 Temperature(F): 97.9 Respiratory Rate(breaths/min): 18 Photos: [1:No Photos Left, Lateral Foot] [2:No Photos Right T Great oe] [N/A:N/A N/A] Wound Location: [1:Gradually Appeared] [2:Gradually Appeared] [N/A:N/A] Wounding Event: [1:Diabetic Wound/Ulcer of the Lower] [2:Diabetic Wound/Ulcer of the Lower] [N/A:N/A] Primary Etiology: [1:Extremity Cataracts, Hypertension, Cirrhosis , Cataracts, Hypertension, Cirrhosis , N/A] [2:Extremity] Comorbid History: [1:Type II Diabetes, Rheumatoid Arthritis, Type II Diabetes, Rheumatoid Arthritis, Neuropathy 10/30/2019] [2:Neuropathy 10/11/2019] [N/A:N/A] Date Acquired: [1:6] [2:6] [N/A:N/A] Weeks of Treatment: [1:Open] [2:Open] [N/A:N/A] Wound Status: [1:0.8x1.2x0.5] [2:0.3x0.2x0.2] [N/A:N/A] Measurements  L x W x D (cm) [1:0.754] [2:0.047] [N/A:N/A] A (cm) : rea [1:0.377] [2:0.009] [N/A:N/A] Volume (cm) : [1:-9.10%] [2:0.00%] [N/A:N/A] % Reduction Dunlap A rea: [1:-446.40%] [2:0.00%] [N/A:N/A] % Reduction Dunlap Volume: [1:Grade 2] [2:Grade 1] [N/A:N/A] Classification: [1:Medium] [2:Medium] [N/A:N/A] Exudate A mount: [1:Serosanguineous] [2:Serosanguineous] [N/A:N/A] Exudate Type: [1:red, brown] [2:red, brown] [N/A:N/A] Exudate Color: [1:Well defined, not attached] [2:Distinct, outline attached] [N/A:N/A] Wound Margin: [1:Medium (34-66%)] [2:Large (67-100%)] [N/A:N/A] Granulation A mount: [1:Red] [2:Red] [N/A:N/A] Granulation Quality: [1:Medium (34-66%)] [2:None Present (0%)] [N/A:N/A] Necrotic A mount: [1:Fat Layer (Subcutaneous Tissue): Yes Fat Layer (Subcutaneous Tissue): Yes N/A] Exposed Structures: [1:Fascia: No Tendon: No Muscle: No Joint: No Bone: No None] [2:Fascia: No Tendon: No Muscle: No Joint: No Bone: No N/A] [N/A:N/A] Epithelialization: [1:Debridement - Excisional] [2:Debridement - Excisional] [N/A:N/A] Debridement: Pre-procedure Verification/Time Out  10:26 [2:10:26] [N/A:N/A] Taken: [1:Other] [2:Other] [N/A:N/A] Pain Control: [1:Callus, Subcutaneous] [2:Callus, Subcutaneous] [N/A:N/A] Tissue Debrided: [1:Skin/Subcutaneous Tissue] [2:Skin/Subcutaneous Tissue] [N/A:N/A] Level: [1:0.96] [2:0.06] [N/A:N/A] Debridement A (sq cm): [1:rea Curette] [2:Curette] [N/A:N/A] Instrument: [1:Minimum] [2:Minimum] [N/A:N/A] Bleeding: [1:Pressure] [2:Pressure] [N/A:N/A] Hemostasis A chieved: [1:0] [2:0] [N/A:N/A] Procedural Pain: [1:0] [2:0] [N/A:N/A] Post Procedural Pain: [1:Procedure was tolerated well] [2:Procedure was tolerated well] [N/A:N/A] Debridement Treatment Response: [1:0.8x1.2x0.5] [2:0.3x0.2x0.2] [N/A:N/A] Post Debridement Measurements L x W x D (cm) [1:0.377] [2:0.009] [N/A:N/A] Post Debridement Volume: (cm) [1:Debridement] [2:Debridement] [N/A:N/A] Treatment Notes Electronic Signature(s) Signed: 02/15/2020 6:57:29 PM By: Linton Ham MD Signed: 02/25/2020 5:50:16 PM By: Carlene Coria RN Entered By: Linton Ham on 02/15/2020 11:07:23 -------------------------------------------------------------------------------- Multi-Disciplinary Care Plan Details Patient Name: Date of Service: Jessica Dunlap, Jessica L. 02/15/2020 9:45 A M Medical Record Number: [id number] Patient Account Number: [id number] Date of Birth/Sex: Treating RN: [birth year] (64 y.o. Female) Levan Hurst Primary Care Bresha Hosack: [employer] Other Clinician: Referring Jakhia Buxton: Treating Cristofer Yaffe/Extender: Garfield Cornea Dunlap Treatment: 6 Active Inactive Wound/Skin Impairment Nursing Diagnoses: Impaired tissue integrity Goals: Patient/caregiver will verbalize understanding of skin care regimen Date Initiated: 02/15/2020 Target Resolution Date: 03/03/2020 Goal Status: Active Ulcer/skin breakdown will have a volume reduction of 30% by week 4 Date Initiated: 12/31/2019 Date Inactivated: 02/15/2020 Target Resolution Date: 02/04/2020 Goal Status:  Met Interventions: Provide education on ulcer and skin care Notes: Electronic Signature(s) Signed: 02/17/2020 5:36:57 PM By: Levan Hurst RN, BSN Entered By: Levan Hurst on 02/15/2020 10:26:15 -------------------------------------------------------------------------------- Pain Assessment Details Patient Name: Date of Service: Jessica Dunlap, Jessica German. 02/15/2020 9:45 A M Medical Record Number: [id number] Patient Account Number: [id number] Date of Birth/Sex: Treating RN: 04-10-1956 (64 y.o. Female) Carlene Coria Primary Care Asuncion Shibata: [employer] Other Clinician: Referring Kianni Lheureux: Treating Ernesto Zukowski/Extender: Garfield Cornea Dunlap Treatment: 6 Active Problems Location of Pain Severity and Description of Pain Patient Has Paino Yes Site Locations Rate the pain. Current Pain Level: 4 Pain Management and Medication Current Pain Management: Electronic Signature(s) Signed: 02/17/2020 1:30:39 PM By: Sandre Kitty Signed: 02/25/2020 5:50:16 PM By: Carlene Coria RN Entered By: Sandre Kitty on 02/15/2020 62:95:28 -------------------------------------------------------------------------------- Patient/Caregiver Education Details Patient Name: Date of Service: Jessica Dunlap, Jessica German 8/17/2021andnbsp9:45 A M Medical Record Number: [id number] Patient Account Number: [id number] Date of Birth/Gender: Treating RN: [birth year] (64 y.o. Female) Levan Hurst Primary Care Physician: [employer] Other Clinician: Referring Physician: Treating Physician/Extender: Garfield Cornea Dunlap Treatment: 6 Education Assessment Education Provided To: Patient Education Topics Provided Wound/Skin Impairment: Methods: Explain/Verbal Responses: State content correctly Electronic Signature(s) Signed: 02/17/2020 5:36:57 PM By: Levan Hurst RN, BSN Entered By: Levan Hurst on 02/15/2020  10:26:26 -------------------------------------------------------------------------------- Wound Assessment Details Patient Name: Date of Service:  Jessica Dunlap, Jessica L. 02/15/2020 9:45 A M Medical Record Number: [id number] Patient Account Number: [id number] Date of Birth/Sex: Treating RN: 10-22-1955 (64 y.o. Female) Carlene Coria Primary Care Cedric Denison: [employer] Other Clinician: Referring Elaynah Virginia: Treating Hagen Bohorquez/Extender: Garfield Cornea Dunlap Treatment: 6 Wound Status Wound Number: 1 Primary Diabetic Wound/Ulcer of the Lower Extremity Etiology: Wound Location: Left, Lateral Foot Wound Open Wounding Event: Gradually Appeared Status: Date Acquired: 10/30/2019 Comorbid Cataracts, Hypertension, Cirrhosis , Type II Diabetes, Weeks Of Treatment: 6 History: Rheumatoid Arthritis, Neuropathy Clustered Wound: No Photos Photo Uploaded By: Mikeal Hawthorne on 02/16/2020 11:56:23 Wound Measurements Length: (cm) 0.8 Width: (cm) 1.2 Depth: (cm) 0.5 Area: (cm) 0.754 Volume: (cm) 0.377 % Reduction Dunlap Area: -9.1% % Reduction Dunlap Volume: -446.4% Epithelialization: None Tunneling: No Undermining: No Wound Description Classification: Grade 2 Wound Margin: Well defined, not attached Exudate Amount: Medium Exudate Type: Serosanguineous Exudate Color: red, brown Foul Odor After Cleansing: No Slough/Fibrino Yes Wound Bed Granulation Amount: Medium (34-66%) Exposed Structure Granulation Quality: Red Fascia Exposed: No Necrotic Amount: Medium (34-66%) Fat Layer (Subcutaneous Tissue) Exposed: Yes Necrotic Quality: Adherent Slough Tendon Exposed: No Muscle Exposed: No Joint Exposed: No Bone Exposed: No Electronic Signature(s) Signed: 02/15/2020 5:06:17 PM By: Kela Millin Signed: 02/25/2020 5:50:16 PM By: Carlene Coria RN Entered By: Kela Millin on 02/15/2020 09:43:56 -------------------------------------------------------------------------------- Wound  Assessment Details Patient Name: Date of Service: Jessica Dunlap, Jessica L. 02/15/2020 9:45 A M Medical Record Number: [id number] Patient Account Number: [id number] Date of Birth/Sex: Treating RN: January 11, 1956 (64 y.o. Female) Carlene Coria Primary Care Naliya Gish: [employer] Other Clinician: Referring Maudean Hoffmann: Treating Toleen Lachapelle/Extender: Garfield Cornea Dunlap Treatment: 6 Wound Status Wound Number: 2 Primary Diabetic Wound/Ulcer of the Lower Extremity Etiology: Wound Location: Right T Great oe Wound Open Wounding Event: Gradually Appeared Status: Date Acquired: 10/11/2019 Comorbid Cataracts, Hypertension, Cirrhosis , Type II Diabetes, Weeks Of Treatment: 6 History: Rheumatoid Arthritis, Neuropathy Clustered Wound: No Photos Photo Uploaded By: Mikeal Hawthorne on 02/16/2020 11:56:24 Wound Measurements Length: (cm) 0.3 Width: (cm) 0.2 Depth: (cm) 0.2 Area: (cm) 0.047 Volume: (cm) 0.009 % Reduction Dunlap Area: 0% % Reduction Dunlap Volume: 0% Wound Description Classification: Grade 1 Wound Margin: Distinct, outline attached Exudate Amount: Medium Exudate Type: Serosanguineous Exudate Color: red, brown Foul Odor After Cleansing: No Slough/Fibrino No Wound Bed Granulation Amount: Large (67-100%) Exposed Structure Granulation Quality: Red Fascia Exposed: No Necrotic Amount: None Present (0%) Fat Layer (Subcutaneous Tissue) Exposed: Yes Tendon Exposed: No Muscle Exposed: No Joint Exposed: No Bone Exposed: No Electronic Signature(s) Signed: 02/15/2020 5:06:17 PM By: Kela Millin Signed: 02/25/2020 5:50:16 PM By: Carlene Coria RN Entered By: Kela Millin on 02/15/2020 09:44:17 -------------------------------------------------------------------------------- Vitals Details Patient Name: Date of Service: Jessica Dunlap, Jessica L. 02/15/2020 9:45 A M Medical Record Number: [id number] Patient Account Number: [id number] Date of Birth/Sex: Treating RN: Oct 21, 1955 (64 y.o.  Female) Carlene Coria Primary Care Dahlton Hinde: [employer] Other Clinician: Referring Jacqlyn Marolf: Treating Finnlee Silvernail/Extender: Garfield Cornea Dunlap Treatment: 6 Vital Signs Time Taken: 09:28 Temperature (F): 97.9 Height (Dunlap): 63 Pulse (bpm): 90 Weight (lbs): 131 Respiratory Rate (breaths/min): 18 Body Mass Index (BMI): 23.2 Blood Pressure (mmHg): 153/76 Reference Range: 80 - 120 mg / dl Electronic Signature(s) Signed: 02/17/2020 1:30:39 PM By: Sandre Kitty Entered By: Sandre Kitty on 02/15/2020 09:29:14

## 2020-02-26 NOTE — ED Provider Notes (Signed)
[facility] EMERGENCY DEPARTMENT Provider Note   CSN: [id number] Arrival date & time: 02/26/20  1637     History Chief Complaint  Patient presents with  . Foot Ulcer    Jessica Dunlap is a 64 y.o. female.  HPI   This patient is a 64 year old female, she has a history of nonalcoholic cirrhosis, diabetes and peripheral neuropathy from the same, for which she takes Metformin as well as glipizide.  She presents to the hospital today with left lower extremity redness swelling and discomfort.  She states that approximately 2 months ago she started to develop an ulcer over the base of the fifth metatarsal of the left foot.  This is gradually worsened and was gradually getting worse, she ultimately went has been going to the wound care center where she has been for the last month however was told that if things were getting worse with swelling or redness that she should come to the hospital for evaluation.  She has noticed over the last 72 hours there is gradual progression of swelling redness pain and foul smell.  She has had drainage on the dressings of her foot and though she has not noticed any fever she does have increased pain especially in the morning when she gets up.  She has been asked to use a special brace for her foot but states that she does not like the way it feels so she has not been using it.  The symptoms are gradually worsening, they are persistent, and is worse with palpation.  Past Medical History:  Diagnosis Date  . Anemia   . Cancer (HCC)    cervical  . Cirrhosis, non-alcoholic ([place])   . Coronary artery disease   . Depression   . Diabetes mellitus without complication ([place])   . Hypertension   . Hypothyroidism   . Neuropathy   . Tachycardia   . Thyroid disease     There are no problems to display for this patient.   Past Surgical History:  Procedure Laterality Date  . ABDOMINAL HYSTERECTOMY    . APPENDECTOMY    . CATARACT EXTRACTION W/PHACO Left 12/08/2017    Procedure: CATARACT EXTRACTION PHACO AND INTRAOCULAR LENS PLACEMENT LEFT EYE;  Surgeon: Tonny Branch, MD;  Location: [facility];  Service: Ophthalmology;  Laterality: Left;  CDE: 10.09  . CATARACT EXTRACTION W/PHACO Right 12/22/2017   Procedure: CATARACT EXTRACTION PHACO AND INTRAOCULAR LENS PLACEMENT RIGHT EYE;  Surgeon: Tonny Branch, MD;  Location: [facility];  Service: Ophthalmology;  Laterality: Right;  CDE: 14.20     OB History   No obstetric history on file.     History reviewed. No pertinent family history.  Social History   Tobacco Use  . Smoking status: Former Smoker    Packs/day: 1.50    Years: 30.00    Pack years: 45.00    Types: Cigarettes    Quit date: 12/03/2007    Years since quitting: 12.2  . Smokeless tobacco: Never Used  Vaping Use  . Vaping Use: Never used  Substance Use Topics  . Alcohol use: No  . Drug use: No    Home Medications Prior to Admission medications   Medication Sig Start Date End Date Taking? Authorizing Provider  alendronate (FOSAMAX) 70 MG tablet Take 70 mg by mouth once a week. Take with a full glass of water on an empty stomach.    [provider]  anti-nausea (EMETROL) solution Take 10 mLs by mouth as needed for nausea or vomiting.  [provider]  aspirin EC 81 MG tablet Take 81 mg by mouth daily.    [provider]  canagliflozin (INVOKANA) 100 MG TABS tablet Take 100 mg by mouth daily.    [provider]  carbamazepine (TEGRETOL) 100 MG chewable tablet Chew 100 mg by mouth 2 (two) times daily. 11/03/17   [provider]  diltiazem (TIAZAC) 120 MG 24 hr capsule Take 120 mg by mouth daily. 10/31/17   [provider]  doxycycline (VIBRA-TABS) 100 MG tablet Take 1 tablet (100 mg total) by mouth 2 (two) times daily. 02/25/18   Francine Graven, DO  DULoxetine (CYMBALTA) 60 MG capsule Take 60 mg by mouth at bedtime.    [provider]  glipiZIDE (GLUCOTROL) 10 MG tablet Take 10 mg by mouth  daily.    [provider]  HYDROmorphone (DILAUDID) 8 MG tablet Take 16 mg by mouth 3 (three) times daily as needed for moderate pain.     [provider]  ibuprofen (ADVIL,MOTRIN) 200 MG tablet Take 400 mg by mouth daily as needed for headache or moderate pain.    [provider]  levothyroxine (SYNTHROID, LEVOTHROID) 200 MCG tablet Take 200 mcg by mouth at bedtime. Take one daily along with 76mcg tablets for total dose of 225 mcg.    [provider]  levothyroxine (SYNTHROID, LEVOTHROID) 25 MCG tablet Take 25 mcg by mouth at bedtime. Take one tablet along with 270mcg tablet for total dose of 261mcg.    [provider]  metFORMIN (GLUCOPHAGE) 500 MG tablet Take 1 tablet (500 mg total) by mouth 2 (two) times daily with a meal. Patient taking differently: Take 1,000 mg by mouth 2 (two) times daily with a meal.  03/13/12 11/28/17  Nat Christen, MD  metoprolol (LOPRESSOR) 50 MG tablet Take 50 mg by mouth 2 (two) times daily.    [provider]  Morphine Sulfate ER 60 MG T12A Take 120 mg by mouth at bedtime.    [provider]  Polyethyl Glycol-Propyl Glycol (SYSTANE OP) Place 1 drop into both eyes 3 (three) times daily as needed (dry eyes).    [provider]  spironolactone (ALDACTONE) 25 MG tablet Take 50 mg by mouth daily.    [provider]  traZODone (DESYREL) 150 MG tablet Take 150 mg by mouth at bedtime. For sleep    [provider]  vitamin E 400 UNIT capsule Take 400 Units by mouth 2 (two) times daily.    [provider]    Allergies    Patient has no known allergies.  Review of Systems   Review of Systems  All other systems reviewed and are negative.   Physical Exam Updated Vital Signs BP (!) 141/63 (BP Location: Right Arm)   Pulse 93   Temp 98.7 F (37.1 C) (Oral)   Resp 18   Ht 1.6 m (5\' 3" )   Wt 59.4 kg   SpO2 99%   BMI 23.21 kg/m   Physical Exam Vitals and nursing note  reviewed.  Constitutional:      General: She is not in acute distress.    Appearance: She is well-developed.  HENT:     Head: Normocephalic and atraumatic.     Mouth/Throat:     Pharynx: No oropharyngeal exudate.  Eyes:     General: No scleral icterus.       Right eye: No discharge.        Left eye: No discharge.  Conjunctiva/sclera: Conjunctivae normal.     Pupils: Pupils are equal, round, and reactive to light.  Neck:     Thyroid: No thyromegaly.     Vascular: No JVD.  Cardiovascular:     Rate and Rhythm: Normal rate and regular rhythm.     Heart sounds: Normal heart sounds. No murmur heard.  No friction rub. No gallop.   Pulmonary:     Effort: Pulmonary effort is normal. No respiratory distress.     Breath sounds: Normal breath sounds. No wheezing or rales.  Abdominal:     General: Bowel sounds are normal. There is no distension.     Palpations: Abdomen is soft. There is no mass.     Tenderness: There is no abdominal tenderness.  Musculoskeletal:        General: Swelling, tenderness and deformity present. Normal range of motion.     Cervical back: Normal range of motion and neck supple.     Comments: The right lower extremity appears normal except for the great toe, on the pad there is a small area draining purulence, the left lower extremity is edematous red warm and hot and painful to the touch from the knee through the ankle.  There is a inversion type ankle deformity as well as a large ulcerative lesion over the lateral foot and the base of the foot where there is purulent foul-smelling drainage.  The redness and warmth extend in a patchy distribution from the foot all the way up towards the knee.  Lymphadenopathy:     Cervical: No cervical adenopathy.  Skin:    General: Skin is warm and dry.     Findings: Erythema and rash present.  Neurological:     Mental Status: She is alert.     Coordination: Coordination normal.  Psychiatric:        Behavior: Behavior normal.      ED Results / Procedures / Treatments   Labs (all labs ordered are listed, but only abnormal results are displayed) Labs Reviewed  CBC WITH DIFFERENTIAL/PLATELET - Abnormal; Notable for the following components:      Result Value   WBC 16.0 (*)    RBC 3.69 (*)    Hemoglobin 11.4 (*)    HCT 32.8 (*)    Neutro Abs 14.4 (*)    Lymphs Abs 0.5 (*)    Abs Immature Granulocytes 0.14 (*)    All other components within normal limits  COMPREHENSIVE METABOLIC PANEL - Abnormal; Notable for the following components:   Sodium 128 (*)    Chloride 91 (*)    CO2 21 (*)    Glucose, Bld 367 (*)    Total Protein 8.3 (*)    AST 14 (*)    Anion gap 16 (*)    All other components within normal limits  LACTIC ACID, PLASMA - Abnormal; Notable for the following components:   Lactic Acid, Venous 3.9 (*)    All other components within normal limits  CBG MONITORING, ED - Abnormal; Notable for the following components:   Glucose-Capillary 359 (*)    All other components within normal limits  AEROBIC CULTURE (SUPERFICIAL SPECIMEN)  SARS CORONAVIRUS 2 BY RT PCR (HOSPITAL ORDER, [place] [facility])  CBG MONITORING, ED    EKG None  Radiology DG Tibia/Fibula Left  Result Date: 02/26/2020 CLINICAL DATA:  Left lower extremity redness. EXAM: LEFT TIBIA AND FIBULA - 2 VIEW COMPARISON:  None. FINDINGS: There is no evidence of fracture or other focal  bone lesions. Mild soft tissue swelling is seen along the anterior aspect of the distal left tibia. This is seen on the lateral view. IMPRESSION: Mild soft tissue swelling without evidence of an acute osseous abnormality. Electronically Signed   By: Virgina Norfolk M.D.   On: 02/26/2020 17:33   DG Ankle Complete Left  Result Date: 02/26/2020 CLINICAL DATA:  Left foot ulcer with left tib/fib redness. EXAM: LEFT ANKLE COMPLETE - 3+ VIEW COMPARISON:  None. FINDINGS: There is no evidence of fracture, dislocation, or joint effusion. Mild  degenerative changes seen along the dorsal aspect of the mid left foot. Moderate severity soft tissue swelling is noted along the medial aspect of the left ankle. Moderate severity soft tissue swelling of the dorsal aspect of the mid left foot is also noted. IMPRESSION: 1. Moderate severity medial soft tissue swelling without evidence of acute fracture. 2. Mild degenerative changes without evidence of an acute osseous abnormality. Electronically Signed   By: Virgina Norfolk M.D.   On: 02/26/2020 17:35   DG Foot Complete Left  Result Date: 02/26/2020 CLINICAL DATA:  Left foot ulcer. EXAM: LEFT FOOT - COMPLETE 3+ VIEW COMPARISON:  None. FINDINGS: There is no evidence of an acute fracture or dislocation. Mild degenerative changes are seen along the dorsal aspect of the mid left foot. There is moderate severity soft tissue swelling of the dorsal aspect of the distal left foot. A 1.3 cm x 0.7 cm superficial soft tissue defect is seen along the lateral aspect of the mid left foot. This is adjacent to the base of the fifth left metatarsal. Mild-to-moderate severity focal soft tissue swelling is also seen within this region. IMPRESSION: 1. Soft tissue ulceration along the lateral aspect of the mid left foot with associated soft tissue swelling. 2. No acute fracture or acute osteomyelitis. 3. Mild degenerative changes, as described above. Electronically Signed   By: Virgina Norfolk M.D.   On: 02/26/2020 17:37   DG Toe Great Right  Result Date: 02/26/2020 CLINICAL DATA:  Left foot ulcer. EXAM: RIGHT GREAT TOE COMPARISON:  None. FINDINGS: There is no evidence of an acute fracture or dislocation. A chronic appearing deformity is seen involving the tuft of the distal phalanx of the right great toe. Moderate severity diffuse soft tissue swelling is noted. IMPRESSION: Soft tissue swelling without evidence of acute osteomyelitis. MRI correlation is recommended if this remains of clinical concern. Electronically Signed    By: Virgina Norfolk M.D.   On: 02/26/2020 17:32    Procedures .Critical Care Performed by: Noemi Chapel, MD Authorized by: Noemi Chapel, MD   Critical care provider statement:    Critical care time (minutes):  35   Critical care time was exclusive of:  Separately billable procedures and treating other patients and teaching time   Critical care was necessary to treat or prevent imminent or life-threatening deterioration of the following conditions:  Endocrine crisis   Critical care was time spent personally by me on the following activities:  Blood draw for specimens, development of treatment plan with patient or surrogate, discussions with consultants, evaluation of patient's response to treatment, examination of patient, obtaining history from patient or surrogate, ordering and performing treatments and interventions, ordering and review of laboratory studies, ordering and review of radiographic studies, pulse oximetry, re-evaluation of patient's condition and review of old charts Comments:         (including critical care time)  Medications Ordered in ED Medications  sodium chloride 0.9 % bolus 1,000 mL (has  no administration in time range)  insulin regular, human (MYXREDLIN) 100 units/ 100 mL infusion (has no administration in time range)  lactated ringers infusion (has no administration in time range)  dextrose 5 % in lactated ringers infusion (has no administration in time range)  dextrose 50 % solution 0-50 mL (has no administration in time range)  potassium chloride 10 mEq in 100 mL IVPB (has no administration in time range)  piperacillin-tazobactam (ZOSYN) IVPB 3.375 g (0 g Intravenous Stopped 02/26/20 1809)    ED Course  I have reviewed the triage vital signs and the nursing notes.  Pertinent labs & imaging results that were available during my care of the patient were reviewed by me and considered in my medical decision making (see chart for details).  Clinical Course  as of Feb 26 1820  Sat Feb 26, 2020  1749 I have personally viewed all of the x-rays including the left foot, left ankle, left tibia and fibula and the right great toe.  I do not think there are any signs of deep tissue or bony infections however the soft tissue swelling correlates with the ulcerations.  Blood sugar is 359, the patient will need IV fluids and possibly some insulin, her vital signs remain without tachycardia however the other labs have not yet resulted.  Antibiotics are being infused at this time   [BM]  1818 The laboratory work-up shows multiple abnormalities including not only hyperglycemia but hyponatremia which in part is related to the glucose, her CO2 is slightly depressed at 21 and she has an anion gap of 16.  Her lactic acid is 3.9, this could be related in part to her hyperglycemia but also to infection.  Given the absence of fever or tachycardia I suspect that part of this is related to the hyperglycemia.  That most likely is directly impacted by the infection.  We will give IV fluids and insulin, I do not think the patient is in septic shock given her normal and unremarkable vital signs thus 30 cc/kg of IV fluids will be held.  She has been giving antibiotics for the appropriate infection, she will be given 1 L of IV fluids and admitted to the hospitalist   [BM]    Clinical Course User Index [BM] Noemi Chapel, MD   MDM Rules/Calculators/A&P                          The patient has what appears to be infected diabetic ulcers with extending cellulitis up the knee.  She is immunocompromise with diabetes but thankfully afebrile and is not tachycardic.  Given the extent of the infection and the possibility for osteomyelitis I think she needs x-rays antibiotics and admission to the hospital.  We will start the process with labs, IV and Zosyn per the antibiotic recommendations from pharmacy in the order set.  I discussed the care with Dr. Sheran Lawless who will admit the patient to the  hospital.  Final Clinical Impression(s) / ED Diagnoses Final diagnoses:  Cellulitis of left lower extremity  Diabetic ketoacidosis without coma associated with type 2 diabetes mellitus ([place])      Noemi Chapel, MD 02/26/20 [phone]

## 2020-02-26 NOTE — Progress Notes (Signed)
Jessica Dunlap, Jessica Dunlap ([id number]) Visit Report for 02/15/2020 Debridement Details Patient Name: Date of Service: FA IN, [place] 02/15/2020 9:45 A M Medical Record Number: [id number] Patient Account Number: [id number] Date of Birth/Sex: Treating RN: Jun 10, 1956 (64 y.o. Female) Carlene Coria Primary Care Provider: [employer] Other Clinician: Referring Provider: Treating Provider/Extender: Garfield Cornea in Treatment: 6 Debridement Performed for Assessment: Wound #1 Left,Lateral Foot Performed By: Physician Ricard Dillon., MD Debridement Type: Debridement Severity of Tissue Pre Debridement: Fat layer exposed Level of Consciousness (Pre-procedure): Awake and Alert Pre-procedure Verification/Time Out Yes - 10:26 Taken: Start Time: 10:26 Pain Control: Other : Benzocaine 20% T Area Debrided (L x W): otal 0.8 (cm) x 1.2 (cm) = 0.96 (cm) Tissue and other material debrided: Viable, Non-Viable, Callus, Subcutaneous Level: Skin/Subcutaneous Tissue Debridement Description: Excisional Instrument: Curette Bleeding: Minimum Hemostasis Achieved: Pressure End Time: 10:28 Procedural Pain: 0 Post Procedural Pain: 0 Response to Treatment: Procedure was tolerated well Level of Consciousness (Post- Awake and Alert procedure): Post Debridement Measurements of Total Wound Length: (cm) 0.8 Width: (cm) 1.2 Depth: (cm) 0.5 Volume: (cm) 0.377 Character of Wound/Ulcer Post Debridement: Improved Severity of Tissue Post Debridement: Fat layer exposed Post Procedure Diagnosis Same as Pre-procedure Electronic Signature(s) Signed: 02/15/2020 6:57:29 PM By: Linton Ham MD Signed: 02/25/2020 5:50:16 PM By: Carlene Coria RN Entered By: Linton Ham on 02/15/2020 11:07:42 -------------------------------------------------------------------------------- Debridement Details Patient Name: Date of Service: FA IN, Masaye L. 02/15/2020 9:45 A M Medical Record Number: [id number] Patient  Account Number: [id number] Date of Birth/Sex: Treating RN: 19-Feb-1956 (64 y.o. Female) Carlene Coria Primary Care Provider: [employer] Other Clinician: Referring Provider: Treating Provider/Extender: Garfield Cornea in Treatment: 6 Debridement Performed for Assessment: Wound #2 Right T Great oe Performed By: Physician Ricard Dillon., MD Debridement Type: Debridement Severity of Tissue Pre Debridement: Fat layer exposed Level of Consciousness (Pre-procedure): Awake and Alert Pre-procedure Verification/Time Out Yes - 10:26 Taken: Start Time: 10:26 Pain Control: Other : Benzocaine 20% T Area Debrided (L x W): otal 0.3 (cm) x 0.2 (cm) = 0.06 (cm) Tissue and other material debrided: Viable, Non-Viable, Callus, Subcutaneous Level: Skin/Subcutaneous Tissue Debridement Description: Excisional Instrument: Curette Bleeding: Minimum Hemostasis Achieved: Pressure End Time: 10:28 Procedural Pain: 0 Post Procedural Pain: 0 Response to Treatment: Procedure was tolerated well Level of Consciousness (Post- Awake and Alert procedure): Post Debridement Measurements of Total Wound Length: (cm) 0.3 Width: (cm) 0.2 Depth: (cm) 0.2 Volume: (cm) 0.009 Character of Wound/Ulcer Post Debridement: Improved Severity of Tissue Post Debridement: Fat layer exposed Post Procedure Diagnosis Same as Pre-procedure Electronic Signature(s) Signed: 02/15/2020 6:57:29 PM By: Linton Ham MD Signed: 02/25/2020 5:50:16 PM By: Carlene Coria RN Entered By: Linton Ham on 02/15/2020 11:07:52 -------------------------------------------------------------------------------- HPI Details Patient Name: Date of Service: FA IN, Aliscia L. 02/15/2020 9:45 A M Medical Record Number: [id number] Patient Account Number: [id number] Date of Birth/Sex: Treating RN: 11-07-1955 (64 y.o. Female) Carlene Coria Primary Care Provider: [employer] Other Clinician: Referring Provider: Treating  Provider/Extender: Garfield Cornea in Treatment: 6 History of Present Illness HPI Description: ADMISSION 12/31/2019 This is a 64 year old woman with type 2 diabetes. She has been dealing with an area on her tip of her right great toe and the outside of her left foot since mid April. She has been following with Dr. Marcheta Grammes podiatry in [place]. She has recently moved to [place]. She has been using Silvadene cream to the wound. She has a AFO brace brace to try and straighten her  left ankle but the patient states it hurts and she sometimes does not use it. She tries to be active including going to the store etc. etc. She also has significant neuropathy in both feet. Past medical history includes Karlene Lineman, cataracts, type 2 diabetes with neuropathy, history of Graves' disease now hypothyroid, autoimmune hemolytic anemia, quit smoking 10 years ago, hypertension Arterial studies; done this year she had ABI in the right of 0.94 TBI of 0.93. On the left 0.95 and a TBI of 0.67 7/9; this patient has an area in the tip of her right great toe and outside of her left foot at roughly the base of the fifth metatarsal. We applied PolyMem to both of these wounds last week. The toe is improved however the area on the left lateral foot is not. She tells me she does not wear the brace when she is in her home but she is trying to stay off the wound is much as possible by not being on her foot 7/26; the patient arrives with thick callus around the wound on the left lateral foot and thick tissue around the area on her right great toe. Both of these require debridement. We have been using polymen Ag 8/17; no real improvement in either wound area which is on the base of the fifth metatarsal laterally on the left and on the plantar right first toe tip. She walks with her left foot inverted at the ankle. She has an AFO brace but I do not think this is working all that well. She is concerned about areas  of erythema on the left lateral calf there are actually 3 of them. She does not feel pain but notes that she is insensate with her neuropathy from her diabetes. We have been using polymen Ag but making no progress Electronic Signature(s) Signed: 02/15/2020 6:57:29 PM By: Linton Ham MD Entered By: Linton Ham on 02/15/2020 11:09:26 -------------------------------------------------------------------------------- Physical Exam Details Patient Name: Date of Service: FA IN, [place]ah L. 02/15/2020 9:45 A M Medical Record Number: [id number] Patient Account Number: [id number] Date of Birth/Sex: Treating RN: 03-20-1956 (64 y.o. Female) Carlene Coria Primary Care Provider: [employer] Other Clinician: Referring Provider: Treating Provider/Extender: Garfield Cornea in Treatment: 6 Constitutional Patient is hypertensive.. Pulse regular and within target range for patient.Marland Kitchen Respirations regular, non-labored and within target range.. Temperature is normal and within the target range for the patient.Marland Kitchen Appears in no distress. Respiratory work of breathing is normal. Cardiovascular Pedal pulses are palpable on both feet. Musculoskeletal Inversion and contracture of the left ankle complicates any attempt to heal this wound. Notes Wound exam On the left lateral foot base of the fifth metatarsal. Still a necroti wound with thick surrounding callus skin and subcutaneous tissue around the wound we have not made any progress here. 2. Similarly on the tip of her right great toe thick callus small linear wound 3. Both areas require debridement. Simply no way to heal the wound with surrounding callus skin and subcutaneous tissue and debris on the wound surface Electronic Signature(s) Signed: 02/15/2020 6:57:29 PM By: Linton Ham MD Entered By: Linton Ham on 02/15/2020 11:12:20 -------------------------------------------------------------------------------- Physician Orders  Details Patient Name: Date of Service: FA IN, Glender L. 02/15/2020 9:45 A M Medical Record Number: [id number] Patient Account Number: [id number] Date of Birth/Sex: Treating RN: 1955-12-18 (64 y.o. 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 Dressing Change Frequency Wound #1 Left,Lateral Foot Change Dressing every other day. Wound #2 Right T Great oe Change Dressing every other day. Wound Cleansing May shower and wash wound with soap and water. - when dressing is changed Primary Wound Dressing Wound #1 Left,Lateral Foot Silver Collagen - moisten with hydrogel or KY jelly Wound #2 Right T Great oe Silver Collagen - moisten with hydrogel or KY jelly Secondary Dressing Wound #1 Left,Lateral Foot Foam - foam donut Kerlix/Rolled Gauze Dry Gauze Wound #2 Right T Great oe Kerlix/Rolled Gauze Dry Gauze Electronic Signature(s) Signed: 02/15/2020 6:57:29 PM By: Linton Ham MD Signed: 02/17/2020 5:36:57 PM By: Levan Hurst RN, BSN Entered By: Levan Hurst on 02/15/2020 10:34:07 -------------------------------------------------------------------------------- Problem List Details Patient Name: Date of Service: FA IN, Miya L. 02/15/2020 9:45 A M Medical Record Number: [id number] Patient Account Number: [id number] Date of Birth/Sex: Treating RN: 1955-10-21 (64 y.o. Female)  Levan Hurst Primary Care Provider: [employer] Other Clinician: Referring Provider: Treating Provider/Extender: Garfield Cornea in Treatment: 6 Active Problems ICD-10 Encounter Code Description Active Date MDM Diagnosis E11.621 Type 2 diabetes mellitus with foot ulcer 12/31/2019 No Yes L97.522 Non-pressure chronic ulcer of other part of left foot with fat layer exposed 12/31/2019 No Yes L97.511 Non-pressure chronic ulcer of other part of right foot limited to breakdown of 12/31/2019 No Yes skin E11.42 Type 2 diabetes mellitus with diabetic polyneuropathy 12/31/2019 No Yes M21.172 Varus deformity, not elsewhere classified, left ankle 12/31/2019 No Yes Inactive Problems Resolved Problems Electronic Signature(s) Signed: 02/15/2020 6:57:29 PM By: Linton Ham MD Entered By: Linton Ham on 02/15/2020 11:07:15 -------------------------------------------------------------------------------- Progress Note Details Patient Name: Date of Service: FA IN, Lenka L. 02/15/2020 9:45 A M Medical Record Number: [id number] Patient Account Number: [id number] Date of Birth/Sex: Treating RN: [birth year] (64 y.o. Female) Carlene Coria Primary Care Provider: [employer] Other Clinician: Referring Provider: Treating Provider/Extender: Garfield Cornea in Treatment: 6 Subjective History of Present Illness (HPI) ADMISSION 12/31/2019 This is a 64 year old woman with type 2 diabetes. She has been dealing with an area on her tip of her right great toe and the outside of her left foot since mid April. She has been following with Dr. Marcheta Grammes podiatry in [place]. She has recently moved to [place]. She has been using Silvadene cream to the wound. She has a AFO brace brace to try and straighten her left ankle but the patient states it hurts and she sometimes does not use it. She tries to be active including going to the store etc. etc. She also has significant  neuropathy in both feet. Past medical history includes Karlene Lineman, cataracts, type 2 diabetes with neuropathy, history of Graves' disease now hypothyroid, autoimmune hemolytic anemia, quit smoking 10 years ago, hypertension Arterial studies; done this year she had ABI in the right of 0.94 TBI of 0.93. On the left 0.95 and a TBI of 0.67 7/9; this patient has an area in the tip of her right great toe and outside of her left foot at roughly the base of the fifth metatarsal. We applied PolyMem to both of these wounds last week. The toe  is improved however the area on the left lateral foot is not. She tells me she does not wear the brace when she is in her home but she is trying to stay off the wound is much as possible by not being on her foot 7/26; the patient arrives with thick callus around the wound on the left lateral foot and thick tissue around the area on her right great toe. Both of these require debridement. We have been using polymen Ag 8/17; no real improvement in either wound area which is on the base of the fifth metatarsal laterally on the left and on the plantar right first toe tip. She walks with her left foot inverted at the ankle. She has an AFO brace but I do not think this is working all that well. She is concerned about areas of erythema on the left lateral calf there are actually 3 of them. She does not feel pain but notes that she is insensate with her neuropathy from her diabetes. We have been using polymen Ag but making no progress Objective Constitutional Patient is hypertensive.. Pulse regular and within target range for patient.Marland Kitchen Respirations regular, non-labored and within target range.. Temperature is normal and within the target range for the patient.Marland Kitchen Appears in no distress. Vitals Time Taken: 9:28 AM, Height: 63 in, Weight: 131 lbs, BMI: 23.2, Temperature: 97.9 F, Pulse: 90 bpm, Respiratory Rate: 18 breaths/min, Blood Pressure: 153/76 mmHg. Respiratory work of breathing  is normal. Cardiovascular Pedal pulses are palpable on both feet. Musculoskeletal Inversion and contracture of the left ankle complicates any attempt to heal this wound. General Notes: Wound exam ooOn the left lateral foot base of the fifth metatarsal. Still a necroti wound with thick surrounding callus skin and subcutaneous tissue around the wound we have not made any progress here. 2. Similarly on the tip of her right great toe thick callus small linear wound 3. Both areas require debridement. Simply no way to heal the wound with surrounding callus skin and subcutaneous tissue and debris on the wound surface Integumentary (Hair, Skin) Wound #1 status is Open. Original cause of wound was Gradually Appeared. The wound is located on the Left,Lateral Foot. The wound measures 0.8cm length x 1.2cm width x 0.5cm depth; 0.754cm^2 area and 0.377cm^3 volume. There is Fat Layer (Subcutaneous Tissue) Exposed exposed. There is no tunneling or undermining noted. There is a medium amount of serosanguineous drainage noted. The wound margin is well defined and not attached to the wound base. There is medium (34-66%) red granulation within the wound bed. There is a medium (34-66%) amount of necrotic tissue within the wound bed including Adherent Slough. Wound #2 status is Open. Original cause of wound was Gradually Appeared. The wound is located on the Right T Great. The wound measures 0.3cm length x oe 0.2cm width x 0.2cm depth; 0.047cm^2 area and 0.009cm^3 volume. There is Fat Layer (Subcutaneous Tissue) Exposed exposed. There is a medium amount of serosanguineous drainage noted. The wound margin is distinct with the outline attached to the wound base. There is large (67-100%) red granulation within the wound bed. There is no necrotic tissue within the wound bed. 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 There was a Excisional Skin/Subcutaneous Tissue Debridement with a total area of 0.96 sq cm performed by Ricard Dillon., MD. With the following instrument(s): Curette to remove Viable and Non-Viable tissue/material. Material removed includes Callus and Subcutaneous Tissue and after achieving pain control using Other (Benzocaine 20%). No specimens were taken. A time out was conducted at 10:26, prior to the start of the procedure. A Minimum amount of bleeding was controlled with Pressure. The procedure was tolerated well with a pain level of 0 throughout and a pain level of 0 following the procedure. Post Debridement Measurements: 0.8cm length x 1.2cm width x 0.5cm depth; 0.377cm^3 volume. Character of Wound/Ulcer Post Debridement is improved. Severity of Tissue Post Debridement is: Fat layer exposed. Post procedure Diagnosis Wound #1: Same as Pre-Procedure Wound #2 Pre-procedure diagnosis of Wound #2 is a Diabetic Wound/Ulcer of the Lower Extremity located on the Right T Great .Severity of Tissue Pre Debridement is: oe Fat layer exposed. There was a Excisional Skin/Subcutaneous Tissue Debridement with a total area of 0.06 sq cm performed by Ricard Dillon., MD. With the following instrument(s): Curette to remove Viable and Non-Viable tissue/material. Material removed includes Callus and Subcutaneous Tissue and after achieving pain control using Other (Benzocaine 20%). No specimens were taken. A time out was conducted at 10:26, prior to the start of the procedure. A Minimum amount of bleeding was  controlled with Pressure. The procedure was tolerated well with a pain level of 0 throughout and a pain level of 0 following the procedure. Post Debridement Measurements: 0.3cm length x 0.2cm width x 0.2cm depth; 0.009cm^3 volume. Character of Wound/Ulcer Post Debridement is improved. Severity of Tissue Post Debridement is: Fat layer exposed. Post procedure Diagnosis Wound #2: Same as Pre-Procedure Plan Follow-up Appointments: Return Appointment in 1 week. Dressing Change Frequency: Wound #1 Left,Lateral Foot: Change Dressing every other day. Wound #2 Right T Great: oe Change Dressing every other day. Wound Cleansing: May shower and wash wound with soap and water. - when dressing is changed Primary Wound Dressing: Wound #1 Left,Lateral Foot: Silver Collagen - moisten with hydrogel or KY jelly Wound #2 Right T Great: oe Silver Collagen - moisten with hydrogel or KY jelly Secondary Dressing: Wound #1 Left,Lateral Foot: Foam - foam donut Kerlix/Rolled Gauze Dry Gauze Wound #2 Right T Great: oe Kerlix/Rolled Gauze Dry Gauze #1 I change the primary dressings this moistened silver collagen on both sides 2. Although the patient states she walks minimally, the callus and condition of the wound margins on both side would suggest otherwise although with the amount of force on her left lateral foot all of this centered over the area of the wound even minimal walking and pressure is probably more than this wound can handle 3. I have again talked about a total contact cast and although I am concerned about this not just from a wound issue but from a pressure area on other prominences I think this is where this is going to go. I know of no other way to see if she would handle it other than to try it and I am probably a week or 2 way from suggesting this. 4. Both areas required debridement. We do not have an adequate wound surface or circumference to support healing. I change the dressing to  silver collagen to see if we can stimulate some granulation. She is changing  the dressing. I am hopeful that in 2 or 3 weeks mechanical debridement will stop Electronic Signature(s) Signed: 02/15/2020 6:57:29 PM By: Linton Ham MD Entered By: Linton Ham on 02/15/2020 11:15:00 -------------------------------------------------------------------------------- SuperBill Details Patient Name: Date of Service: FA IN, Shterna L. 02/15/2020 Medical Record Number: [id number] Patient Account Number: [id number] Date of Birth/Sex: Treating RN: [birth year] (64 y.o. 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, not elsewhere classified, left ankle Facility Procedures CPT4 Code: [id number] Description: 43154 - DEB SUBQ TISSUE 20 SQ CM/< ICD-10 Diagnosis Description L97.511 Non-pressure chronic ulcer of other part of right foot limited to breakdown of L97.522 Non-pressure chronic ulcer of other part of left foot with fat layer exposed Modifier: skin Quantity: 1 Physician Procedures : CPT4 Code Description Modifier [id number] - WC PHYS SUBQ TISS 20 SQ CM ICD-10 Diagnosis Description L97.511 Non-pressure chronic ulcer of other part of right foot limited to breakdown of skin L97.522 Non-pressure chronic ulcer of other part of left  foot with fat layer exposed Quantity: 1 Electronic Signature(s) Signed: 02/15/2020 6:57:29 PM By: Linton Ham MD Entered By: Linton Ham on 02/15/2020 11:15:18

## 2020-02-26 NOTE — ED Triage Notes (Signed)
Pt is here for evaluation of a left foot ulcer. Pt was last seen at the wound clinic on 02/22/20. Pt states the wound is getting worse.

## 2020-02-26 NOTE — H&P (Signed)
History and Physical  Jessica Dunlap KYH:[id number] DOB: 1955/09/14 DOA: 02/26/2020  Referring physician: Dr Sabra Heck, ED physician PCP: Redmond School, MD  Outpatient Specialists:   Patient Coming From:   Chief Complaint: foot ulcer  HPI: Jessica Dunlap is a 64 y.o. female with a history of Diabetes type 2 with neuropathy, hypertension, hypothyroidism, depression, h/o cervical cancer. Has been followed for chronic foot ulceration by wound care.  20, the patient had increased in erythema and odor on the wound on her left foot.  This is progressed over the past week with erythema extending up her left calf.  There is no pain, but it is continued to drain.  No palliating or provoking factors.  Additionally, her blood sugars have been high despite taking her medicines.  Emergency Department Course: Started on Zosyn.  Blood cultures obtained.  White count 16.  Patient in DKA and insulin drip started  Review of Systems:   Pt denies any fevers, chills, nausea, vomiting, diarrhea, constipation, abdominal pain, shortness of breath, dyspnea on exertion, orthopnea, cough, wheezing, palpitations, headache, vision changes, lightheadedness, dizziness, melena, rectal bleeding.  Review of systems are otherwise negative  Past Medical History:  Diagnosis Date  . Anemia   . Cancer (HCC)    cervical  . Cirrhosis, non-alcoholic ([place])   . Coronary artery disease   . Depression   . Diabetes mellitus without complication ([place])   . Hypertension   . Hypothyroidism   . Neuropathy   . Tachycardia   . Thyroid disease    Past Surgical History:  Procedure Laterality Date  . ABDOMINAL HYSTERECTOMY    . APPENDECTOMY    . CATARACT EXTRACTION W/PHACO Left 12/08/2017   Procedure: CATARACT EXTRACTION PHACO AND INTRAOCULAR LENS PLACEMENT LEFT EYE;  Surgeon: Tonny Branch, MD;  Location: [facility];  Service: Ophthalmology;  Laterality: Left;  CDE: 10.09  . CATARACT EXTRACTION W/PHACO Right 12/22/2017   Procedure: CATARACT  EXTRACTION PHACO AND INTRAOCULAR LENS PLACEMENT RIGHT EYE;  Surgeon: Tonny Branch, MD;  Location: [facility];  Service: Ophthalmology;  Laterality: Right;  CDE: 14.20   Social History:  reports that she quit smoking about 12 years ago. Her smoking use included cigarettes. She has a 45.00 pack-year smoking history. She has never used smokeless tobacco. She reports that she does not drink alcohol and does not use drugs. Patient lives at home  No Known Allergies  History reviewed. No pertinent family history.    Prior to Admission medications   Medication Sig Start Date End Date Taking? Authorizing Provider  amoxicillin-clavulanate (AUGMENTIN) 875-125 MG tablet Take 1 tablet by mouth 2 (two) times daily. 02/24/20  Yes [provider]  carbamazepine (TEGRETOL XR) 200 MG 12 hr tablet Take by mouth 2 (two) times daily. 11/22/19  Yes [provider]  DULoxetine (CYMBALTA) 30 MG capsule Take 1 capsule by mouth daily. 02/02/20  Yes [provider]  furosemide (LASIX) 20 MG tablet Take 20 mg by mouth daily. 02/02/20  Yes [provider]  morphine (MS CONTIN) 30 MG 12 hr tablet Take 30 mg by mouth daily. 01/31/20  Yes [provider]  alendronate (FOSAMAX) 70 MG tablet Take 70 mg by mouth once a week. Take with a full glass of water on an empty stomach.    [provider]  anti-nausea (EMETROL) solution Take 10 mLs by mouth as needed for nausea or vomiting.    [provider]  aspirin EC 81 MG tablet Take 81 mg by mouth daily.  [provider]  canagliflozin (INVOKANA) 100 MG TABS tablet Take 100 mg by mouth daily.    [provider]  diltiazem (TIAZAC) 120 MG 24 hr capsule Take 120 mg by mouth daily. 10/31/17   [provider]  doxycycline (VIBRA-TABS) 100 MG tablet Take 1 tablet (100 mg total) by mouth 2 (two) times daily. 02/25/18   Francine Graven, DO  DULoxetine (CYMBALTA) 60 MG capsule Take 60 mg by mouth at bedtime.     [provider]  glipiZIDE (GLUCOTROL) 10 MG tablet Take 10 mg by mouth daily.    [provider]  HYDROmorphone (DILAUDID) 8 MG tablet Take 16 mg by mouth 3 (three) times daily as needed for moderate pain.     [provider]  ibuprofen (ADVIL,MOTRIN) 200 MG tablet Take 400 mg by mouth daily as needed for headache or moderate pain.    [provider]  levothyroxine (SYNTHROID, LEVOTHROID) 200 MCG tablet Take 200 mcg by mouth at bedtime. Take one daily along with 74mcg tablets for total dose of 225 mcg.    [provider]  levothyroxine (SYNTHROID, LEVOTHROID) 25 MCG tablet Take 25 mcg by mouth at bedtime. Take one tablet along with 242mcg tablet for total dose of 229mcg.    [provider]  metFORMIN (GLUCOPHAGE) 500 MG tablet Take 1 tablet (500 mg total) by mouth 2 (two) times daily with a meal. Patient taking differently: Take 1,000 mg by mouth 2 (two) times daily with a meal.  03/13/12 11/28/17  Nat Christen, MD  metoprolol (LOPRESSOR) 50 MG tablet Take 50 mg by mouth 2 (two) times daily.    [provider]  Polyethyl Glycol-Propyl Glycol (SYSTANE OP) Place 1 drop into both eyes 3 (three) times daily as needed (dry eyes).    [provider]  spironolactone (ALDACTONE) 25 MG tablet Take 50 mg by mouth daily.    [provider]  traZODone (DESYREL) 150 MG tablet Take 150 mg by mouth at bedtime. For sleep    [provider]  vitamin E 400 UNIT capsule Take 400 Units by mouth 2 (two) times daily.    [provider]    Physical Exam: BP (!) 144/63   Pulse 97   Temp 98.7 F (37.1 C) (Oral)   Resp 15   Ht 5\' 3"  (1.6 m)   Wt 59.4 kg   SpO2 95%   BMI 23.21 kg/m   . General: Elderly adult female. Awake and alert and oriented x3. No acute cardiopulmonary distress.  Marland Kitchen HEENT: Normocephalic atraumatic.  Right and left ears normal in appearance.  Pupils equal, round, reactive to light. Extraocular muscles  are intact. Sclerae anicteric and noninjected.  Moist mucosal membranes. No mucosal lesions.  . Neck: Neck supple without lymphadenopathy. No carotid bruits. No masses palpated.  . Cardiovascular: Regular rate with normal S1-S2 sounds. No murmurs, rubs, gallops auscultated. No JVD.  Marland Kitchen Respiratory: Good respiratory effort with no wheezes, rales, rhonchi. Lungs clear to auscultation bilaterally.  No accessory muscle use. . Abdomen: Soft, nontender, nondistended. Active bowel sounds. No masses or hepatosplenomegaly  . Skin: 2 cm ulcerated area with surrounding erythema on the left lateral foot.  There is extension of the erythemic area of the left lateral side of her lower extremity.  Dry, warm to touch. 2+ dorsalis pedis and radial pulses. . Musculoskeletal: No calf or leg pain. All major joints not erythematous nontender.  No upper or lower joint deformation.  Good ROM.  No contractures  .  Psychiatric: Intact judgment and insight. Pleasant and cooperative. . Neurologic: No focal neurological deficits. Strength is 5/5 and symmetric in upper and lower extremities.  Cranial nerves II through XII are grossly intact.           Labs on Admission: I have personally reviewed following labs and imaging studies  CBC: Recent Labs  Lab 02/26/20 1725  WBC 16.0*  NEUTROABS 14.4*  HGB 11.4*  HCT 32.8*  MCV 88.9  PLT 673   Basic Metabolic Panel: Recent Labs  Lab 02/26/20 1725  NA 128*  K 3.6  CL 91*  CO2 21*  GLUCOSE 367*  BUN 13  CREATININE 0.83  CALCIUM 8.9   GFR: Estimated Creatinine Clearance: 57.4 mL/min (by C-G formula based on SCr of 0.83 mg/dL). Liver Function Tests: Recent Labs  Lab 02/26/20 1725  AST 14*  ALT 11  ALKPHOS 61  BILITOT 1.1  PROT 8.3*  ALBUMIN 3.9   No results for input(s): LIPASE, AMYLASE in the last 168 hours. No results for input(s): AMMONIA in the last 168 hours. Coagulation Profile: No results for input(s): INR, PROTIME in the last 168 hours. Cardiac  Enzymes: No results for input(s): CKTOTAL, CKMB, CKMBINDEX, TROPONINI in the last 168 hours. BNP (last 3 results) No results for input(s): PROBNP in the last 8760 hours. HbA1C: No results for input(s): HGBA1C in the last 72 hours. CBG: Recent Labs  Lab 02/26/20 1647  GLUCAP 359*   Lipid Profile: No results for input(s): CHOL, HDL, LDLCALC, TRIG, CHOLHDL, LDLDIRECT in the last 72 hours. Thyroid Function Tests: No results for input(s): TSH, T4TOTAL, FREET4, T3FREE, THYROIDAB in the last 72 hours. Anemia Panel: No results for input(s): VITAMINB12, FOLATE, FERRITIN, TIBC, IRON, RETICCTPCT in the last 72 hours. Urine analysis:    Component Value Date/Time   COLORURINE YELLOW 09/04/2010 1852   APPEARANCEUR CLEAR 09/04/2010 1852   LABSPEC 1.025 09/04/2010 1852   PHURINE 5.0 09/04/2010 1852   GLUCOSEU NEGATIVE 09/04/2010 1852   HGBUR TRACE (A) 09/04/2010 1852   BILIRUBINUR NEGATIVE 09/04/2010 1852   KETONESUR NEGATIVE 09/04/2010 1852   PROTEINUR 100 (A) 09/04/2010 1852   UROBILINOGEN 0.2 09/04/2010 1852   NITRITE NEGATIVE 09/04/2010 1852   LEUKOCYTESUR SMALL (A) 09/04/2010 1852   Sepsis Labs: @LABRCNTIP (procalcitonin:4,lacticidven:4) ) Recent Results (from the past 240 hour(s))  Aerobic Culture (superficial specimen)     Status: None   Collection Time: 02/18/20 12:30 PM   Specimen: Wound  Result Value Ref Range Status   Specimen Description   Final    WOUND LEFT LATERAL FOOT Performed at [facility], [place] [address] [place], [place] [address]    Special Requests   Final    NONE Performed at [facility], [place] [address] [place], [place] [address]    Gram Stain   Final    MODERATE WBC PRESENT, PREDOMINANTLY PMN ABUNDANT GRAM NEGATIVE COCCI ABUNDANT GRAM NEGATIVE RODS FEW GRAM POSITIVE RODS    Culture   Final    ABUNDANT GROUP B STREP(S.AGALACTIAE)ISOLATED TESTING AGAINST S. AGALACTIAE NOT ROUTINELY PERFORMED DUE TO  PREDICTABILITY OF AMP/PEN/VAN SUSCEPTIBILITY. ABUNDANT STREPTOCOCCUS ANGINOSIS CULTURE REINCUBATED FOR BETTER GROWTH Performed at [facility], [place] [address] [place], [place] [address]    Report Status 02/23/2020 FINAL  Final   Organism ID, Bacteria STREPTOCOCCUS ANGINOSIS  Final      Susceptibility   Streptococcus anginosis - MIC*    PENICILLIN <=0.06 SENSITIVE Sensitive     CEFTRIAXONE 0.25 SENSITIVE Sensitive  ERYTHROMYCIN <=0.12 SENSITIVE Sensitive     LEVOFLOXACIN 1 SENSITIVE Sensitive     VANCOMYCIN 0.5 SENSITIVE Sensitive     * ABUNDANT STREPTOCOCCUS ANGINOSIS     Radiological Exams on Admission: DG Tibia/Fibula Left  Result Date: 02/26/2020 CLINICAL DATA:  Left lower extremity redness. EXAM: LEFT TIBIA AND FIBULA - 2 VIEW COMPARISON:  None. FINDINGS: There is no evidence of fracture or other focal bone lesions. Mild soft tissue swelling is seen along the anterior aspect of the distal left tibia. This is seen on the lateral view. IMPRESSION: Mild soft tissue swelling without evidence of an acute osseous abnormality. Electronically Signed   By: Virgina Norfolk M.D.   On: 02/26/2020 17:33   DG Ankle Complete Left  Result Date: 02/26/2020 CLINICAL DATA:  Left foot ulcer with left tib/fib redness. EXAM: LEFT ANKLE COMPLETE - 3+ VIEW COMPARISON:  None. FINDINGS: There is no evidence of fracture, dislocation, or joint effusion. Mild degenerative changes seen along the dorsal aspect of the mid left foot. Moderate severity soft tissue swelling is noted along the medial aspect of the left ankle. Moderate severity soft tissue swelling of the dorsal aspect of the mid left foot is also noted. IMPRESSION: 1. Moderate severity medial soft tissue swelling without evidence of acute fracture. 2. Mild degenerative changes without evidence of an acute osseous abnormality. Electronically Signed   By: Virgina Norfolk M.D.   On: 02/26/2020 17:35   DG Foot Complete Left  Result Date:  02/26/2020 CLINICAL DATA:  Left foot ulcer. EXAM: LEFT FOOT - COMPLETE 3+ VIEW COMPARISON:  None. FINDINGS: There is no evidence of an acute fracture or dislocation. Mild degenerative changes are seen along the dorsal aspect of the mid left foot. There is moderate severity soft tissue swelling of the dorsal aspect of the distal left foot. A 1.3 cm x 0.7 cm superficial soft tissue defect is seen along the lateral aspect of the mid left foot. This is adjacent to the base of the fifth left metatarsal. Mild-to-moderate severity focal soft tissue swelling is also seen within this region. IMPRESSION: 1. Soft tissue ulceration along the lateral aspect of the mid left foot with associated soft tissue swelling. 2. No acute fracture or acute osteomyelitis. 3. Mild degenerative changes, as described above. Electronically Signed   By: Virgina Norfolk M.D.   On: 02/26/2020 17:37   DG Toe Great Right  Result Date: 02/26/2020 CLINICAL DATA:  Left foot ulcer. EXAM: RIGHT GREAT TOE COMPARISON:  None. FINDINGS: There is no evidence of an acute fracture or dislocation. A chronic appearing deformity is seen involving the tuft of the distal phalanx of the right great toe. Moderate severity diffuse soft tissue swelling is noted. IMPRESSION: Soft tissue swelling without evidence of acute osteomyelitis. MRI correlation is recommended if this remains of clinical concern. Electronically Signed   By: Virgina Norfolk M.D.   On: 02/26/2020 17:32    Assessment/Plan: Principal Problem:   DKA, type 2 ([place]) Active Problems:   Uncontrolled type 2 diabetes with neuropathy ([place])   Hypothyroidism   Hypertension   Coronary artery disease   Cirrhosis, non-alcoholic ([place])   Diabetic foot ulcer ([place])    This patient was discussed with the ED physician, including pertinent vitals, physical exam findings, labs, and imaging.  We also discussed care given by the ED provider.  1. DKA with uncontrolled type 2 diabetes and  neuropathy admit to stepdown Telemetry monitoring Continue aggressive IV hydration CBGs every hour Basic metabolic panel every 4  hours Potassium replacement: 10 mEq IV runs 2 Noncaloric liquids Transition to D5 normal saline at 150 mL's per hour with CBG at 250 Transition to home insulin regimen when gap is closed and acidosis resolved. 2. Diabetic foot ulcer a. Change antibiotics to ceftriaxone and flagyl b. ABI c. Wound care consult. d. General surgery consult e. Blood cultures f. CBC in AM g. Will likely need MRI 3. Hypothyroidism a. Continue synthroid 4. Hypertension a. Holding antihypertensives currently 5. Coronary artery disease 6. Nonalcoholic cirrhosis  DVT prophylaxis: Lovenox Consultants:  Code Status: Full code Family Communication: none  Disposition Plan: should be able to return home   Truett Mainland, DO

## 2020-02-27 ENCOUNTER — Inpatient Hospital Stay (HOSPITAL_COMMUNITY): Payer: 59

## 2020-02-27 DIAGNOSIS — M14672 Charcot's joint, left ankle and foot: Secondary | ICD-10-CM

## 2020-02-27 DIAGNOSIS — E11628 Type 2 diabetes mellitus with other skin complications: Secondary | ICD-10-CM

## 2020-02-27 DIAGNOSIS — E08621 Diabetes mellitus due to underlying condition with foot ulcer: Secondary | ICD-10-CM | POA: Diagnosis present

## 2020-02-27 DIAGNOSIS — L089 Local infection of the skin and subcutaneous tissue, unspecified: Secondary | ICD-10-CM

## 2020-02-27 DIAGNOSIS — I96 Gangrene, not elsewhere classified: Secondary | ICD-10-CM

## 2020-02-27 DIAGNOSIS — E1110 Type 2 diabetes mellitus with ketoacidosis without coma: Secondary | ICD-10-CM | POA: Diagnosis present

## 2020-02-27 LAB — BASIC METABOLIC PANEL
Anion gap: 10 (ref 5–15)
Anion gap: 11 (ref 5–15)
BUN: 10 mg/dL (ref 8–23)
BUN: 9 mg/dL (ref 8–23)
CO2: 24 mmol/L (ref 22–32)
CO2: 21 mmol/L — ABNORMAL LOW (ref 22–32)
Calcium: 8.2 mg/dL — ABNORMAL LOW (ref 8.9–10.3)
Calcium: 8 mg/dL — ABNORMAL LOW (ref 8.9–10.3)
Chloride: 101 mmol/L (ref 98–111)
Chloride: 101 mmol/L (ref 98–111)
Creatinine, Ser: 0.72 mg/dL (ref 0.44–1.00)
Creatinine, Ser: 0.62 mg/dL (ref 0.44–1.00)
GFR calc Af Amer: >60 mL/min (ref >60)
GFR calc Af Amer: >60 mL/min (ref >60)
GFR calc non Af Amer: >60 mL/min (ref >60)
GFR calc non Af Amer: >60 mL/min (ref >60)
Glucose, Bld: 162 mg/dL — ABNORMAL HIGH (ref 70–99)
Glucose, Bld: 252 mg/dL — ABNORMAL HIGH (ref 70–99)
Potassium: 3.4 mmol/L — ABNORMAL LOW (ref 3.5–5.1)
Potassium: 3.9 mmol/L (ref 3.5–5.1)
Sodium: 135 mmol/L (ref 135–145)
Sodium: 133 mmol/L — ABNORMAL LOW (ref 135–145)

## 2020-02-27 LAB — SEDIMENTATION RATE: Sed Rate: 140 mm/hr — ABNORMAL HIGH (ref 0–22)

## 2020-02-27 LAB — CBG MONITORING, ED
Glucose-Capillary: 156 mg/dL — ABNORMAL HIGH (ref 70–99)
Glucose-Capillary: 188 mg/dL — ABNORMAL HIGH (ref 70–99)
Glucose-Capillary: 243 mg/dL — ABNORMAL HIGH (ref 70–99)
Glucose-Capillary: 222 mg/dL — ABNORMAL HIGH (ref 70–99)

## 2020-02-27 LAB — HEMOGLOBIN A1C
Hgb A1c MFr Bld: 6.7 % — ABNORMAL HIGH (ref 4.8–5.6)
Hgb A1c MFr Bld: 6.7 % — ABNORMAL HIGH (ref 4.8–5.6)
Mean Plasma Glucose: 145.59 mg/dL
Mean Plasma Glucose: 145.59 mg/dL

## 2020-02-27 LAB — PREALBUMIN: Prealbumin: 9.5 mg/dL — ABNORMAL LOW (ref 18–38)

## 2020-02-27 LAB — GLUCOSE, CAPILLARY
Glucose-Capillary: 220 mg/dL — ABNORMAL HIGH (ref 70–99)
Glucose-Capillary: 259 mg/dL — ABNORMAL HIGH (ref 70–99)

## 2020-02-27 LAB — HIV ANTIBODY (ROUTINE TESTING W REFLEX): HIV Screen 4th Generation wRfx: NONREACTIVE

## 2020-02-27 LAB — C-REACTIVE PROTEIN: CRP: 21.6 mg/dL — ABNORMAL HIGH (ref <1.0)

## 2020-02-27 LAB — BETA-HYDROXYBUTYRIC ACID: Beta-Hydroxybutyric Acid: 0.1 mmol/L (ref 0.05–0.27)

## 2020-02-27 MED ORDER — SODIUM CHLORIDE 0.9 % IV SOLN: INTRAVENOUS | Status: DC

## 2020-02-27 MED ORDER — INSULIN GLARGINE 100 UNIT/ML ~~LOC~~ SOLN
12.0000 [IU] | Freq: Every day | SUBCUTANEOUS | Status: DC
  Administered 2020-02-27: 12 [IU] via SUBCUTANEOUS
  Filled 2020-02-27 (×2): qty 0.12

## 2020-02-27 MED ORDER — GADOBUTROL 1 MMOL/ML IV SOLN
6.0000 mL | Freq: Once | INTRAVENOUS | Status: AC | PRN
  Administered 2020-02-27: 6 mL via INTRAVENOUS

## 2020-02-27 MED ORDER — HYDROMORPHONE HCL 2 MG PO TABS
24.0000 mg | ORAL_TABLET | Freq: Every day | ORAL | Status: DC
  Administered 2020-02-28 – 2020-03-13 (×14): 24 mg via ORAL
  Filled 2020-02-27 (×14): qty 12

## 2020-02-27 MED ORDER — POTASSIUM CHLORIDE CRYS ER 20 MEQ PO TBCR
20.0000 meq | EXTENDED_RELEASE_TABLET | Freq: Once | ORAL | Status: AC
  Administered 2020-02-27: 20 meq via ORAL
  Filled 2020-02-27: qty 1

## 2020-02-27 MED ORDER — INSULIN ASPART 100 UNIT/ML ~~LOC~~ SOLN
0.0000 [IU] | Freq: Three times a day (TID) | SUBCUTANEOUS | Status: DC
  Administered 2020-02-27 (×3): 5 [IU] via SUBCUTANEOUS
  Administered 2020-02-28: 8 [IU] via SUBCUTANEOUS
  Filled 2020-02-27 (×2): qty 1

## 2020-02-27 MED ORDER — HYDROMORPHONE HCL 2 MG PO TABS
16.0000 mg | ORAL_TABLET | Freq: Two times a day (BID) | ORAL | Status: DC
  Administered 2020-02-27 – 2020-03-13 (×31): 16 mg via ORAL
  Filled 2020-02-27 (×31): qty 8

## 2020-02-27 MED ORDER — INSULIN GLARGINE 100 UNIT/ML ~~LOC~~ SOLN
6.0000 [IU] | Freq: Every day | SUBCUTANEOUS | Status: DC
  Administered 2020-02-27: 6 [IU] via SUBCUTANEOUS
  Filled 2020-02-27 (×3): qty 0.06

## 2020-02-27 NOTE — ED Notes (Signed)
Dr Olevia Bowens notified of pt trending blood sugars, endo tool instructing to switch to SQ insulin, IV insulin drip D/C'd at this time.

## 2020-02-27 NOTE — Progress Notes (Addendum)
Patient Demographics:    Jessica Dunlap, is a 64 y.o. female, DOB - [birth year], WHQ:[id number]  Admit date - 02/26/2020   Admitting Physician Temple Sporer Denton Brick, MD  Outpatient Primary MD for the patient is Redmond School, MD  LOS - 1   Chief Complaint  Patient presents with  . Foot Ulcer        Subjective:    Ronee Ranganathan today has no fevers, no emesis,  No chest pain,   -Left foot with some drainage  Assessment  & Plan :    Principal Problem:   DKA, type 2 ([place]) Active Problems:   Uncontrolled type 2 diabetes with neuropathy ([place])   Hypothyroidism   Hypertension   Coronary artery disease   Cirrhosis, non-alcoholic ([place])   Diabetic foot ulcer ([place])   DKA (diabetic ketoacidoses) ([place])  Brief Summary:- 64 y.o. female with a history of Diabetes type 2 with neuropathy, hypertension, hypothyroidism, depression, h/o cervical cancer admitted on 02/26/2020 with left foot infected diabetic ulcer requiring IV antibiotics and DKA requiring IV insulin -Phone conference on 02/27/20 with Dr. Donzetta Matters from vascular surgery and Dr. Constance Haw from general surgery--after reviewing patient's ABI, and wound images--decision made to transfer patient to [facility] for possible angiogram and further vascular surgery evaluation--to see if she is a candidate for revascularization -Patient will need general surgery and possibly Dr. Jess Barters consult at [facility] after vascular surgery work-up is completed  A/p  1)DKA--DKA pathophysiology has resolved, patient was transitioned from IV insulin to subcu insulin -Maintain adequate hydration orally and IV -PTA patient was not on insulin at home, she was on Metformin and glipizide only lantus and Use Novolog/Humalog Sliding scale insulin with Accu-Cheks/Fingersticks as ordered   2)Lt Diabetic Foot infection--please see photos in epic, patient has been going to wound clinic for a  while, failed outpatient oral antibiotics (Augmentin) -Wound Cultures from 02/18/2020 with Group B strep Agalactiae -Continue IV Rocephin and Flagyl -Await input from general surgeon and Dr. Donzetta Matters --ABIs  of the lower extremity noted, elevated -Need MRI of the lower extremity  3)Hypothyroidism--- continue Levothyroxine  4)H/o CAD--- no chest pains, continue aspirin and metoprolol 50 twice daily  5)NASH--- check fasting lipid profile  6)HTN--hold Aldactone and Lasix in view of DKA, continue metoprolol and Cardizem  7)Chronic pain Syndrome/diabetic polyneuropathy--continue Tegretol, Cymbalta and Opiates  Disposition/Need for in-Hospital Stay- patient unable to be discharged at this time due to -- left foot infected diabetic ulcer requiring IV antibiotics and DKA requiring IV insulin  Status is: Inpatient  Remains inpatient appropriate because:left foot infected diabetic ulcer requiring IV antibiotics and DKA requiring IV insulin   Disposition: The patient is from: Home              Anticipated d/c is to: Home              Anticipated d/c date is: 2 days              Patient currently is not medically stable to d/c. Barriers: Not Clinically Stable- left foot infected diabetic ulcer requiring IV antibiotics and DKA requiring IV insulin  Code Status : full code  Family Communication:    (patient is alert, awake and coherent)  Consults  :  gen surg  DVT Prophylaxis  :  Lovenox -  - SCDs  Lab Results  Component Value Date   PLT 222 02/26/2020    Inpatient Medications  Scheduled Meds: . aspirin EC  81 mg Oral Daily  . carbamazepine  100 mg Oral BID  . diltiazem  120 mg Oral Daily  . DULoxetine  30 mg Oral QHS  . DULoxetine  60 mg Oral QHS  . enoxaparin (LOVENOX) injection  40 mg Subcutaneous Q24H  . insulin aspart  0-15 Units Subcutaneous TID WC  . insulin glargine  12 Units Subcutaneous QHS  . levothyroxine  25 mcg Oral Q0600  . metoprolol tartrate  50 mg Oral BID  .  morphine  30 mg Oral QHS  . traZODone  150 mg Oral QHS   Continuous Infusions: . sodium chloride 100 mL/hr at 02/27/20 0846  . cefTRIAXone (ROCEPHIN)  IV Stopped (02/26/20 2350)   And  . metronidazole Stopped (02/27/20 0831)  . dextrose 5% lactated ringers Stopped (02/27/20 0127)   PRN Meds:.anti-nausea, dextrose, dextrose, polyvinyl alcohol    Anti-infectives (From admission, onward)   Start     Dose/Rate Route Frequency Ordered Stop   02/26/20 2220  cefTRIAXone (ROCEPHIN) 2 g in sodium chloride 0.9 % 100 mL IVPB       "And" Linked Group Details   2 g 200 mL/hr over 30 Minutes Intravenous Every 24 hours 02/26/20 2220     02/26/20 2220  metroNIDAZOLE (FLAGYL) IVPB 500 mg       "And" Linked Group Details   500 mg 100 mL/hr over 60 Minutes Intravenous Every 8 hours 02/26/20 2220     02/26/20 1715  piperacillin-tazobactam (ZOSYN) IVPB 3.375 g        3.375 g 100 mL/hr over 30 Minutes Intravenous  Once 02/26/20 1701 02/26/20 1809        Objective:   Vitals:   02/27/20 0900 02/27/20 0930 02/27/20 1030 02/27/20 1100  BP: 124/60 136/61 120/71 123/60  Pulse: (!) 107  (!) 108 (!) 107  Resp:      Temp:      TempSrc:      SpO2: 96%  96% 94%  Weight:      Height:        Wt Readings from Last 3 Encounters:  02/26/20 59.4 kg  04/07/18 60.3 kg  02/25/18 62.6 kg     Intake/Output Summary (Last 24 hours) at 02/27/2020 1129 Last data filed at 02/27/2020 0845 Gross per 24 hour  Intake 2900.02 ml  Output --  Net 2900.02 ml   Physical Exam  Gen:- Awake Alert,  In no apparent distress  HEENT:- [place].AT, No sclera icterus Neck-Supple Neck,No JVD,.  Lungs-  CTAB , fair symmetrical air movement CV- S1, S2 normal, regular  Abd-  +ve B.Sounds, Abd Soft, No tenderness,    Extremity/Skin:-  pedal pulses present  Psych-affect is appropriate, oriented x3 Neuro-no new focal deficits, no tremors MSK- Left foot infected diabetic ulcer with drainage, significant erythema and swelling and  warmth--- please see photos in epic   Data Review:   Micro Results Recent Results (from the past 240 hour(s))  Aerobic Culture (superficial specimen)     Status: None   Collection Time: 02/18/20 12:30 PM   Specimen: Wound  Result Value Ref Range Status   Specimen Description   Final    WOUND LEFT LATERAL FOOT Performed at [facility] [address], [place], [place] [address]    Special Requests   Final  NONE Performed at [facility], [place] [address], [place], [place] [address]    Gram Stain   Final    MODERATE WBC PRESENT, PREDOMINANTLY PMN ABUNDANT GRAM NEGATIVE COCCI ABUNDANT GRAM NEGATIVE RODS FEW GRAM POSITIVE RODS    Culture   Final    ABUNDANT GROUP B STREP(S.AGALACTIAE)ISOLATED TESTING AGAINST S. AGALACTIAE NOT ROUTINELY PERFORMED DUE TO PREDICTABILITY OF AMP/PEN/VAN SUSCEPTIBILITY. ABUNDANT STREPTOCOCCUS ANGINOSIS CULTURE REINCUBATED FOR BETTER GROWTH Performed at [facility], [place] [address] [place], [place] [address]    Report Status 02/23/2020 FINAL  Final   Organism ID, Bacteria STREPTOCOCCUS ANGINOSIS  Final      Susceptibility   Streptococcus anginosis - MIC*    PENICILLIN <=0.06 SENSITIVE Sensitive     CEFTRIAXONE 0.25 SENSITIVE Sensitive     ERYTHROMYCIN <=0.12 SENSITIVE Sensitive     LEVOFLOXACIN 1 SENSITIVE Sensitive     VANCOMYCIN 0.5 SENSITIVE Sensitive     * ABUNDANT STREPTOCOCCUS ANGINOSIS  Wound or Superficial Culture     Status: None (Preliminary result)   Collection Time: 02/26/20  5:01 PM   Specimen: Ulcer; Wound  Result Value Ref Range Status   Specimen Description   Final    ULCER Performed at [facility], [address], [place], [place] [address]    Special Requests   Final    NONE Performed at [facility], [address], [place], [place] [address]    Gram Stain   Final    FEW WBC PRESENT, PREDOMINANTLY PMN FEW GRAM NEGATIVE RODS RARE GRAM POSITIVE COCCI IN CLUSTERS Performed at  [facility], [place] [address], [place], [place] [address]    Culture PENDING  Incomplete   Report Status PENDING  Incomplete  SARS Coronavirus 2 by RT PCR (hospital order, performed in [facility] hospital lab) Nasopharyngeal Nasopharyngeal Swab     Status: None   Collection Time: 02/26/20  6:22 PM   Specimen: Nasopharyngeal Swab  Result Value Ref Range Status   SARS Coronavirus 2 NEGATIVE NEGATIVE Final    Comment: (NOTE) SARS-CoV-2 target nucleic acids are NOT DETECTED.  The SARS-CoV-2 RNA is generally detectable in upper and lower respiratory specimens during the acute phase of infection. The lowest concentration of SARS-CoV-2 viral copies this assay can detect is 250 copies / mL. A negative result does not preclude SARS-CoV-2 infection and should not be used as the sole basis for treatment or other patient management decisions.  A negative result may occur with improper specimen collection / handling, submission of specimen other than nasopharyngeal swab, presence of viral mutation(s) within the areas targeted by this assay, and inadequate number of viral copies (<250 copies / mL). A negative result must be combined with clinical observations, patient history, and epidemiological information.  Fact Sheet for Patients:   StrictlyIdeas.no  Fact Sheet for Healthcare Providers: BankingDealers.co.za  This test is not yet approved or  cleared by the [place] FDA and has been authorized for detection and/or diagnosis of SARS-CoV-2 by FDA under an Emergency Use Authorization (EUA).  This EUA will remain in effect (meaning this test can be used) for the duration of the COVID-19 declaration under Section 564(b)(1) of the Act, 21 U.S.C. section 360bbb-3(b)(1), unless the authorization is terminated or revoked sooner.  Performed at [facility], [address], [place], [place] [address]   Blood Cultures x 2 sites     Status: None  (Preliminary result)   Collection Time: 02/26/20 11:22 PM   Specimen: BLOOD  Result Value Ref Range Status   Specimen Description BLOOD LEFT ANTECUBITAL  Final   Special Requests   Final    BOTTLES DRAWN AEROBIC AND ANAEROBIC Blood Culture adequate volume   Culture   Final    NO GROWTH < 12 HOURS Performed at [facility], [address] [place], [place] [address]    Report Status PENDING  Incomplete  Blood Cultures x 2 sites     Status: None (Preliminary result)   Collection Time: 02/26/20 11:33 PM   Specimen: BLOOD LEFT HAND  Result Value Ref Range Status   Specimen Description BLOOD LEFT HAND  Final   Special Requests AEROBIC BOTTLE ONLY Blood Culture adequate volume  Final   Culture   Final    NO GROWTH < 12 HOURS Performed at [facility], [address] [place], [place] [address]    Report Status PENDING  Incomplete    Radiology Reports DG Tibia/Fibula Left  Result Date: 02/26/2020 CLINICAL DATA:  Left lower extremity redness. EXAM: LEFT TIBIA AND FIBULA - 2 VIEW COMPARISON:  None. FINDINGS: There is no evidence of fracture or other focal bone lesions. Mild soft tissue swelling is seen along the anterior aspect of the distal left tibia. This is seen on the lateral view. IMPRESSION: Mild soft tissue swelling without evidence of an acute osseous abnormality. Electronically Signed   By: Virgina Norfolk M.D.   On: 02/26/2020 17:33   DG Ankle Complete Left  Result Date: 02/26/2020 CLINICAL DATA:  Left foot ulcer with left tib/fib redness. EXAM: LEFT ANKLE COMPLETE - 3+ VIEW COMPARISON:  None. FINDINGS: There is no evidence of fracture, dislocation, or joint effusion. Mild degenerative changes seen along the dorsal aspect of the mid left foot. Moderate severity soft tissue swelling is noted along the medial aspect of the left ankle. Moderate severity soft tissue swelling of the dorsal aspect of the mid left foot is also noted. IMPRESSION: 1. Moderate severity medial soft tissue  swelling without evidence of acute fracture. 2. Mild degenerative changes without evidence of an acute osseous abnormality. Electronically Signed   By: Virgina Norfolk M.D.   On: 02/26/2020 17:35   US ARTERIAL ABI (SCREENING LOWER EXTREMITY)  Result Date: 02/27/2020 CLINICAL DATA:  Infected diabetic left foot ulcer. Redness with cellulitis. EXAM: NONINVASIVE PHYSIOLOGIC VASCULAR STUDY OF BILATERAL LOWER EXTREMITIES TECHNIQUE: Evaluation of both lower extremities were performed at rest, including calculation of ankle-brachial indices with single level Doppler, pressure and pulse volume recording. COMPARISON:  09/14/2019 FINDINGS: Right ABI: 1.12, previously 0.94 Left ABI:  1.06, previously 0.95 Right Lower Extremity: Biphasic Doppler waveforms in the right posterior tibial artery and similar to the previous examination. Primarily monophasic waveforms in the right dorsalis pedis artery. Left Lower Extremity: Biphasic Doppler waveforms in left dorsalis pedis artery. Difficult to characterize waveforms in left posterior tibial artery. 1.0-1.4 Normal IMPRESSION: Normal ankle-brachial indices bilaterally. Minimal change from the prior examination. Electronically Signed   By: Markus Daft M.D.   On: 02/27/2020 10:35   DG Foot Complete Left  Result Date: 02/26/2020 CLINICAL DATA:  Left foot ulcer. EXAM: LEFT FOOT - COMPLETE 3+ VIEW COMPARISON:  None. FINDINGS: There is no evidence of an acute fracture or dislocation. Mild degenerative changes are seen along the dorsal aspect of the mid left foot. There is moderate severity soft tissue swelling of the dorsal aspect of the distal left foot. A 1.3 cm x 0.7 cm superficial soft tissue defect is seen along the lateral aspect of the mid  left foot. This is adjacent to the base of the fifth left metatarsal. Mild-to-moderate severity focal soft tissue swelling is also seen within this region. IMPRESSION: 1. Soft tissue ulceration along the lateral aspect of the mid left foot  with associated soft tissue swelling. 2. No acute fracture or acute osteomyelitis. 3. Mild degenerative changes, as described above. Electronically Signed   By: Virgina Norfolk M.D.   On: 02/26/2020 17:37   DG Toe Great Right  Result Date: 02/26/2020 CLINICAL DATA:  Left foot ulcer. EXAM: RIGHT GREAT TOE COMPARISON:  None. FINDINGS: There is no evidence of an acute fracture or dislocation. A chronic appearing deformity is seen involving the tuft of the distal phalanx of the right great toe. Moderate severity diffuse soft tissue swelling is noted. IMPRESSION: Soft tissue swelling without evidence of acute osteomyelitis. MRI correlation is recommended if this remains of clinical concern. Electronically Signed   By: Virgina Norfolk M.D.   On: 02/26/2020 17:32     CBC Recent Labs  Lab 02/26/20 1725  WBC 16.0*  HGB 11.4*  HCT 32.8*  PLT 222  MCV 88.9  MCH 30.9  MCHC 34.8  RDW 14.1  LYMPHSABS 0.5*  MONOABS 0.9  EOSABS 0.0  BASOSABS 0.0    Chemistries  Recent Labs  Lab 02/26/20 1725 02/26/20 2322 02/27/20 0548  NA 128* 135 133*  K 3.6 3.4* 3.9  CL 91* 101 101  CO2 21* 24 21*  GLUCOSE 367* 162* 252*  BUN 13 10 9   CREATININE 0.83 0.72 0.62  CALCIUM 8.9 8.2* 8.0*  AST 14*  --   --   ALT 11  --   --   ALKPHOS 61  --   --   BILITOT 1.1  --   --    No results for input(s): CHOL, HDL, LDLCALC, TRIG, CHOLHDL, LDLDIRECT in the last 72 hours.  Lab Results  Component Value Date   HGBA1C 6.0 (H) 12/02/2017   No results for input(s): TSH, T4TOTAL, T3FREE, THYROIDAB in the last 72 hours.  Invalid input(s): FREET3 ------------------------------------------------------------------------------------------------------------------ No results for input(s): VITAMINB12, FOLATE, FERRITIN, TIBC, IRON, RETICCTPCT in the last 72 hours.  Coagulation profile No results for input(s): INR, PROTIME in the last 168 hours.  No results for input(s): DDIMER in the last 72 hours.  Cardiac  Enzymes No results for input(s): CKMB, TROPONINI, MYOGLOBIN in the last 168 hours.  Invalid input(s): CK ------------------------------------------------------------------------------------------------------------------ No results found for: BNP  -Phone conference on 02/27/20 with Dr. Donzetta Matters from vascular surgery and Dr. Constance Haw from general surgery--after reviewing patient's ABI, and wound images--decision made to transfer patient to [facility] for possible angiogram and further vascular surgery evaluation--to see if she is a candidate for revascularization -Patient will need general surgery and possibly Dr. Jess Barters consult at [facility] after Vascular Surgery work-up is completed  Roxan Hockey M.D on 02/27/2020 at 11:29 AM  Go to [email or web address] - for contact info  [facility] - Office  [phone]

## 2020-02-27 NOTE — Consult Note (Signed)
[facility] Consult  Reason for Consult: Left lateral foot ulcer  Referring Physician:  Dr. Denton Brick   Chief Complaint    Foot Ulcer      HPI: Jessica Dunlap is a 64 y.o. female with a chronic left lateral foot ulcer that she says has been there for several months and says her foot has been looking deformed and curving for the past several years. She has long standing diabetes and neuropathy and has never been told she has findings consistent with Charcot's foot.  She says she has been going wound center and getting some treatment and packing. She says that she was suppose to get her wound packed daily but her sister was not always able to do that and she went for a few days between at some time.   She has had ABI in the past that were reportedly normal March 2021 and repeat today are read as normal but given her diabetes are falsely elevated.  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   Past Medical History:  Diagnosis Date  . Anemia   . Cancer (HCC)    cervical  . Cirrhosis, non-alcoholic ([place])   . Coronary artery disease   . Depression   . Diabetes mellitus without complication ([place])   . Hypertension   . Hypothyroidism   . Neuropathy   . Tachycardia   . Thyroid disease     Past Surgical History:  Procedure Laterality Date  . ABDOMINAL HYSTERECTOMY    . APPENDECTOMY    . CATARACT EXTRACTION W/PHACO Left 12/08/2017   Procedure: CATARACT EXTRACTION PHACO AND INTRAOCULAR LENS PLACEMENT LEFT EYE;  Surgeon: Tonny Branch, MD;  Location: [facility];  Service: Ophthalmology;  Laterality: Left;  CDE: 10.09  . CATARACT EXTRACTION W/PHACO Right 12/22/2017   Procedure: CATARACT EXTRACTION PHACO AND INTRAOCULAR LENS PLACEMENT RIGHT EYE;  Surgeon: Tonny Branch, MD;  Location: [facility];  Service: Ophthalmology;  Laterality: Right;  CDE: 14.20    History reviewed. No  pertinent family history.  Social History   Tobacco Use  . Smoking status: Former Smoker    Packs/day: 1.50    Years: 30.00    Pack years: 45.00    Types: Cigarettes    Quit date: 12/03/2007    Years since quitting: 12.2  . Smokeless tobacco: Never Used  Vaping Use  . Vaping Use: Never used  Substance Use Topics  . Alcohol use: No  . Drug use: No    Medications: I have reviewed the patient's current medications. Current Facility-Administered Medications  Medication Dose Route Frequency Provider Last Rate Last Admin  . 0.9 %  sodium chloride infusion   Intravenous Continuous Roxan Hockey, MD 100 mL/hr at 02/27/20 0846 New Bag at 02/27/20 0846  . anti-nausea (EMETROL) solution 10 mL  10 mL Oral PRN Truett Mainland, DO      . aspirin EC tablet 81 mg  81 mg Oral Daily Sharion Settler, NP   81 mg at 02/27/20 1130  . carbamazepine (TEGRETOL) chewable tablet 100 mg  100 mg Oral BID Truett Mainland, DO   100 mg at 02/27/20 1130  . cefTRIAXone (ROCEPHIN) 2 g in sodium chloride 0.9 % 100 mL IVPB  2 g Intravenous Q24H Truett Mainland, DO   Stopped at 02/26/20 2350   And  . metroNIDAZOLE (FLAGYL) IVPB  500 mg  500 mg Intravenous Q8H Truett Mainland, DO   Paused at 02/27/20 0831  . dextrose 50 % solution 0-50 mL  0-50 mL Intravenous PRN Truett Mainland, DO      . dextrose 50 % solution 0-50 mL  0-50 mL Intravenous PRN Truett Mainland, DO      . diltiazem (CARDIZEM CD) 24 hr capsule 120 mg  120 mg Oral Daily Sharion Settler, NP   120 mg at 02/27/20 1131  . DULoxetine (CYMBALTA) DR capsule 30 mg  30 mg Oral QHS Sharion Settler, NP   30 mg at 02/26/20 2359  . DULoxetine (CYMBALTA) DR capsule 60 mg  60 mg Oral QHS Sharion Settler, NP   60 mg at 02/26/20 2359  . enoxaparin (LOVENOX) injection 40 mg  40 mg Subcutaneous Q24H Truett Mainland, DO   40 mg at 02/27/20 1130  . HYDROmorphone (DILAUDID) tablet 16 mg  16 mg Oral BID Denton Brick, Courage, MD   16 mg at 02/27/20 1355  . [START ON  02/28/2020] HYDROmorphone (DILAUDID) tablet 24 mg  24 mg Oral Daily Emokpae, Courage, MD      . insulin aspart (novoLOG) injection 0-15 Units  0-15 Units Subcutaneous TID WC Reubin Milan, MD   5 Units at 02/27/20 1306  . insulin glargine (LANTUS) injection 12 Units  12 Units Subcutaneous QHS Reubin Milan, MD   12 Units at 02/27/20 0157  . levothyroxine (SYNTHROID) tablet 25 mcg  25 mcg Oral Q0600 Truett Mainland, DO   25 mcg at 02/27/20 0534  . metoprolol tartrate (LOPRESSOR) tablet 50 mg  50 mg Oral BID Sharion Settler, NP   50 mg at 02/27/20 1130  . morphine (MS CONTIN) 12 hr tablet 30 mg  30 mg Oral QHS Sharion Settler, NP   30 mg at 02/26/20 2359  . polyvinyl alcohol (LIQUIFILM TEARS) 1.4 % ophthalmic solution 1 drop  1 drop Both Eyes TID PRN Truett Mainland, DO      . traZODone (DESYREL) tablet 150 mg  150 mg Oral QHS Sharion Settler, NP   150 mg at 02/27/20 0000   Current Outpatient Medications  Medication Sig Dispense Refill Last Dose  . amoxicillin-clavulanate (AUGMENTIN) 875-125 MG tablet Take 1 tablet by mouth 2 (two) times daily.   02/26/2020 at Unknown time  . anti-nausea (EMETROL) solution Take 10 mLs by mouth as needed for nausea or vomiting.   Past Month at Unknown time  . aspirin EC 81 MG tablet Take 81 mg by mouth daily.   02/26/2020 at 0900  . carbamazepine (TEGRETOL XR) 200 MG 12 hr tablet Take 200 mg by mouth 2 (two) times daily as needed (neuropathy).    Past Month at Unknown time  . diltiazem (TIAZAC) 120 MG 24 hr capsule Take 120 mg by mouth daily.   02/26/2020 at Unknown time  . DULoxetine (CYMBALTA) 30 MG capsule Take 1 capsule by mouth daily. Take with 60mg  cap   02/25/2020 at Unknown time  . DULoxetine (CYMBALTA) 60 MG capsule Take 60 mg by mouth at bedtime. Take with 30mg  cap   02/25/2020 at Unknown time  . furosemide (LASIX) 20 MG tablet Take 20 mg by mouth daily.   02/26/2020 at Unknown time  . glipiZIDE (GLUCOTROL) 10 MG tablet Take 10 mg by mouth daily.    02/26/2020 at Unknown time  . HYDROmorphone (DILAUDID) 8 MG tablet Take 16 mg by mouth 3 (three) times daily as needed for moderate pain.  02/26/2020 at 1400  . ibuprofen (ADVIL,MOTRIN) 200 MG tablet Take 400 mg by mouth daily as needed for headache or moderate pain.   Past Month at Unknown time  . levothyroxine (SYNTHROID, LEVOTHROID) 200 MCG tablet Take 200 mcg by mouth at bedtime.    02/25/2020 at Unknown time  . metoprolol (LOPRESSOR) 50 MG tablet Take 50 mg by mouth 2 (two) times daily.   02/26/2020 at 1400  . morphine (MS CONTIN) 30 MG 12 hr tablet Take 30 mg by mouth at bedtime.    02/25/2020 at Unknown time  . Polyethyl Glycol-Propyl Glycol (SYSTANE OP) Place 1 drop into both eyes 3 (three) times daily as needed (dry eyes).   Past Week at Unknown time  . spironolactone (ALDACTONE) 25 MG tablet Take 50 mg by mouth daily.   02/26/2020 at Unknown time  . traZODone (DESYREL) 150 MG tablet Take 150 mg by mouth at bedtime. For sleep   02/25/2020 at Unknown time  . vitamin E 400 UNIT capsule Take 400 Units by mouth 2 (two) times daily.   02/26/2020 at Unknown time  . alendronate (FOSAMAX) 70 MG tablet Take 70 mg by mouth once a week. Take with a full glass of water on an empty stomach.   02/20/2020  . levothyroxine (SYNTHROID, LEVOTHROID) 25 MCG tablet Take 25 mcg by mouth at bedtime. Take one tablet along with 28mcg tablet for total dose of 26mcg. (Patient not taking: Reported on 02/26/2020)   Not Taking at Unknown time  . metFORMIN (GLUCOPHAGE) 500 MG tablet Take 1 tablet (500 mg total) by mouth 2 (two) times daily with a meal. (Patient taking differently: Take 1,000 mg by mouth 2 (two) times daily with a meal. ) 60 tablet 0     No Known Allergies   ROS:  A comprehensive review of systems was negative except for: Musculoskeletal: positive for draining left foot ulcer and charcots foot  Blood pressure 131/60, pulse 94, temperature 98.7 F (37.1 C), temperature source Oral, resp. rate 18, height 5'  3" (1.6 m), weight 59.4 kg, SpO2 99 %. Physical Exam Vitals reviewed.  Constitutional:      Appearance: Normal appearance.  HENT:     Head: Normocephalic.     Nose: Nose normal.     Mouth/Throat:     Mouth: Mucous membranes are moist.  Eyes:     Extraocular Movements: Extraocular movements intact.  Cardiovascular:     Rate and Rhythm: Normal rate.     Pulses:          Dorsalis pedis pulses are 1+ on the left side.       Posterior tibial pulses are 1+ on the left side.  Pulmonary:     Effort: Pulmonary effort is normal.  Abdominal:     General: There is no distension.     Palpations: Abdomen is soft.     Tenderness: There is no abdominal tenderness.  Musculoskeletal:     Cervical back: Normal range of motion.     Comments: Right foot with normal appearance, dry ulcer on the first digit plantar ulcer; left foot -charcots foot, lateral ulcer tracking superficially to the plantar surface, purulence expressed, iodoform gauze packed into wound   Skin:    General: Skin is warm.  Neurological:     General: No focal deficit present.     Mental Status: She is alert and oriented to person, place, and time.  Psychiatric:        Mood and Affect: Mood normal.  Behavior: Behavior normal.        Thought Content: Thought content normal.        Judgment: Judgment normal.       Results: Results for orders placed or performed during the hospital encounter of 02/26/20 (from the past 48 hour(s))  CBG monitoring, ED     Status: Abnormal   Collection Time: 02/26/20  4:47 PM  Result Value Ref Range   Glucose-Capillary 359 (H) 70 - 99 mg/dL    Comment: Glucose reference range applies only to samples taken after fasting for at least 8 hours.  Wound or Superficial Culture     Status: None (Preliminary result)   Collection Time: 02/26/20  5:01 PM   Specimen: Ulcer; Wound  Result Value Ref Range   Specimen Description      ULCER Performed at [facility], [address]  [place], [place] [address]    Special Requests      NONE Performed at [facility], [address] [place], [place] [address]    Gram Stain      FEW WBC PRESENT, PREDOMINANTLY PMN FEW GRAM NEGATIVE RODS RARE GRAM POSITIVE COCCI IN CLUSTERS Performed at [facility], [place] [address], [place], [place] [address]    Culture PENDING    Report Status PENDING   CBC with Differential/Platelet     Status: Abnormal   Collection Time: 02/26/20  5:25 PM  Result Value Ref Range   WBC 16.0 (H) 4.0 - 10.5 K/uL   RBC 3.69 (L) 3.87 - 5.11 MIL/uL   Hemoglobin 11.4 (L) 12.0 - 15.0 g/dL   HCT 32.8 (L) 36 - 46 %   MCV 88.9 80.0 - 100.0 fL   MCH 30.9 26.0 - 34.0 pg   MCHC 34.8 30.0 - 36.0 g/dL   RDW 14.1 11.5 - 15.5 %   Platelets 222 150 - 400 K/uL   nRBC 0.0 0.0 - 0.2 %   Neutrophils Relative % 90 %   Neutro Abs 14.4 (H) 1.7 - 7.7 K/uL   Lymphocytes Relative 3 %   Lymphs Abs 0.5 (L) 0.7 - 4.0 K/uL   Monocytes Relative 6 %   Monocytes Absolute 0.9 0 - 1 K/uL   Eosinophils Relative 0 %   Eosinophils Absolute 0.0 0 - 0 K/uL   Basophils Relative 0 %   Basophils Absolute 0.0 0 - 0 K/uL   Immature Granulocytes 1 %   Abs Immature Granulocytes 0.14 (H) 0.00 - 0.07 K/uL    Comment: Performed at [facility], [address], [place],  [address]  Comprehensive metabolic panel     Status: Abnormal   Collection Time: 02/26/20  5:25 PM  Result Value Ref Range   Sodium 128 (L) 135 - 145 mmol/L   Potassium 3.6 3.5 - 5.1 mmol/L   Chloride 91 (L) 98 - 111 mmol/L   CO2 21 (L) 22 - 32 mmol/L   Glucose, Bld 367 (H) 70 - 99 mg/dL    Comment: Glucose reference range applies only to samples taken after fasting for at least 8 hours.   BUN 13 8 - 23 mg/dL   Creatinine, Ser 0.83 0.44 - 1.00 mg/dL   Calcium 8.9 8.9 - 10.3 mg/dL   Total Protein 8.3 (H) 6.5 - 8.1 g/dL   Albumin 3.9 3.5 - 5.0 g/dL   AST 14 (L) 15 - 41 U/L   ALT 11 0 - 44 U/L   Alkaline Phosphatase 61 38 - 126 U/L  Total Bilirubin 1.1 0.3  - 1.2 mg/dL   GFR calc non Af Amer >60 >60 mL/min   GFR calc Af Amer >60 >60 mL/min   Anion gap 16 (H) 5 - 15    Comment: Performed at [facility], [address], [place], [place] [address]  Lactic acid, plasma     Status: Abnormal   Collection Time: 02/26/20  5:25 PM  Result Value Ref Range   Lactic Acid, Venous 3.9 (HH) 0.5 - 1.9 mmol/L    Comment: CRITICAL RESULT CALLED TO, READ BACK BY AND VERIFIED WITH: E GANTT,[id number]  02/26/20 [facility] Performed at [facility], [address], [place], [place] [address]   SARS Coronavirus 2 by RT PCR (hospital order, performed in [place] hospital lab) Nasopharyngeal Nasopharyngeal Swab     Status: None   Collection Time: 02/26/20  6:22 PM   Specimen: Nasopharyngeal Swab  Result Value Ref Range   SARS Coronavirus 2 NEGATIVE NEGATIVE    Comment: (NOTE) SARS-CoV-2 target nucleic acids are NOT DETECTED.  The SARS-CoV-2 RNA is generally detectable in upper and lower respiratory specimens during the acute phase of infection. The lowest concentration of SARS-CoV-2 viral copies this assay can detect is 250 copies / mL. A negative result does not preclude SARS-CoV-2 infection and should not be used as the sole basis for treatment or other patient management decisions.  A negative result may occur with improper specimen collection / handling, submission of specimen other than nasopharyngeal swab, presence of viral mutation(s) within the areas targeted by this assay, and inadequate number of viral copies (<250 copies / mL). A negative result must be combined with clinical observations, patient history, and epidemiological information.  Fact Sheet for Patients:   StrictlyIdeas.no  Fact Sheet for Healthcare Providers: BankingDealers.co.za  This test is not yet approved or  cleared by the [place] FDA and has been authorized for detection and/or diagnosis of SARS-CoV-2 by FDA under an Emergency  Use Authorization (EUA).  This EUA will remain in effect (meaning this test can be used) for the duration of the COVID-19 declaration under Section 564(b)(1) of the Act, 21 U.S.C. section 360bbb-3(b)(1), unless the authorization is terminated or revoked sooner.  Performed at [facility], [address] [place], [place] [address]   CBG monitoring, ED     Status: Abnormal   Collection Time: 02/26/20  6:38 PM  Result Value Ref Range   Glucose-Capillary 352 (H) 70 - 99 mg/dL    Comment: Glucose reference range applies only to samples taken after fasting for at least 8 hours.  CBG monitoring, ED     Status: Abnormal   Collection Time: 02/26/20  7:46 PM  Result Value Ref Range   Glucose-Capillary 320 (H) 70 - 99 mg/dL    Comment: Glucose reference range applies only to samples taken after fasting for at least 8 hours.  CBG monitoring, ED     Status: Abnormal   Collection Time: 02/26/20  9:07 PM  Result Value Ref Range   Glucose-Capillary 167 (H) 70 - 99 mg/dL    Comment: Glucose reference range applies only to samples taken after fasting for at least 8 hours.  CBG monitoring, ED     Status: Abnormal   Collection Time: 02/26/20 10:10 PM  Result Value Ref Range   Glucose-Capillary 148 (H) 70 - 99 mg/dL    Comment: Glucose reference range applies only to samples taken after fasting for at least 8 hours.  CBG monitoring, ED  Status: Abnormal   Collection Time: 02/26/20 11:14 PM  Result Value Ref Range   Glucose-Capillary 173 (H) 70 - 99 mg/dL    Comment: Glucose reference range applies only to samples taken after fasting for at least 8 hours.  Prealbumin     Status: Abnormal   Collection Time: 02/26/20 11:22 PM  Result Value Ref Range   Prealbumin 9.5 (L) 18 - 38 mg/dL    Comment: Performed at [facility] [address], [place], [place] [address]  Blood Cultures x 2 sites     Status: None (Preliminary result)   Collection Time: 02/26/20 11:22 PM   Specimen: BLOOD  Result  Value Ref Range   Specimen Description BLOOD LEFT ANTECUBITAL    Special Requests      BOTTLES DRAWN AEROBIC AND ANAEROBIC Blood Culture adequate volume   Culture      NO GROWTH < 12 HOURS Performed at [facility], [address] [place], [place] [address]    Report Status PENDING   C-reactive protein     Status: Abnormal   Collection Time: 02/26/20 11:22 PM  Result Value Ref Range   CRP 21.6 (H) <1.0 mg/dL    Comment: Performed at [facility], [address], [place], [place] [address]  HIV Antibody (routine testing w rflx)     Status: None   Collection Time: 02/26/20 11:22 PM  Result Value Ref Range   HIV Screen 4th Generation wRfx Non Reactive Non Reactive    Comment: Performed at [facility], [place] [address], [place], [place] [address]  Hemoglobin A1c     Status: Abnormal   Collection Time: 02/26/20 11:22 PM  Result Value Ref Range   Hgb A1c MFr Bld 6.7 (H) 4.8 - 5.6 %    Comment: (NOTE) Pre diabetes:          5.7%-6.4%  Diabetes:              >6.4%  Glycemic control for   <7.0% adults with diabetes    Mean Plasma Glucose 145.59 mg/dL    Comment: Performed at [facility] [address] [place], [place] [address]  Sedimentation rate     Status: Abnormal   Collection Time: 02/26/20 11:22 PM  Result Value Ref Range   Sed Rate 140 (H) 0 - 22 mm/hr    Comment: Performed at [facility], [address] [place], [place] [address]  Basic metabolic panel     Status: Abnormal   Collection Time: 02/26/20 11:22 PM  Result Value Ref Range   Sodium 135 135 - 145 mmol/L    Comment: DELTA CHECK NOTED   Potassium 3.4 (L) 3.5 - 5.1 mmol/L   Chloride 101 98 - 111 mmol/L   CO2 24 22 - 32 mmol/L   Glucose, Bld 162 (H) 70 - 99 mg/dL    Comment: Glucose reference range applies only to samples taken after fasting for at least 8 hours.   BUN 10 8 - 23 mg/dL   Creatinine, Ser 0.72 0.44 - 1.00 mg/dL   Calcium 8.2 (L) 8.9 - 10.3 mg/dL   GFR calc non Af Amer >60 >60 mL/min    GFR calc Af Amer >60 >60 mL/min   Anion gap 10 5 - 15    Comment: Performed at [facility], [address] [place], [place] [address]  Beta-hydroxybutyric acid     Status: None   Collection Time: 02/26/20 11:22 PM  Result Value Ref Range  Beta-Hydroxybutyric Acid 0.10 0.05 - 0.27 mmol/L    Comment: Performed at [facility], [address], [place], [place] [address]  Blood Cultures x 2 sites     Status: None (Preliminary result)   Collection Time: 02/26/20 11:33 PM   Specimen: BLOOD LEFT HAND  Result Value Ref Range   Specimen Description BLOOD LEFT HAND    Special Requests AEROBIC BOTTLE ONLY Blood Culture adequate volume    Culture      NO GROWTH < 12 HOURS Performed at [facility], [address] [place], [place] [address]    Report Status PENDING   CBG monitoring, ED     Status: Abnormal   Collection Time: 02/27/20  1:21 AM  Result Value Ref Range   Glucose-Capillary 156 (H) 70 - 99 mg/dL    Comment: Glucose reference range applies only to samples taken after fasting for at least 8 hours.  CBG monitoring, ED     Status: Abnormal   Collection Time: 02/27/20  3:07 AM  Result Value Ref Range   Glucose-Capillary 188 (H) 70 - 99 mg/dL    Comment: Glucose reference range applies only to samples taken after fasting for at least 8 hours.  Hemoglobin A1c     Status: Abnormal   Collection Time: 02/27/20  5:48 AM  Result Value Ref Range   Hgb A1c MFr Bld 6.7 (H) 4.8 - 5.6 %    Comment: (NOTE) Pre diabetes:          5.7%-6.4%  Diabetes:              >6.4%  Glycemic control for   <7.0% adults with diabetes    Mean Plasma Glucose 145.59 mg/dL    Comment: Performed at [facility] [address] [place], [place] [address]  Basic metabolic panel     Status: Abnormal   Collection Time: 02/27/20  5:48 AM  Result Value Ref Range   Sodium 133 (L) 135 - 145 mmol/L   Potassium 3.9 3.5 - 5.1 mmol/L   Chloride 101 98 - 111 mmol/L   CO2 21 (L) 22 - 32 mmol/L   Glucose, Bld  252 (H) 70 - 99 mg/dL    Comment: Glucose reference range applies only to samples taken after fasting for at least 8 hours.   BUN 9 8 - 23 mg/dL   Creatinine, Ser 0.62 0.44 - 1.00 mg/dL   Calcium 8.0 (L) 8.9 - 10.3 mg/dL   GFR calc non Af Amer >60 >60 mL/min   GFR calc Af Amer >60 >60 mL/min   Anion gap 11 5 - 15    Comment: Performed at [facility], [address], [place],  [address]  CBG monitoring, ED     Status: Abnormal   Collection Time: 02/27/20  8:52 AM  Result Value Ref Range   Glucose-Capillary 243 (H) 70 - 99 mg/dL    Comment: Glucose reference range applies only to samples taken after fasting for at least 8 hours.  CBG monitoring, ED     Status: Abnormal   Collection Time: 02/27/20 12:59 PM  Result Value Ref Range   Glucose-Capillary 222 (H) 70 - 99 mg/dL    Comment: Glucose reference range applies only to samples taken after fasting for at least 8 hours.   Personally reviewed Xray left foot- deformity/ degeneration of the metatarsals, left lateral ulceration of soft tissue  DG Tibia/Fibula Left  Result Date: 02/26/2020 CLINICAL DATA:  Left lower extremity redness. EXAM: LEFT  TIBIA AND FIBULA - 2 VIEW COMPARISON:  None. FINDINGS: There is no evidence of fracture or other focal bone lesions. Mild soft tissue swelling is seen along the anterior aspect of the distal left tibia. This is seen on the lateral view. IMPRESSION: Mild soft tissue swelling without evidence of an acute osseous abnormality. Electronically Signed   By: Virgina Norfolk M.D.   On: 02/26/2020 17:33   DG Ankle Complete Left  Result Date: 02/26/2020 CLINICAL DATA:  Left foot ulcer with left tib/fib redness. EXAM: LEFT ANKLE COMPLETE - 3+ VIEW COMPARISON:  None. FINDINGS: There is no evidence of fracture, dislocation, or joint effusion. Mild degenerative changes seen along the dorsal aspect of the mid left foot. Moderate severity soft tissue swelling is noted along the medial aspect of the left ankle.  Moderate severity soft tissue swelling of the dorsal aspect of the mid left foot is also noted. IMPRESSION: 1. Moderate severity medial soft tissue swelling without evidence of acute fracture. 2. Mild degenerative changes without evidence of an acute osseous abnormality. Electronically Signed   By: Virgina Norfolk M.D.   On: 02/26/2020 17:35   US ARTERIAL ABI (SCREENING LOWER EXTREMITY)  Result Date: 02/27/2020 CLINICAL DATA:  Infected diabetic left foot ulcer. Redness with cellulitis. EXAM: NONINVASIVE PHYSIOLOGIC VASCULAR STUDY OF BILATERAL LOWER EXTREMITIES TECHNIQUE: Evaluation of both lower extremities were performed at rest, including calculation of ankle-brachial indices with single level Doppler, pressure and pulse volume recording. COMPARISON:  09/14/2019 FINDINGS: Right ABI: 1.12, previously 0.94 Left ABI:  1.06, previously 0.95 Right Lower Extremity: Biphasic Doppler waveforms in the right posterior tibial artery and similar to the previous examination. Primarily monophasic waveforms in the right dorsalis pedis artery. Left Lower Extremity: Biphasic Doppler waveforms in left dorsalis pedis artery. Difficult to characterize waveforms in left posterior tibial artery. 1.0-1.4 Normal IMPRESSION: Normal ankle-brachial indices bilaterally. Minimal change from the prior examination. Electronically Signed   By: Markus Daft M.D.   On: 02/27/2020 10:35   DG Foot Complete Left  Result Date: 02/26/2020 CLINICAL DATA:  Left foot ulcer. EXAM: LEFT FOOT - COMPLETE 3+ VIEW COMPARISON:  None. FINDINGS: There is no evidence of an acute fracture or dislocation. Mild degenerative changes are seen along the dorsal aspect of the mid left foot. There is moderate severity soft tissue swelling of the dorsal aspect of the distal left foot. A 1.3 cm x 0.7 cm superficial soft tissue defect is seen along the lateral aspect of the mid left foot. This is adjacent to the base of the fifth left metatarsal. Mild-to-moderate  severity focal soft tissue swelling is also seen within this region. IMPRESSION: 1. Soft tissue ulceration along the lateral aspect of the mid left foot with associated soft tissue swelling. 2. No acute fracture or acute osteomyelitis. 3. Mild degenerative changes, as described above. Electronically Signed   By: Virgina Norfolk M.D.   On: 02/26/2020 17:37   DG Toe Great Right  Result Date: 02/26/2020 CLINICAL DATA:  Left foot ulcer. EXAM: RIGHT GREAT TOE COMPARISON:  None. FINDINGS: There is no evidence of an acute fracture or dislocation. A chronic appearing deformity is seen involving the tuft of the distal phalanx of the right great toe. Moderate severity diffuse soft tissue swelling is noted. IMPRESSION: Soft tissue swelling without evidence of acute osteomyelitis. MRI correlation is recommended if this remains of clinical concern. Electronically Signed   By: Virgina Norfolk M.D.   On: 02/26/2020 17:32     Assessment & Plan:  Jeani Hawking  L Peace is a 64 y.o. female with DM, neuropathy, diabetic foot ulcer on the lateral and plantar surface that is infected and charcot's foot.  Was able to get the a Q tip through and through the ulceration, packing placed. Patient does not want an amputation but I told her realistically she will have an amputation in her future based on the deformity of her foot and the infection.    -ABI falsely elevated, discussed patient with Dr. Denton Brick and Dr. Donzetta Matters, Vascular. 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  Discussed option of Angiography to see if there is any intervention that can be done and getting her down to [facility] for further evaluation to discuss her ultimate options and the likelihood that she will need a BKA in the future regardless.   -Diabetic foot ulcer packing with iodoform for now, ultimately will likely need some formal debridement but workup with Vascular and options for Charcot's foot may point to a more immediate BKA   -IV  antibiotics for infection  -Blood sugars improving on insulin protocol   All questions were answered to the satisfaction of the patient.   Virl Cagey 02/27/2020, 2:26 PM

## 2020-02-27 NOTE — ED Notes (Signed)
Spoke with Phil at [facility] to set up transportation at this time. 

## 2020-02-27 NOTE — Consult Note (Signed)
WOC Nurse Consult Note: Reason for Consult:Left foot chronic, nonhealing wound secondary to Charcot foot deformity and neuropathy. Right first met head with chronic nonhealing wound. WOC Nursing is simultaneously consulted with General Surgery and Vascular Surgery who have both seen patient. Vascular is assessing for debridement and perhaps an amputation during this hospitalization. Wound type:Neuropathic Pressure Injury POA:N/A  WOC Nursing will not see and will not follow as POC is appropriately managed by VVS at this time.  [place] nursing team will not follow, but will remain available to this patient, the nursing and medical teams.  Please re-consult if needed. Thanks, Maudie Flakes, MSN, RN, [place], Arther Abbott  Pager# [phone]

## 2020-02-27 NOTE — Consult Note (Signed)
Hospital Consult    Reason for Consult:  Left foot infection Referring Physician:  Dr. Denton Brick MRN #:  [id number]  History of Present Illness: This is a 64 y.o. female history of diabetes.  Recently has right great toe small ulceration which she states is healing.  For 2 months has left foot wound.  She does have a deformity of her foot due to neuropathy secondary to diabetes.  She does walk.  She has a caregiver of a disabled adult.  She was admitted with wound of her foot that has been worsening.  She has been followed in the wound care center.  She is on aspirin.  She denies any recent fevers.   Past Medical History:  Diagnosis Date  . Anemia   . Cancer (HCC)    cervical  . Cirrhosis, non-alcoholic ([place])   . Coronary artery disease   . Depression   . Diabetes mellitus without complication ([place])   . Hypertension   . Hypothyroidism   . Neuropathy   . Tachycardia   . Thyroid disease     Past Surgical History:  Procedure Laterality Date  . ABDOMINAL HYSTERECTOMY    . APPENDECTOMY    . CATARACT EXTRACTION W/PHACO Left 12/08/2017   Procedure: CATARACT EXTRACTION PHACO AND INTRAOCULAR LENS PLACEMENT LEFT EYE;  Surgeon: Tonny Branch, MD;  Location: [facility];  Service: Ophthalmology;  Laterality: Left;  CDE: 10.09  . CATARACT EXTRACTION W/PHACO Right 12/22/2017   Procedure: CATARACT EXTRACTION PHACO AND INTRAOCULAR LENS PLACEMENT RIGHT EYE;  Surgeon: Tonny Branch, MD;  Location: [facility];  Service: Ophthalmology;  Laterality: Right;  CDE: 14.20    No Known Allergies  Prior to Admission medications   Medication Sig Start Date End Date Taking? Authorizing Provider  amoxicillin-clavulanate (AUGMENTIN) 875-125 MG tablet Take 1 tablet by mouth 2 (two) times daily. 02/24/20  Yes [provider]  anti-nausea (EMETROL) solution Take 10 mLs by mouth as needed for nausea or vomiting.   Yes [provider]  aspirin EC 81 MG tablet Take 81 mg by mouth daily.   Yes [provider]  carbamazepine (TEGRETOL XR) 200 MG 12 hr tablet Take 200 mg by mouth 2 (two) times daily as needed (neuropathy).  11/22/19  Yes [provider]  diltiazem (TIAZAC) 120 MG 24 hr capsule Take 120 mg by mouth daily. 10/31/17  Yes [provider]  DULoxetine (CYMBALTA) 30 MG capsule Take 1 capsule by mouth daily. Take with 60mg  cap 02/02/20  Yes [provider]  DULoxetine (CYMBALTA) 60 MG capsule Take 60 mg by mouth at bedtime. Take with 30mg  cap   Yes [provider]  furosemide (LASIX) 20 MG tablet Take 20 mg by mouth daily. 02/02/20  Yes [provider]  glipiZIDE (GLUCOTROL) 10 MG tablet Take 10 mg by mouth daily.   Yes [provider]  HYDROmorphone (DILAUDID) 8 MG tablet Take 16 mg by mouth 3 (three) times daily as needed for moderate pain.    Yes [provider]  ibuprofen (ADVIL,MOTRIN) 200 MG tablet Take 400 mg by mouth daily as needed for headache or moderate pain.   Yes [provider]  levothyroxine (SYNTHROID, LEVOTHROID) 200 MCG tablet Take 200 mcg by mouth at bedtime.    Yes [provider]  metoprolol (LOPRESSOR) 50 MG tablet Take 50 mg by mouth 2 (two) times daily.   Yes [provider]  morphine (MS CONTIN) 30 MG 12 hr tablet Take 30 mg by mouth at bedtime.  01/31/20  Yes [provider]  Polyethyl Glycol-Propyl Glycol (SYSTANE OP) Place 1 drop into both eyes 3 (three) times daily as needed (dry eyes).   Yes [provider]  spironolactone (ALDACTONE) 25 MG tablet Take 50 mg by mouth daily.   Yes [provider]  traZODone (DESYREL) 150 MG tablet Take 150 mg by mouth at bedtime. For sleep   Yes [provider]  vitamin E 400 UNIT capsule Take 400 Units by mouth 2 (two) times daily.   Yes [provider]  alendronate (FOSAMAX) 70 MG tablet Take 70 mg by mouth once a week. Take with a full glass of water on an empty stomach.    [provider]  levothyroxine (SYNTHROID, LEVOTHROID) 25 MCG tablet Take 25 mcg by mouth at bedtime. Take one tablet along with 228mcg tablet for total dose of 217mcg. Patient not taking: Reported on 02/26/2020    [provider]  metFORMIN (GLUCOPHAGE) 500 MG tablet Take 1 tablet (500 mg total) by mouth 2 (two) times daily with a meal. Patient taking differently: Take 1,000 mg by mouth 2 (two) times daily with a meal.  03/13/12 11/28/17  Nat Christen, MD    Social History   Socioeconomic History  . Marital status: Married    Spouse name: Not on file  . Number of children: Not on file  . Years of education: Not on file  . Highest education level: Not on file  Occupational History  . Not on file  Tobacco Use  . Smoking status: Former Smoker    Packs/day: 1.50    Years: 30.00    Pack years: 45.00    Types: Cigarettes    Quit date: 12/03/2007    Years since quitting: 12.2  . Smokeless tobacco: Never Used  Vaping Use  . Vaping Use: Never used  Substance and Sexual Activity  . Alcohol use: No  . Drug use: No  . Sexual activity: Yes    Birth control/protection: Surgical  Other Topics Concern  . Not on file  Social History Narrative  . Not on file   Social Determinants of Health   Financial Resource Strain:   . Difficulty of Paying Living Expenses: Not on file  Food Insecurity:   . Worried About Charity fundraiser in the Last Year: Not on file  . Ran Out of Food in the Last Year: Not on file  Transportation Needs:   . Lack of Transportation (Medical): Not on file  . Lack of Transportation (Non-Medical): Not on file  Physical Activity:   . Days of Exercise per Week: Not on file  . Minutes of Exercise per Session: Not on file  Stress:   . Feeling of Stress : Not on file  Social Connections:   . Frequency of Communication with Friends and Family: Not on file  . Frequency of Social Gatherings with Friends and Family: Not on file  . Attends Religious Services: Not on  file  . Active Member of Clubs or Organizations: Not on file  . Attends Archivist Meetings: Not on file  . Marital Status: Not on file  Intimate Partner Violence:   . Fear of Current or Ex-Partner: Not on file  . Emotionally Abused: Not on file  . Physically Abused: Not on file  . Sexually Abused: Not on file     History reviewed. No pertinent family history.  ROS:  Cardiovascular: []  chest pain/pressure []  palpitations []  SOB lying flat []  DOE []   pain in legs while walking []  pain in legs at rest []  pain in legs at night []  non-healing ulcers []  hx of DVT [x]  swelling in legs  Pulmonary: []  productive cough []  asthma/wheezing []  home O2  Neurologic: []  weakness in []  arms []  legs []  numbness in []  arms []  legs []  hx of CVA []  mini stroke [] difficulty speaking or slurred speech []  temporary loss of vision in one eye []  dizziness  Hematologic: []  hx of cancer []  bleeding problems []  problems with blood clotting easily  Endocrine:   [x]  diabetes []  thyroid disease  GI []  vomiting blood []  blood in stool  GU: []  CKD/renal failure []  HD--[]  M/W/F or []  T/T/S []  burning with urination []  blood in urine  Psychiatric: []  anxiety []  depression  Musculoskeletal: []  arthritis []  joint pain  Integumentary: []  rashes [x]  ulcers  Constitutional: []  fever []  chills   Physical Examination  Vitals:   02/27/20 1557 02/27/20 1700  BP:  127/70  Pulse:  97  Resp: 16 18  Temp: (!) 97.4 F (36.3 C) 98.4 F (36.9 C)  SpO2:  99%   Body mass index is 23.21 kg/m.  General:  WDWN in NAD Pulmonary: normal non-labored breathing Cardiac: rrr Palpable dp/pt bilaterally Abdomen:  soft, NT/ND, no masses Extremities: left foot with varus deformity with open wound on lateral and medial plantar surface with through and through packing Musculoskeletal: no muscle wasting or atrophy  Neurologic: A&O X 3; Appropriate Affect,  Lack of sensation in both  feet   CBC    Component Value Date/Time   WBC 16.0 (H) 02/26/2020 1725   RBC 3.69 (L) 02/26/2020 1725   HGB 11.4 (L) 02/26/2020 1725   HCT 32.8 (L) 02/26/2020 1725   PLT 222 02/26/2020 1725   MCV 88.9 02/26/2020 1725   MCH 30.9 02/26/2020 1725   MCHC 34.8 02/26/2020 1725   RDW 14.1 02/26/2020 1725   LYMPHSABS 0.5 (L) 02/26/2020 1725   MONOABS 0.9 02/26/2020 1725   EOSABS 0.0 02/26/2020 1725   BASOSABS 0.0 02/26/2020 1725    BMET    Component Value Date/Time   NA 133 (L) 02/27/2020 0548   K 3.9 02/27/2020 0548   CL 101 02/27/2020 0548   CO2 21 (L) 02/27/2020 0548   GLUCOSE 252 (H) 02/27/2020 0548   BUN 9 02/27/2020 0548   CREATININE 0.62 02/27/2020 0548   CALCIUM 8.0 (L) 02/27/2020 0548   GFRNONAA >60 02/27/2020 0548   GFRAA >60 02/27/2020 0548    COAGS: Lab Results  Component Value Date   INR 1.95 (H) 09/06/2010   INR 1.72 (H) 09/04/2010   INR 1.3 07/25/2008     Non-Invasive Vascular Imaging:   Previous ABI's reviewed  ASSESSMENT/PLAN: This is a 64 y.o. female with diabetes and approx 2 month history of left foot wound.  She does have palpable pulses.  I would not recommend any angiography for this reason.  I discussed with her the high likelihood of proximal amputation during this hospitalization she demonstrates good understanding.  We will start with MRI to evaluate if she could have debridement of her foot prior to considering more proximal amputation.  Ailine Hefferan C. Donzetta Matters, MD [facility] [place] Office: [phone] Pager: [phone]

## 2020-02-27 NOTE — Progress Notes (Signed)
Jessica Dunlap is a 64 y.o. female patient admitted. Awake, alert - oriented  X 4 - no acute distress noted.  VSS - Blood pressure 127/70, pulse 97, temperature 98.4 F (36.9 C), temperature source Skin, resp. rate 18, height 5\' 3"  (1.6 m), weight 59.4 kg, SpO2 99 %.    IV in place, occlusive dsg intact without redness.  Orientation to room, and floor completed.  Admission INP armband ID verified with patient/family, and in place.   SR up x 2, fall assessment complete, with patient and family able to verbalize understanding of risk associated with falls, and verbalized understanding to call nsg before up out of bed.  Call light within reach, patient able to voice, and demonstrate understanding. No evidence of skin break down noted on exam.  Dr. Donzetta Matters and Dr. Denton Brick notified of patient admission.      Will cont to eval and treat per MD orders.  Cyndi Bender, RN 02/27/2020 5:15 PM

## 2020-02-27 NOTE — TOC Initial Note (Addendum)
Transition of Care [facility]) - Initial/Assessment Note    Patient Details  Name: Jessica Dunlap MRN: [id number] Date of Birth: Aug 08, 1955  Transition of [facility]) CM/SW Contact:    Natasha Bence, LCSW Phone Number: 02/27/2020, 5:12 PM  Clinical Narrative:                 Patient is 65 year old female admitted for DKA type 2. CSW received a consult for [facility] and med assistance. CSW contacted patient's family to inquire about patient's ability to manage and afford her medication. Patient's sister reported that patient is able to manage her medication, but often does not check her blood sugar levels. Patient's sister reported that they have been looking to be set up with a [facility] agency prior to hospital admission, but have not been able to receive services due to nursing shortage. Family agreeable to [facility]. Patient's family looking to reaccess need for HHPT if foot is amputated or requires extensive surgery. TOC to follow.         Patient Goals and CMS Choice        Expected Discharge Plan and Services                                                Prior Living Arrangements/Services                       Activities of Daily Living      Permission Sought/Granted                  Emotional Assessment              Admission diagnosis:  DKA (diabetic ketoacidoses) ([place]) [E11.10] Lower extremity ulceration ([place]) [L97.909] Cellulitis of left lower extremity [L03.116] Acute respiratory failure with hypoxia ([place]) [J96.01] DKA, type 2 ([place]) [E11.10] Diabetic ketoacidosis without coma associated with type 2 diabetes mellitus ([place]) [E11.10] Diabetic ulcer of foot associated with diabetes mellitus due to underlying condition, limited to breakdown of skin ([place]) [W10.272, L97.501] Ulcer of left lower extremity, unspecified ulcer stage [facility]) [L97.929] Patient Active Problem List   Diagnosis Date Noted  . DKA (diabetic ketoacidoses) ([place]) 02/27/2020  . Diabetic ulcer  of foot associated with diabetes mellitus due to underlying condition, limited to breakdown of skin ([place]) 02/27/2020  . Diabetic infection of left foot ([place])   . Charcot's joint of left foot   . DKA, type 2 ([place]) 02/26/2020  . Diabetic foot ulcer ([place]) 02/26/2020  . Uncontrolled type 2 diabetes with neuropathy ([place])   . Hypothyroidism   . Hypertension   . Coronary artery disease   . Cancer ([place])   . Depression   . Cirrhosis, non-alcoholic ([place])   . Neuropathy    PCP:  Redmond School, MD Pharmacy:   [place], [address] [place] [address] Phone: [phone] Fax: [phone]  [place], [place] [address] [place] [address] Phone: [phone] Fax: [phone]     Social Determinants of Health (SDOH) Interventions    Readmission Risk Interventions No flowsheet data found.

## 2020-02-27 NOTE — Progress Notes (Signed)
TRH night shift.  The patient was transitioned to SQ Lantus and RI SS before meals per Endo tool guidelines.  Tennis Must, MD

## 2020-02-28 LAB — BASIC METABOLIC PANEL
Anion gap: 10 (ref 5–15)
BUN: <5 mg/dL — ABNORMAL LOW (ref 8–23)
CO2: 22 mmol/L (ref 22–32)
Calcium: 7.7 mg/dL — ABNORMAL LOW (ref 8.9–10.3)
Chloride: 100 mmol/L (ref 98–111)
Creatinine, Ser: 0.68 mg/dL (ref 0.44–1.00)
GFR calc Af Amer: >60 mL/min (ref >60)
GFR calc non Af Amer: >60 mL/min (ref >60)
Glucose, Bld: 321 mg/dL — ABNORMAL HIGH (ref 70–99)
Potassium: 3.6 mmol/L (ref 3.5–5.1)
Sodium: 132 mmol/L — ABNORMAL LOW (ref 135–145)

## 2020-02-28 LAB — GLUCOSE, CAPILLARY
Glucose-Capillary: 282 mg/dL — ABNORMAL HIGH (ref 70–99)
Glucose-Capillary: 224 mg/dL — ABNORMAL HIGH (ref 70–99)
Glucose-Capillary: 292 mg/dL — ABNORMAL HIGH (ref 70–99)
Glucose-Capillary: 260 mg/dL — ABNORMAL HIGH (ref 70–99)

## 2020-02-28 MED ORDER — INSULIN ASPART 100 UNIT/ML ~~LOC~~ SOLN
0.0000 [IU] | Freq: Three times a day (TID) | SUBCUTANEOUS | Status: DC
  Administered 2020-02-28: 5 [IU] via SUBCUTANEOUS
  Administered 2020-02-28: 8 [IU] via SUBCUTANEOUS
  Administered 2020-02-29: 2 [IU] via SUBCUTANEOUS
  Administered 2020-02-29: 3 [IU] via SUBCUTANEOUS
  Administered 2020-02-29: 5 [IU] via SUBCUTANEOUS
  Administered 2020-03-01 – 2020-03-02 (×3): 3 [IU] via SUBCUTANEOUS
  Administered 2020-03-02: 5 [IU] via SUBCUTANEOUS
  Administered 2020-03-02: 3 [IU] via SUBCUTANEOUS
  Administered 2020-03-03: 4 [IU] via SUBCUTANEOUS
  Administered 2020-03-03: 5 [IU] via SUBCUTANEOUS
  Administered 2020-03-03: 8 [IU] via SUBCUTANEOUS
  Administered 2020-03-04 (×2): 5 [IU] via SUBCUTANEOUS
  Administered 2020-03-04: 3 [IU] via SUBCUTANEOUS
  Administered 2020-03-05: 5 [IU] via SUBCUTANEOUS
  Administered 2020-03-05: 8 [IU] via SUBCUTANEOUS
  Administered 2020-03-05: 5 [IU] via SUBCUTANEOUS
  Administered 2020-03-06: 3 [IU] via SUBCUTANEOUS
  Administered 2020-03-06: 5 [IU] via SUBCUTANEOUS
  Administered 2020-03-06: 3 [IU] via SUBCUTANEOUS
  Administered 2020-03-07: 8 [IU] via SUBCUTANEOUS
  Administered 2020-03-07: 5 [IU] via SUBCUTANEOUS
  Administered 2020-03-07 – 2020-03-09 (×4): 3 [IU] via SUBCUTANEOUS
  Administered 2020-03-09: 2 [IU] via SUBCUTANEOUS
  Administered 2020-03-09: 5 [IU] via SUBCUTANEOUS
  Administered 2020-03-10: 3 [IU] via SUBCUTANEOUS
  Administered 2020-03-10: 2 [IU] via SUBCUTANEOUS
  Administered 2020-03-11 (×3): 3 [IU] via SUBCUTANEOUS
  Administered 2020-03-12: 2 [IU] via SUBCUTANEOUS
  Administered 2020-03-12: 1 [IU] via SUBCUTANEOUS
  Administered 2020-03-12: 2 [IU] via SUBCUTANEOUS
  Administered 2020-03-13: 3 [IU] via SUBCUTANEOUS
  Administered 2020-03-13: 2 [IU] via SUBCUTANEOUS

## 2020-02-28 MED ORDER — JUVEN PO PACK
1.0000 | PACK | Freq: Two times a day (BID) | ORAL | Status: DC
  Administered 2020-02-28 – 2020-03-06 (×8): 1 via ORAL
  Filled 2020-02-28 (×10): qty 1

## 2020-02-28 MED ORDER — ADULT MULTIVITAMIN W/MINERALS CH
1.0000 | ORAL_TABLET | Freq: Every day | ORAL | Status: DC
  Administered 2020-02-28 – 2020-03-13 (×15): 1 via ORAL
  Filled 2020-02-28 (×14): qty 1

## 2020-02-28 MED ORDER — INSULIN GLARGINE 100 UNIT/ML ~~LOC~~ SOLN
12.0000 [IU] | Freq: Every day | SUBCUTANEOUS | Status: DC
  Administered 2020-02-28: 12 [IU] via SUBCUTANEOUS
  Filled 2020-02-28 (×2): qty 0.12

## 2020-02-28 MED ORDER — INSULIN ASPART 100 UNIT/ML ~~LOC~~ SOLN
0.0000 [IU] | Freq: Every day | SUBCUTANEOUS | Status: DC
  Administered 2020-02-28: 3 [IU] via SUBCUTANEOUS
  Administered 2020-02-29 – 2020-03-07 (×5): 2 [IU] via SUBCUTANEOUS
  Administered 2020-03-08: 5 [IU] via SUBCUTANEOUS

## 2020-02-28 MED ORDER — ENSURE MAX PROTEIN PO LIQD
11.0000 [oz_av] | Freq: Every day | ORAL | Status: DC
  Administered 2020-02-28 – 2020-03-12 (×11): 11 [oz_av] via ORAL
  Filled 2020-02-28 (×16): qty 330

## 2020-02-28 NOTE — Progress Notes (Signed)
  Progress Note    02/28/2020 5:52 PM * No surgery found *  Subjective: No overnight issues, MRI has been completed  Vitals:   02/28/20 0558 02/28/20 1515  BP: 130/70 122/62  Pulse: 100 97  Resp: 18 17  Temp: 98.9 F (37.2 C) 98.8 F (37.1 C)  SpO2: 99% 98%    Physical Exam: Awake alert oriented No lab respirations Dressing left foot clean dry intact  CBC    Component Value Date/Time   WBC 16.0 (H) 02/26/2020 1725   RBC 3.69 (L) 02/26/2020 1725   HGB 11.4 (L) 02/26/2020 1725   HCT 32.8 (L) 02/26/2020 1725   PLT 222 02/26/2020 1725   MCV 88.9 02/26/2020 1725   MCH 30.9 02/26/2020 1725   MCHC 34.8 02/26/2020 1725   RDW 14.1 02/26/2020 1725   LYMPHSABS 0.5 (L) 02/26/2020 1725   MONOABS 0.9 02/26/2020 1725   EOSABS 0.0 02/26/2020 1725   BASOSABS 0.0 02/26/2020 1725    BMET    Component Value Date/Time   NA 132 (L) 02/28/2020 0107   K 3.6 02/28/2020 0107   CL 100 02/28/2020 0107   CO2 22 02/28/2020 0107   GLUCOSE 321 (H) 02/28/2020 0107   BUN <5 (L) 02/28/2020 0107   CREATININE 0.68 02/28/2020 0107   CALCIUM 7.7 (L) 02/28/2020 0107   GFRNONAA >60 02/28/2020 0107   GFRAA >60 02/28/2020 0107    INR    Component Value Date/Time   INR 1.95 (H) 09/06/2010 0549     Intake/Output Summary (Last 24 hours) at 02/28/2020 1752 Last data filed at 02/28/2020 0548 Gross per 24 hour  Intake 1393.24 ml  Output 1100 ml  Net 293.24 ml   MRI IMPRESSION: Area of ulceration overlying the lateral plantar base of the fifth metatarsal with a enhancing sinus tract and loculated fluid collection, likely consistent with abscess measuring 2.3 x 1.3 cm.  Findings suggestive of acute osteomyelitis involving the plantar base of the fifth metatarsal.  Probable incomplete nondisplaced stress fracture seen at the base of the fifth metatarsal with overlying periosteal reaction and mild reactive marrow.  Assessment/plan:  64 y.o. female is here with left foot infection.  MRI  demonstrates osteo and patient has significant varus deformity of her foot.  I have recommended below-knee amputation on the left given that I do not think she has good healing or functional capability of the left foot.  At this time patient is not agreeable states that she needs to go home to attend to her special needs child.  This is certainly understandable however I do think we are delaying the inevitable and I worry that she will have worsening infection.  Either way if she discharges home she will need packing of her foot daily and I can see her in 2 to 3 weeks to discuss amputation if she is not readmitted prior.   Jessica Dunlap C. Donzetta Matters, MD [facility] [place] Office: [phone] Pager: [phone]  02/28/2020 5:52 PM

## 2020-02-28 NOTE — Progress Notes (Signed)
PROGRESS NOTE    Jessica Dunlap  EEF:[id number] DOB: 24-Jul-1955 DOA: 02/26/2020 PCP: Redmond School, MD     Brief Narrative:  Jessica Dunlap is a 64 year old female with past medical history significant for type 2 diabetes, uncontrolled with hyperglycemia and neuropathy, hypertension, hypothyroidism, depression, CAD, nonalcoholic cirrhosis, who was admitted on 02/26/2019 due to left foot diabetic ulcer as well as DKA.  Patient was initially started on insulin drip which was transitioned further to subcutaneous Lantus and sliding scale insulin.  Vascular surgery as well as general surgery was consulted due to her left diabetic foot ulcer.  Patient was transferred to [facility] for further vascular surgery evaluation.  New events last 24 hours / Subjective: No new complaints, continues to have some pain in her left foot.  Assessment & Plan:   Principal Problem:   DKA, type 2 ([place]) Active Problems:   Uncontrolled type 2 diabetes with neuropathy ([name])   Hypothyroidism   Hypertension   Coronary artery disease   Cirrhosis, non-alcoholic (HCC)   Diabetic foot ulcer ([name])   DKA (diabetic ketoacidoses) ([name])   Diabetic ulcer of foot associated with diabetes mellitus due to underlying condition, limited to breakdown of skin ([name])   DKA -Hemoglobin A1c 6.7  -Transitioned off IV insulin -Lantus, NovoLog sliding scale -Appreciate diabetic coordinator  Left diabetic foot ulcer/osteomyelitis  -Failed outpatient oral antibiotic treatment, Augmentin -Wound culture from 02/18/2020 with group B strept agalactiae -Wound culture from 02/26/2020 pending -Continue Rocephin, Flagyl -MRI revealed lateral plantar base of the fifth metatarsal fluid collection likely abscess as well as acute osteomyelitis involving plantar base of fifth metatarsal -ABI normal bilaterally -Vascular surgery following for possible debridement versus proximal amputation  Hypothyroidism -Continue  Synthroid  CAD -Continue aspirin, metoprolol  Essential hypertension -Continue metoprolol, Cardizem   Chronic pain syndrome -Continue MS Contin, dilaudid. Reviewed PDMP today     DVT prophylaxis:  enoxaparin (LOVENOX) injection 40 mg Start: 02/27/20 1000  Code Status: Full code Family Communication: No family at bedside Disposition Plan:   Status is: Inpatient  Remains inpatient appropriate because:IV treatments appropriate due to intensity of illness or inability to take PO and Inpatient level of care appropriate due to severity of illness   Dispo: The patient is from: Home              Anticipated d/c is to: Home              Anticipated d/c date is: 2 days              Patient currently is not medically stable to d/c.  Remains on IV antibiotics, continued treatment for diabetic foot ulcer, debridement versus amputation    Consultants:   General surgery  Vascular surgery   Antimicrobials:  Anti-infectives (From admission, onward)   Start     Dose/Rate Route Frequency Ordered Stop   02/26/20 2220  cefTRIAXone (ROCEPHIN) 2 g in sodium chloride 0.9 % 100 mL IVPB       "And" Linked Group Details   2 g 200 mL/hr over 30 Minutes Intravenous Every 24 hours 02/26/20 2220     02/26/20 2220  metroNIDAZOLE (FLAGYL) IVPB 500 mg       "And" Linked Group Details   500 mg 100 mL/hr over 60 Minutes Intravenous Every 8 hours 02/26/20 2220     02/26/20 1715  piperacillin-tazobactam (ZOSYN) IVPB 3.375 g        3.375 g 100 mL/hr over 30 Minutes Intravenous  Once  02/26/20 1701 02/26/20 1809        Objective: Vitals:   02/27/20 1557 02/27/20 1700 02/27/20 2013 02/28/20 0558  BP:  127/70 134/66 130/70  Pulse:  97 (!) 106 100  Resp: 16 18 18 18   Temp: (!) 97.4 F (36.3 C) 98.4 F (36.9 C) 98.8 F (37.1 C) 98.9 F (37.2 C)  TempSrc: Oral Skin Oral Oral  SpO2:  99% 100% 99%  Weight:      Height:        Intake/Output Summary (Last 24 hours) at 02/28/2020 1158 Last  data filed at 02/28/2020 0548 Gross per 24 hour  Intake 1393.24 ml  Output 1100 ml  Net 293.24 ml   Filed Weights   02/26/20 1645  Weight: 59.4 kg    Examination:  General exam: Appears calm and comfortable  Respiratory system: Clear to auscultation. Respiratory effort normal. No respiratory distress. No conversational dyspnea.  Cardiovascular system: S1 & S2 heard, RRR. No murmurs. No pedal edema. Gastrointestinal system: Abdomen is nondistended, soft and nontender. Normal bowel sounds heard. Central nervous system: Alert and oriented. No focal neurological deficits. Speech clear.  Extremities: Symmetric in appearance  Skin: Left foot with dressing in place, erythema and warmth extending proximally  Psychiatry: Judgement and insight appear normal. Mood & affect appropriate.   Data Reviewed: I have personally reviewed following labs and imaging studies  CBC: Recent Labs  Lab 02/26/20 1725  WBC 16.0*  NEUTROABS 14.4*  HGB 11.4*  HCT 32.8*  MCV 88.9  PLT 242   Basic Metabolic Panel: Recent Labs  Lab 02/26/20 1725 02/26/20 2322 02/27/20 0548 02/28/20 0107  NA 128* 135 133* 132*  K 3.6 3.4* 3.9 3.6  CL 91* 101 101 100  CO2 21* 24 21* 22  GLUCOSE 367* 162* 252* 321*  BUN 13 10 9  <5*  CREATININE 0.83 0.72 0.62 0.68  CALCIUM 8.9 8.2* 8.0* 7.7*   GFR: Estimated Creatinine Clearance: 59.5 mL/min (by C-G formula based on SCr of 0.68 mg/dL). Liver Function Tests: Recent Labs  Lab 02/26/20 1725  AST 14*  ALT 11  ALKPHOS 61  BILITOT 1.1  PROT 8.3*  ALBUMIN 3.9   No results for input(s): LIPASE, AMYLASE in the last 168 hours. No results for input(s): AMMONIA in the last 168 hours. Coagulation Profile: No results for input(s): INR, PROTIME in the last 168 hours. Cardiac Enzymes: No results for input(s): CKTOTAL, CKMB, CKMBINDEX, TROPONINI in the last 168 hours. BNP (last 3 results) No results for input(s): PROBNP in the last 8760 hours. HbA1C: Recent Labs     02/26/20 2322 02/27/20 0548  HGBA1C 6.7* 6.7*   CBG: Recent Labs  Lab 02/27/20 0852 02/27/20 1259 02/27/20 1718 02/27/20 2101 02/28/20 0734  GLUCAP 243* 222* 220* 259* 282*   Lipid Profile: No results for input(s): CHOL, HDL, LDLCALC, TRIG, CHOLHDL, LDLDIRECT in the last 72 hours. Thyroid Function Tests: No results for input(s): TSH, T4TOTAL, FREET4, T3FREE, THYROIDAB in the last 72 hours. Anemia Panel: No results for input(s): VITAMINB12, FOLATE, FERRITIN, TIBC, IRON, RETICCTPCT in the last 72 hours. Sepsis Labs: Recent Labs  Lab 02/26/20 1725  LATICACIDVEN 3.9*    Recent Results (from the past 240 hour(s))  Aerobic Culture (superficial specimen)     Status: None   Collection Time: 02/18/20 12:30 PM   Specimen: Wound  Result Value Ref Range Status   Specimen Description   Final    WOUND LEFT LATERAL FOOT Performed at [facility]  [address] [place], [place] [address]    Special Requests   Final    NONE Performed at [facility], [place] [address], [place], [place] [address]    Gram Stain   Final    MODERATE WBC PRESENT, PREDOMINANTLY PMN ABUNDANT GRAM NEGATIVE COCCI ABUNDANT GRAM NEGATIVE RODS FEW GRAM POSITIVE RODS    Culture   Final    ABUNDANT GROUP B STREP(S.AGALACTIAE)ISOLATED TESTING AGAINST S. AGALACTIAE NOT ROUTINELY PERFORMED DUE TO PREDICTABILITY OF AMP/PEN/VAN SUSCEPTIBILITY. ABUNDANT STREPTOCOCCUS ANGINOSIS CULTURE REINCUBATED FOR BETTER GROWTH Performed at [facility], [place] [address] [place], [place] [address]    Report Status 02/23/2020 FINAL  Final   Organism ID, Bacteria STREPTOCOCCUS ANGINOSIS  Final      Susceptibility   Streptococcus anginosis - MIC*    PENICILLIN <=0.06 SENSITIVE Sensitive     CEFTRIAXONE 0.25 SENSITIVE Sensitive     ERYTHROMYCIN <=0.12 SENSITIVE Sensitive     LEVOFLOXACIN 1 SENSITIVE Sensitive     VANCOMYCIN 0.5 SENSITIVE Sensitive     * ABUNDANT STREPTOCOCCUS  ANGINOSIS  Wound or Superficial Culture     Status: None (Preliminary result)   Collection Time: 02/26/20  5:01 PM   Specimen: Ulcer; Wound  Result Value Ref Range Status   Specimen Description   Final    ULCER Performed at [facility], [address] [place], [place] [address]    Special Requests   Final    NONE Performed at [place] [place], [place] [address]    Gram Stain   Final    FEW WBC PRESENT, PREDOMINANTLY PMN FEW GRAM NEGATIVE RODS RARE GRAM POSITIVE COCCI IN CLUSTERS    Culture   Final    CULTURE REINCUBATED FOR BETTER GROWTH Performed at [facility], [place] [address], [place], [place] [address]    Report Status PENDING  Incomplete  SARS Coronavirus 2 by RT PCR (hospital order, performed in [facility] hospital lab) Nasopharyngeal Nasopharyngeal Swab     Status: None   Collection Time: 02/26/20  6:22 PM   Specimen: Nasopharyngeal Swab  Result Value Ref Range Status   SARS Coronavirus 2 NEGATIVE NEGATIVE Final    Comment: (NOTE) SARS-CoV-2 target nucleic acids are NOT DETECTED.  The SARS-CoV-2 RNA is generally detectable in upper and lower respiratory specimens during the acute phase of infection. The lowest concentration of SARS-CoV-2 viral copies this assay can detect is 250 copies / mL. A negative result does not preclude SARS-CoV-2 infection and should not be used as the sole basis for treatment or other patient management decisions.  A negative result may occur with improper specimen collection / handling, submission of specimen other than nasopharyngeal swab, presence of viral mutation(s) within the areas targeted by this assay, and inadequate number of viral copies (<250 copies / mL). A negative result must be combined with clinical observations, patient history, and epidemiological information.  Fact Sheet for Patients:   StrictlyIdeas.no  Fact Sheet for Healthcare  Providers: BankingDealers.co.za  This test is not yet approved or  cleared by the [place] FDA and has been authorized for detection and/or diagnosis of SARS-CoV-2 by FDA under an Emergency Use Authorization (EUA).  This EUA will remain in effect (meaning this test can be used) for the duration of the COVID-19 declaration under Section 564(b)(1) of the Act, 21 U.S.C. section 360bbb-3(b)(1), unless the authorization is terminated or revoked sooner.  Performed at [facility], [address] [place], [place] [address]   Blood  Cultures x 2 sites     Status: None (Preliminary result)   Collection Time: 02/26/20 11:22 PM   Specimen: BLOOD  Result Value Ref Range Status   Specimen Description BLOOD LEFT ANTECUBITAL  Final   Special Requests   Final    BOTTLES DRAWN AEROBIC AND ANAEROBIC Blood Culture adequate volume   Culture   Final    NO GROWTH 2 DAYS Performed at [facility], [address] [place],  [place] [address]    Report Status PENDING  Incomplete  Blood Cultures x 2 sites     Status: None (Preliminary result)   Collection Time: 02/26/20 11:33 PM   Specimen: BLOOD LEFT HAND  Result Value Ref Range Status   Specimen Description BLOOD LEFT HAND  Final   Special Requests AEROBIC BOTTLE ONLY Blood Culture adequate volume  Final   Culture   Final    NO GROWTH 2 DAYS Performed at [facility], [address], [place], [place] [address]    Report Status PENDING  Incomplete      Radiology Studies: DG Tibia/Fibula Left  Result Date: 02/26/2020 CLINICAL DATA:  Left lower extremity redness. EXAM: LEFT TIBIA AND FIBULA - 2 VIEW COMPARISON:  None. FINDINGS: There is no evidence of fracture or other focal bone lesions. Mild soft tissue swelling is seen along the anterior aspect of the distal left tibia. This is seen on the lateral view. IMPRESSION: Mild soft tissue swelling without evidence of an acute osseous abnormality. Electronically Signed   By: Virgina Norfolk M.D.   On: 02/26/2020 17:33   DG Ankle Complete Left  Result Date: 02/26/2020 CLINICAL DATA:  Left foot ulcer with left tib/fib redness. EXAM: LEFT ANKLE COMPLETE - 3+ VIEW COMPARISON:  None. FINDINGS: There is no evidence of fracture, dislocation, or joint effusion. Mild degenerative changes seen along the dorsal aspect of the mid left foot. Moderate severity soft tissue swelling is noted along the medial aspect of the left ankle. Moderate severity soft tissue swelling of the dorsal aspect of the mid left foot is also noted. IMPRESSION: 1. Moderate severity medial soft tissue swelling without evidence of acute fracture. 2. Mild degenerative changes without evidence of an acute osseous abnormality. Electronically Signed   By: Virgina Norfolk M.D.   On: 02/26/2020 17:35   MR FOOT LEFT W WO CONTRAST  Result Date: 02/27/2020 CLINICAL DATA:  Foot ulcer, question of osteomyelitis EXAM: MRI OF THE LEFT FOREFOOT WITHOUT AND WITH CONTRAST TECHNIQUE: Multiplanar, multisequence MR imaging of the left foot was performed both before and after administration of intravenous contrast. CONTRAST:  46mL GADAVIST GADOBUTROL 1 MMOL/ML IV SOLN COMPARISON:  None. FINDINGS: Bones/Joint/Cartilage There is mildly increased T2 hyperintense signal with enhancement and T1 hypointensity with cortical irregularity seen at the plantar base of the fifth metatarsal. There is also a probable thin incomplete nondisplaced fracture seen at the fifth metatarsal base with overlying periosteal reaction and mildly increased STIR signal. There is mild joint space loss seen at the tarsal metatarsal joints with subchondral cystic changes. There is scattered T2 hyperintense signal seen throughout the calcaneus and anterior talus without T1 hypointense no large joint effusions are noted. Ligaments The Lisfranc ligaments are intact. Muscles and Tendons Diffusely increased STIR signal with fatty infiltration is noted throughout the muscles of the  forefoot, likely from chronic denervation atrophy. The flexor and extensor tendons appear to be intact. The visualized portion of the plantar fascia is intact. Soft tissues There is a focal area of ulceration seen  along the lateral aspect of the midfoot measuring approximately 1 cm in transverse dimension with a peripherally enhancing sinus tract extending to the base of the fifth metatarsal where there is a peripherally enhancing loculated fluid collection which measures approximately 2.3 x 1.3 cm in dimension surrounding the base of the fifth metatarsal. IMPRESSION: Area of ulceration overlying the lateral plantar base of the fifth metatarsal with a enhancing sinus tract and loculated fluid collection, likely consistent with abscess measuring 2.3 x 1.3 cm. Findings suggestive of acute osteomyelitis involving the plantar base of the fifth metatarsal. Probable incomplete nondisplaced stress fracture seen at the base of the fifth metatarsal with overlying periosteal reaction and mild reactive marrow. Electronically Signed   By: Prudencio Pair M.D.   On: 02/27/2020 23:59   US ARTERIAL ABI (SCREENING LOWER EXTREMITY)  Result Date: 02/27/2020 CLINICAL DATA:  Infected diabetic left foot ulcer. Redness with cellulitis. EXAM: NONINVASIVE PHYSIOLOGIC VASCULAR STUDY OF BILATERAL LOWER EXTREMITIES TECHNIQUE: Evaluation of both lower extremities were performed at rest, including calculation of ankle-brachial indices with single level Doppler, pressure and pulse volume recording. COMPARISON:  09/14/2019 FINDINGS: Right ABI: 1.12, previously 0.94 Left ABI:  1.06, previously 0.95 Right Lower Extremity: Biphasic Doppler waveforms in the right posterior tibial artery and similar to the previous examination. Primarily monophasic waveforms in the right dorsalis pedis artery. Left Lower Extremity: Biphasic Doppler waveforms in left dorsalis pedis artery. Difficult to characterize waveforms in left posterior tibial artery. 1.0-1.4  Normal IMPRESSION: Normal ankle-brachial indices bilaterally. Minimal change from the prior examination. Electronically Signed   By: Markus Daft M.D.   On: 02/27/2020 10:35   DG Foot Complete Left  Result Date: 02/26/2020 CLINICAL DATA:  Left foot ulcer. EXAM: LEFT FOOT - COMPLETE 3+ VIEW COMPARISON:  None. FINDINGS: There is no evidence of an acute fracture or dislocation. Mild degenerative changes are seen along the dorsal aspect of the mid left foot. There is moderate severity soft tissue swelling of the dorsal aspect of the distal left foot. A 1.3 cm x 0.7 cm superficial soft tissue defect is seen along the lateral aspect of the mid left foot. This is adjacent to the base of the fifth left metatarsal. Mild-to-moderate severity focal soft tissue swelling is also seen within this region. IMPRESSION: 1. Soft tissue ulceration along the lateral aspect of the mid left foot with associated soft tissue swelling. 2. No acute fracture or acute osteomyelitis. 3. Mild degenerative changes, as described above. Electronically Signed   By: Virgina Norfolk M.D.   On: 02/26/2020 17:37   DG Toe Great Right  Result Date: 02/26/2020 CLINICAL DATA:  Left foot ulcer. EXAM: RIGHT GREAT TOE COMPARISON:  None. FINDINGS: There is no evidence of an acute fracture or dislocation. A chronic appearing deformity is seen involving the tuft of the distal phalanx of the right great toe. Moderate severity diffuse soft tissue swelling is noted. IMPRESSION: Soft tissue swelling without evidence of acute osteomyelitis. MRI correlation is recommended if this remains of clinical concern. Electronically Signed   By: Virgina Norfolk M.D.   On: 02/26/2020 17:32      Scheduled Meds:  aspirin EC  81 mg Oral Daily   carbamazepine  100 mg Oral BID   diltiazem  120 mg Oral Daily   DULoxetine  30 mg Oral QHS   DULoxetine  60 mg Oral QHS   enoxaparin (LOVENOX) injection  40 mg Subcutaneous Q24H   HYDROmorphone  16 mg Oral BID    HYDROmorphone  24  mg Oral Daily   insulin aspart  0-15 Units Subcutaneous TID WC   insulin aspart  0-5 Units Subcutaneous QHS   insulin glargine  12 Units Subcutaneous QHS   levothyroxine  25 mcg Oral Q0600   metoprolol tartrate  50 mg Oral BID   morphine  30 mg Oral QHS   traZODone  150 mg Oral QHS   Continuous Infusions:  sodium chloride 75 mL/hr at 02/28/20 0400   cefTRIAXone (ROCEPHIN)  IV Stopped (02/27/20 2204)   And   metronidazole 500 mg (02/28/20 0906)     LOS: 2 days      Time spent: 40 minutes   Dessa Phi, DO [facility] 02/28/2020, 11:58 AM   Available via Epic secure chat 7am-7pm After these hours, please refer to coverage provider listed on [email or web address]

## 2020-02-28 NOTE — Progress Notes (Signed)
Initial Nutrition Assessment  DOCUMENTATION CODES:   Not applicable  INTERVENTION:   -MVI with minerals daily -1 packet Juven BID, each packet provides 95 calories, 2.5 grams of protein (collagen), and 9.8 grams of carbohydrate (3 grams sugar); also contains 7 grams of L-arginine and L-glutamine, 300 mg vitamin C, 15 mg vitamin E, 1.2 mcg vitamin B-12, 9.5 mg zinc, 200 mg calcium, and 1.5 g  Calcium Beta-hydroxy-Beta-methylbutyrate to support wound healing -Ensure Max po daily, each supplement provides 150 kcal and 30 grams of protein   NUTRITION DIAGNOSIS:   Increased nutrient needs related to wound healing as evidenced by estimated needs.  GOAL:   Patient will meet greater than or equal to 90% of their needs  MONITOR:   PO intake, Supplement acceptance, Diet advancement, Labs, Weight trends, Skin, I & O's  REASON FOR ASSESSMENT:   Consult Wound healing  ASSESSMENT:   Jessica Dunlap is a 64 y.o. female with a history of Diabetes type 2 with neuropathy, hypertension, hypothyroidism, depression, h/o cervical cancer. Has been followed for chronic foot ulceration by wound care.  Pt admitted with DKA and diabetic foot ulcers.   Reviewed I/O's: +393 ml x 24 hours and +3.2 L since admission  UOP: 1.1 L x 24 hours  Spoke with pt at bedside, who reports feeling better today. She states her appetite is fair and she usually consumes 3 small meals PTA (Breakfast: oatmeal; Lunch: fruit and yogurt; Dinner: TV dinner). Pt consumes mostly water, but admits to having one can of regular soda daily.   She denies any weight loss, reporting UBW is around 130-135#.   Pt shares that her foot ulcers developed about 1.5 months ago and was followed by the wound center. Her dressing changes helped, however, pt shares that she has severe neuropathy. She was not taking any vitamins or supplements PTA.   RD discussed importance of good meal and supplement intake to promote healing. She is amenable to  supplements.   Medication reviewed and include 0.9% sodium chloride infusion @ 75 ml/hr.   Lab Results  Component Value Date   HGBA1C 6.7 (H) 02/27/2020   PTA DM medications are 100 mg canagliflozin daily, 100 mg metformin daily, and 10 mg glipizide daily.   Labs reviewed: Na: 132, CBGS: 220-259 (inpatient orders for glycemic control are 0-15 units inuslin aspart TID with meals and 6 unit sinuslin glargine daily at bedtime).   NUTRITION - FOCUSED PHYSICAL EXAM:    Most Recent Value  Orbital Region No depletion  Upper Arm Region No depletion  Thoracic and Lumbar Region No depletion  Buccal Region No depletion  Temple Region No depletion  Clavicle Bone Region No depletion  Clavicle and Acromion Bone Region No depletion  Scapular Bone Region No depletion  Dorsal Hand No depletion  Patellar Region No depletion  Anterior Thigh Region No depletion  Posterior Calf Region No depletion  Edema (RD Assessment) None  Hair Reviewed  Eyes Reviewed  Mouth Reviewed  Skin Reviewed  Nails Reviewed       Diet Order:   Diet Order            Diet Carb Modified Fluid consistency: Thin; Room service appropriate? Yes  Diet effective now                 EDUCATION NEEDS:   Education needs have been addressed  Skin:  Skin Assessment: Skin Integrity Issues: Skin Integrity Issues:: Diabetic Ulcer Diabetic Ulcer: chronic, non healing wound on lt foot  secondary to charcot foot; chronic non-healing wounf on rt first metatarsal head  Last BM:  02/27/20  Height:   Ht Readings from Last 1 Encounters:  02/26/20 5\' 3"  (1.6 m)    Weight:   Wt Readings from Last 1 Encounters:  02/26/20 59.4 kg    Ideal Body Weight:  52.3 kg  BMI:  Body mass index is 23.21 kg/m.  Estimated Nutritional Needs:   Kcal:  1586-8257  Protein:  90-105 grams  Fluid:  > 1.7 L    Loistine Chance, RD, LDN, [place] Registered Dietitian II Certified Diabetes Care and Education Specialist Please refer to [facility]  for RD and/or RD on-call/weekend/after hours pager

## 2020-02-28 NOTE — Progress Notes (Signed)
Inpatient Diabetes Program Recommendations  AACE/ADA: New Consensus Statement on Inpatient Glycemic Control (2015)  Target Ranges:  Prepandial:   less than 140 mg/dL      Peak postprandial:   less than 180 mg/dL (1-2 hours)      Critically ill patients:  140 - 180 mg/dL   Lab Results  Component Value Date   GLUCAP 282 (H) 02/28/2020   HGBA1C 6.7 (H) 02/27/2020    Review of Glycemic Control Results for PATTI, SHORB (MRN [id number]) as of 02/28/2020 09:11  Ref. Range 02/27/2020 08:52 02/27/2020 12:59 02/27/2020 17:18 02/27/2020 21:01 02/28/2020 07:34  Glucose-Capillary Latest Ref Range: 70 - 99 mg/dL 243 (H)  Novolog 5units 222 (H)  Novolog 5units 220 (H)  Novolog 5units 259 (H)  Lantus 6units 282 (H)  Novolog 8units   Diabetes history:  DM2  Outpatient Diabetes medications:  Glipizide 10 mg daily Metformin 1000 mg bid  Current orders for Inpatient glycemic control:  Lantus 6 units qhs Novolog 0-15 units tid  Inpatient Diabetes Program Recommendations:    Lantus 12 units qhs Novolog 0-5 units  Will continue to follow while inpatient.  Thank you, Reche Dixon, RN, BSN Diabetes Coordinator Inpatient Diabetes Program [phone] (team pager from 8a-5p)

## 2020-02-29 ENCOUNTER — Encounter (HOSPITAL_BASED_OUTPATIENT_CLINIC_OR_DEPARTMENT_OTHER): Payer: 59 | Admitting: Internal Medicine

## 2020-02-29 LAB — BASIC METABOLIC PANEL
Anion gap: 10 (ref 5–15)
BUN: <5 mg/dL — ABNORMAL LOW (ref 8–23)
CO2: 21 mmol/L — ABNORMAL LOW (ref 22–32)
Calcium: 8.2 mg/dL — ABNORMAL LOW (ref 8.9–10.3)
Chloride: 105 mmol/L (ref 98–111)
Creatinine, Ser: 0.88 mg/dL (ref 0.44–1.00)
GFR calc Af Amer: >60 mL/min (ref >60)
GFR calc non Af Amer: >60 mL/min (ref >60)
Glucose, Bld: 224 mg/dL — ABNORMAL HIGH (ref 70–99)
Potassium: 3.9 mmol/L (ref 3.5–5.1)
Sodium: 136 mmol/L (ref 135–145)

## 2020-02-29 LAB — CBC
HCT: 23.5 % — ABNORMAL LOW (ref 36.0–46.0)
Hemoglobin: 7.8 g/dL — ABNORMAL LOW (ref 12.0–15.0)
MCH: 30.1 pg (ref 26.0–34.0)
MCHC: 33.2 g/dL (ref 30.0–36.0)
MCV: 90.7 fL (ref 80.0–100.0)
Platelets: 163 10*3/uL (ref 150–400)
RBC: 2.59 MIL/uL — ABNORMAL LOW (ref 3.87–5.11)
RDW: 13.9 % (ref 11.5–15.5)
WBC: 4.3 10*3/uL (ref 4.0–10.5)
nRBC: 0 % (ref 0.0–0.2)

## 2020-02-29 LAB — AEROBIC CULTURE W GRAM STAIN (SUPERFICIAL SPECIMEN)

## 2020-02-29 LAB — GLUCOSE, CAPILLARY
Glucose-Capillary: 196 mg/dL — ABNORMAL HIGH (ref 70–99)
Glucose-Capillary: 224 mg/dL — ABNORMAL HIGH (ref 70–99)
Glucose-Capillary: 146 mg/dL — ABNORMAL HIGH (ref 70–99)
Glucose-Capillary: 217 mg/dL — ABNORMAL HIGH (ref 70–99)

## 2020-02-29 MED ORDER — INSULIN GLARGINE 100 UNIT/ML ~~LOC~~ SOLN
12.0000 [IU] | Freq: Every day | SUBCUTANEOUS | Status: DC
  Administered 2020-02-29 – 2020-03-01 (×2): 12 [IU] via SUBCUTANEOUS
  Filled 2020-02-29 (×3): qty 0.12

## 2020-02-29 MED ORDER — INSULIN ASPART 100 UNIT/ML ~~LOC~~ SOLN
3.0000 [IU] | Freq: Three times a day (TID) | SUBCUTANEOUS | Status: DC
  Administered 2020-02-29 – 2020-03-02 (×4): 3 [IU] via SUBCUTANEOUS

## 2020-02-29 MED ORDER — INSULIN GLARGINE 100 UNIT/ML ~~LOC~~ SOLN: 16.0000 [IU] | Freq: Every day | SUBCUTANEOUS | Status: DC

## 2020-02-29 MED ORDER — LEVOTHYROXINE SODIUM 100 MCG PO TABS
200.0000 ug | ORAL_TABLET | Freq: Every day | ORAL | Status: DC
  Administered 2020-03-01 – 2020-03-13 (×13): 200 ug via ORAL
  Filled 2020-02-29 (×13): qty 2

## 2020-02-29 NOTE — Anesthesia Preprocedure Evaluation (Addendum)
Anesthesia Evaluation  Patient identified by MRN, date of birth, ID band Patient awake    Reviewed: Allergy & Precautions, NPO status , Patient's Chart, lab work & pertinent test results  Airway Mallampati: II  TM Distance: >3 FB     Dental   Pulmonary former smoker,    breath sounds clear to auscultation       Cardiovascular hypertension, + CAD   Rhythm:Regular Rate:Normal     Neuro/Psych    GI/Hepatic negative GI ROS, Neg liver ROS,   Endo/Other  diabetesHypothyroidism   Renal/GU      Musculoskeletal   Abdominal   Peds  Hematology  (+) anemia ,   Anesthesia Other Findings   Reproductive/Obstetrics                            Anesthesia Physical Anesthesia Plan  ASA: III  Anesthesia Plan: General   Post-op Pain Management:    Induction: Intravenous  PONV Risk Score and Plan: Ondansetron, Dexamethasone and Midazolam  Airway Management Planned: LMA  Additional Equipment:   Intra-op Plan:   Post-operative Plan: Extubation in OR  Informed Consent: I have reviewed the patients History and Physical, chart, labs and discussed the procedure including the risks, benefits and alternatives for the proposed anesthesia with the patient or authorized representative who has indicated his/her understanding and acceptance.     Dental advisory given  Plan Discussed with: CRNA and Anesthesiologist  Anesthesia Plan Comments:         Anesthesia Quick Evaluation

## 2020-02-29 NOTE — Progress Notes (Addendum)
  Progress Note    02/29/2020 7:47 AM * No surgery found *  Subjective:  No complaints.  Patient would like to have amputation during current hospital stay   Vitals:   02/28/20 2109 02/29/20 0450  BP: 137/69 130/70  Pulse: (!) 102 100  Resp: 18 18  Temp: 99 F (37.2 C) 98.9 F (37.2 C)  SpO2: 100% 100%   Physical Exam: Lungs:  Non labored Extremities:  Dressing left in place L foot Neurologic: A&O  CBC    Component Value Date/Time   WBC 4.3 02/29/2020 0148   RBC 2.59 (L) 02/29/2020 0148   HGB 7.8 (L) 02/29/2020 0148   HCT 23.5 (L) 02/29/2020 0148   PLT 163 02/29/2020 0148   MCV 90.7 02/29/2020 0148   MCH 30.1 02/29/2020 0148   MCHC 33.2 02/29/2020 0148   RDW 13.9 02/29/2020 0148   LYMPHSABS 0.5 (L) 02/26/2020 1725   MONOABS 0.9 02/26/2020 1725   EOSABS 0.0 02/26/2020 1725   BASOSABS 0.0 02/26/2020 1725    BMET    Component Value Date/Time   NA 136 02/29/2020 0148   K 3.9 02/29/2020 0148   CL 105 02/29/2020 0148   CO2 21 (L) 02/29/2020 0148   GLUCOSE 224 (H) 02/29/2020 0148   BUN <5 (L) 02/29/2020 0148   CREATININE 0.88 02/29/2020 0148   CALCIUM 8.2 (L) 02/29/2020 0148   GFRNONAA >60 02/29/2020 0148   GFRAA >60 02/29/2020 0148    INR    Component Value Date/Time   INR 1.95 (H) 09/06/2010 0549     Intake/Output Summary (Last 24 hours) at 02/29/2020 0747 Last data filed at 02/28/2020 2109 Gross per 24 hour  Intake --  Output 400 ml  Net -400 ml     Assessment/Plan:  64 y.o. female with osteomyelitis L foot   Patient now prefers to have amputation during current admission Plan will be for left BKA tomorrow likely with Dr. Donnetta Hutching NPO past midnight Consent    Dagoberto Ligas, PA-C [facility] [phone] 02/29/2020 7:47 AM   I have independently interviewed and examined patient and agree with PA assessment and plan above.  Patient now agreeable to below-knee amputation we will plan for tomorrow.  [place] C. Donzetta Matters,  MD [facility] [place] Office: [phone] Pager: [phone]

## 2020-02-29 NOTE — H&P (View-Only) (Signed)
  Progress Note    02/29/2020 7:47 AM * No surgery found *  Subjective:  No complaints.  Patient would like to have amputation during current hospital stay   Vitals:   02/28/20 2109 02/29/20 0450  BP: 137/69 130/70  Pulse: (!) 102 100  Resp: 18 18  Temp: 99 F (37.2 C) 98.9 F (37.2 C)  SpO2: 100% 100%   Physical Exam: Lungs:  Non labored Extremities:  Dressing left in place L foot Neurologic: A&O  CBC    Component Value Date/Time   WBC 4.3 02/29/2020 0148   RBC 2.59 (L) 02/29/2020 0148   HGB 7.8 (L) 02/29/2020 0148   HCT 23.5 (L) 02/29/2020 0148   PLT 163 02/29/2020 0148   MCV 90.7 02/29/2020 0148   MCH 30.1 02/29/2020 0148   MCHC 33.2 02/29/2020 0148   RDW 13.9 02/29/2020 0148   LYMPHSABS 0.5 (L) 02/26/2020 1725   MONOABS 0.9 02/26/2020 1725   EOSABS 0.0 02/26/2020 1725   BASOSABS 0.0 02/26/2020 1725    BMET    Component Value Date/Time   NA 136 02/29/2020 0148   K 3.9 02/29/2020 0148   CL 105 02/29/2020 0148   CO2 21 (L) 02/29/2020 0148   GLUCOSE 224 (H) 02/29/2020 0148   BUN <5 (L) 02/29/2020 0148   CREATININE 0.88 02/29/2020 0148   CALCIUM 8.2 (L) 02/29/2020 0148   GFRNONAA >60 02/29/2020 0148   GFRAA >60 02/29/2020 0148    INR    Component Value Date/Time   INR 1.95 (H) 09/06/2010 0549     Intake/Output Summary (Last 24 hours) at 02/29/2020 0747 Last data filed at 02/28/2020 2109 Gross per 24 hour  Intake --  Output 400 ml  Net -400 ml     Assessment/Plan:  64 y.o. female with osteomyelitis L foot   Patient now prefers to have amputation during current admission Plan will be for left BKA tomorrow likely with Dr. Donnetta Hutching NPO past midnight Consent    Dagoberto Ligas, PA-C [facility] [phone] 02/29/2020 7:47 AM   I have independently interviewed and examined patient and agree with PA assessment and plan above.  Patient now agreeable to below-knee amputation we will plan for tomorrow.  [place]ina [place] C. Donzetta Matters,  MD [facility] [place] Office: [phone] Pager: [phone]

## 2020-02-29 NOTE — Progress Notes (Signed)
PROGRESS NOTE    Jessica Dunlap  JIR:[id number] DOB: Oct 18, 1955 DOA: 02/26/2020 PCP: Redmond School, MD     Brief Narrative:  Jessica Dunlap is a 64 year old female with past medical history significant for type 2 diabetes, uncontrolled with hyperglycemia and neuropathy, hypertension, hypothyroidism, depression, CAD, nonalcoholic cirrhosis, who was admitted on 02/26/2019 due to left foot diabetic ulcer as well as DKA.  Patient was initially started on insulin drip which was transitioned further to subcutaneous Lantus and sliding scale insulin.  Vascular surgery as well as general surgery was consulted due to her left diabetic foot ulcer.  Patient was transferred to [facility] for further vascular surgery evaluation.  New events last 24 hours / Subjective: She is now agreeable to left BKA.  No other complaints on examination today.  Assessment & Plan:   Principal Problem:   DKA, type 2 ([place]) Active Problems:   Uncontrolled type 2 diabetes with neuropathy ([place])   Hypothyroidism   Hypertension   Coronary artery disease   Cirrhosis, non-alcoholic (HCC)   Diabetic foot ulcer ([place])   DKA (diabetic ketoacidoses) ([place])   Diabetic ulcer of foot associated with diabetes mellitus due to underlying condition, limited to breakdown of skin ([place])   DKA -Hemoglobin A1c 6.7  -Transitioned off IV insulin -Lantus, NovoLog sliding scale. Dose increased today  -Appreciate diabetic coordinator  Left diabetic foot ulcer/osteomyelitis  -Failed outpatient oral antibiotic treatment, Augmentin -Wound culture from 02/18/2020 with group B strept agalactiae -Wound culture from 02/26/2020 pending -Continue Rocephin, Flagyl -MRI revealed lateral plantar base of the fifth metatarsal fluid collection likely abscess as well as acute osteomyelitis involving plantar base of fifth metatarsal -ABI normal bilaterally -Vascular surgery planning for BKA 9/1  Hypothyroidism -Continue Synthroid  CAD -Continue  aspirin, metoprolol  Essential hypertension -Continue metoprolol, Cardizem   Chronic pain syndrome -Continue MS Contin, dilaudid. Reviewed PDMP     DVT prophylaxis:  enoxaparin (LOVENOX) injection 40 mg Start: 02/27/20 1000  Code Status: Full code Family Communication: No family at bedside Disposition Plan:   Status is: Inpatient  Remains inpatient appropriate because:Inpatient level of care appropriate due to severity of illness   Dispo: The patient is from: Home              Anticipated d/c is to: Home              Anticipated d/c date is: 2 days              Patient currently is not medically stable to d/c.  Planning for BKA 9/1    Consultants:   General surgery  Vascular surgery   Antimicrobials:  Anti-infectives (From admission, onward)   Start     Dose/Rate Route Frequency Ordered Stop   02/26/20 2220  cefTRIAXone (ROCEPHIN) 2 g in sodium chloride 0.9 % 100 mL IVPB       "And" Linked Group Details   2 g 200 mL/hr over 30 Minutes Intravenous Every 24 hours 02/26/20 2220     02/26/20 2220  metroNIDAZOLE (FLAGYL) IVPB 500 mg       "And" Linked Group Details   500 mg 100 mL/hr over 60 Minutes Intravenous Every 8 hours 02/26/20 2220     02/26/20 1715  piperacillin-tazobactam (ZOSYN) IVPB 3.375 g        3.375 g 100 mL/hr over 30 Minutes Intravenous  Once 02/26/20 1701 02/26/20 1809       Objective: Vitals:   02/28/20 0558 02/28/20 1515 02/28/20 2109 02/29/20  0450  BP: 130/70 122/62 137/69 130/70  Pulse: 100 97 (!) 102 100  Resp: 18 17 18 18   Temp: 98.9 F (37.2 C) 98.8 F (37.1 C) 99 F (37.2 C) 98.9 F (37.2 C)  TempSrc: Oral Oral Oral Oral  SpO2: 99% 98% 100% 100%  Weight:      Height:        Intake/Output Summary (Last 24 hours) at 02/29/2020 1305 Last data filed at 02/28/2020 2109 Gross per 24 hour  Intake --  Output 400 ml  Net -400 ml   Filed Weights   02/26/20 1645  Weight: 59.4 kg    Examination: General exam: Appears calm and  comfortable  Respiratory system: Clear to auscultation. Respiratory effort normal. Cardiovascular system: S1 & S2 heard, RRR. No pedal edema. Gastrointestinal system: Abdomen is nondistended, soft and nontender. Normal bowel sounds heard. Central nervous system: Alert and oriented. Non focal exam. Speech clear  Extremities: Symmetric  Psychiatry: Judgement and insight appear stable. Mood & affect appropriate.   Data Reviewed: I have personally reviewed following labs and imaging studies  CBC: Recent Labs  Lab 02/26/20 1725 02/29/20 0148  WBC 16.0* 4.3  NEUTROABS 14.4*  --   HGB 11.4* 7.8*  HCT 32.8* 23.5*  MCV 88.9 90.7  PLT 222 878   Basic Metabolic Panel: Recent Labs  Lab 02/26/20 1725 02/26/20 2322 02/27/20 0548 02/28/20 0107 02/29/20 0148  NA 128* 135 133* 132* 136  K 3.6 3.4* 3.9 3.6 3.9  CL 91* 101 101 100 105  CO2 21* 24 21* 22 21*  GLUCOSE 367* 162* 252* 321* 224*  BUN 13 10 9  <5* <5*  CREATININE 0.83 0.72 0.62 0.68 0.88  CALCIUM 8.9 8.2* 8.0* 7.7* 8.2*   GFR: Estimated Creatinine Clearance: 54.1 mL/min (by C-G formula based on SCr of 0.88 mg/dL). Liver Function Tests: Recent Labs  Lab 02/26/20 1725  AST 14*  ALT 11  ALKPHOS 61  BILITOT 1.1  PROT 8.3*  ALBUMIN 3.9   No results for input(s): LIPASE, AMYLASE in the last 168 hours. No results for input(s): AMMONIA in the last 168 hours. Coagulation Profile: No results for input(s): INR, PROTIME in the last 168 hours. Cardiac Enzymes: No results for input(s): CKTOTAL, CKMB, CKMBINDEX, TROPONINI in the last 168 hours. BNP (last 3 results) No results for input(s): PROBNP in the last 8760 hours. HbA1C: Recent Labs    02/26/20 2322 02/27/20 0548  HGBA1C 6.7* 6.7*   CBG: Recent Labs  Lab 02/28/20 1214 02/28/20 1740 02/28/20 2116 02/29/20 0732 02/29/20 1137  GLUCAP 224* 292* 260* 196* 224*   Lipid Profile: No results for input(s): CHOL, HDL, LDLCALC, TRIG, CHOLHDL, LDLDIRECT in the last 72  hours. Thyroid Function Tests: No results for input(s): TSH, T4TOTAL, FREET4, T3FREE, THYROIDAB in the last 72 hours. Anemia Panel: No results for input(s): VITAMINB12, FOLATE, FERRITIN, TIBC, IRON, RETICCTPCT in the last 72 hours. Sepsis Labs: Recent Labs  Lab 02/26/20 1725  LATICACIDVEN 3.9*    Recent Results (from the past 240 hour(s))  Wound or Superficial Culture     Status: None (Preliminary result)   Collection Time: 02/26/20  5:01 PM   Specimen: Ulcer; Wound  Result Value Ref Range Status   Specimen Description   Final    ULCER Performed at [facility], [address], [place], [place] [address]    Special Requests   Final    NONE Performed at [facility], [address], [place], [place] [address]    Gram  Stain   Final    FEW WBC PRESENT, PREDOMINANTLY PMN FEW GRAM NEGATIVE RODS RARE GRAM POSITIVE COCCI IN CLUSTERS    Culture   Final    RARE GRAM NEGATIVE RODS CULTURE REINCUBATED FOR BETTER GROWTH Performed at [facility], [place] [address] [place], [place] [address]    Report Status PENDING  Incomplete  SARS Coronavirus 2 by RT PCR (hospital order, performed in [facility] hospital lab) Nasopharyngeal Nasopharyngeal Swab     Status: None   Collection Time: 02/26/20  6:22 PM   Specimen: Nasopharyngeal Swab  Result Value Ref Range Status   SARS Coronavirus 2 NEGATIVE NEGATIVE Final    Comment: (NOTE) SARS-CoV-2 target nucleic acids are NOT DETECTED.  The SARS-CoV-2 RNA is generally detectable in upper and lower respiratory specimens during the acute phase of infection. The lowest concentration of SARS-CoV-2 viral copies this assay can detect is 250 copies / mL. A negative result does not preclude SARS-CoV-2 infection and should not be used as the sole basis for treatment or other patient management decisions.  A negative result may occur with improper specimen collection / handling, submission of specimen other than nasopharyngeal swab, presence of  viral mutation(s) within the areas targeted by this assay, and inadequate number of viral copies (<250 copies / mL). A negative result must be combined with clinical observations, patient history, and epidemiological information.  Fact Sheet for Patients:   StrictlyIdeas.no  Fact Sheet for Healthcare Providers: BankingDealers.co.za  This test is not yet approved or  cleared by the [place] FDA and has been authorized for detection and/or diagnosis of SARS-CoV-2 by FDA under an Emergency Use Authorization (EUA).  This EUA will remain in effect (meaning this test can be used) for the duration of the COVID-19 declaration under Section 564(b)(1) of the Act, 21 U.S.C. section 360bbb-3(b)(1), unless the authorization is terminated or revoked sooner.  Performed at [facility], [address], [place], [place] [address]   Blood Cultures x 2 sites     Status: None (Preliminary result)   Collection Time: 02/26/20 11:22 PM   Specimen: BLOOD  Result Value Ref Range Status   Specimen Description BLOOD LEFT ANTECUBITAL  Final   Special Requests   Final    BOTTLES DRAWN AEROBIC AND ANAEROBIC Blood Culture adequate volume   Culture   Final    NO GROWTH 3 DAYS Performed at [facility], [address] [place], [place] [address]    Report Status PENDING  Incomplete  Blood Cultures x 2 sites     Status: None (Preliminary result)   Collection Time: 02/26/20 11:33 PM   Specimen: BLOOD LEFT HAND  Result Value Ref Range Status   Specimen Description BLOOD LEFT HAND  Final   Special Requests AEROBIC BOTTLE ONLY Blood Culture adequate volume  Final   Culture   Final    NO GROWTH 3 DAYS Performed at [facility], [address] [place], [place] [address]    Report Status PENDING  Incomplete      Radiology Studies: MR FOOT LEFT W WO CONTRAST  Result Date: 02/27/2020 CLINICAL DATA:  Foot ulcer, question of osteomyelitis EXAM: MRI OF THE LEFT  FOREFOOT WITHOUT AND WITH CONTRAST TECHNIQUE: Multiplanar, multisequence MR imaging of the left foot was performed both before and after administration of intravenous contrast. CONTRAST:  51mL GADAVIST GADOBUTROL 1 MMOL/ML IV SOLN COMPARISON:  None. FINDINGS: Bones/Joint/Cartilage There is mildly increased T2 hyperintense signal with enhancement and T1 hypointensity with cortical irregularity  seen at the plantar base of the fifth metatarsal. There is also a probable thin incomplete nondisplaced fracture seen at the fifth metatarsal base with overlying periosteal reaction and mildly increased STIR signal. There is mild joint space loss seen at the tarsal metatarsal joints with subchondral cystic changes. There is scattered T2 hyperintense signal seen throughout the calcaneus and anterior talus without T1 hypointense no large joint effusions are noted. Ligaments The Lisfranc ligaments are intact. Muscles and Tendons Diffusely increased STIR signal with fatty infiltration is noted throughout the muscles of the forefoot, likely from chronic denervation atrophy. The flexor and extensor tendons appear to be intact. The visualized portion of the plantar fascia is intact. Soft tissues There is a focal area of ulceration seen along the lateral aspect of the midfoot measuring approximately 1 cm in transverse dimension with a peripherally enhancing sinus tract extending to the base of the fifth metatarsal where there is a peripherally enhancing loculated fluid collection which measures approximately 2.3 x 1.3 cm in dimension surrounding the base of the fifth metatarsal. IMPRESSION: Area of ulceration overlying the lateral plantar base of the fifth metatarsal with a enhancing sinus tract and loculated fluid collection, likely consistent with abscess measuring 2.3 x 1.3 cm. Findings suggestive of acute osteomyelitis involving the plantar base of the fifth metatarsal. Probable incomplete nondisplaced stress fracture seen at the  base of the fifth metatarsal with overlying periosteal reaction and mild reactive marrow. Electronically Signed   By: Prudencio Pair M.D.   On: 02/27/2020 23:59      Scheduled Meds: . aspirin EC  81 mg Oral Daily  . carbamazepine  100 mg Oral BID  . diltiazem  120 mg Oral Daily  . DULoxetine  30 mg Oral QHS  . DULoxetine  60 mg Oral QHS  . enoxaparin (LOVENOX) injection  40 mg Subcutaneous Q24H  . HYDROmorphone  16 mg Oral BID  . HYDROmorphone  24 mg Oral Daily  . insulin aspart  0-15 Units Subcutaneous TID WC  . insulin aspart  0-5 Units Subcutaneous QHS  . insulin glargine  12 Units Subcutaneous QHS  . levothyroxine  25 mcg Oral Q0600  . metoprolol tartrate  50 mg Oral BID  . morphine  30 mg Oral QHS  . multivitamin with minerals  1 tablet Oral Daily  . nutrition supplement (JUVEN)  1 packet Oral BID BM  . Ensure Max Protein  11 oz Oral QHS  . traZODone  150 mg Oral QHS   Continuous Infusions: . cefTRIAXone (ROCEPHIN)  IV 2 g (02/28/20 2152)   And  . metronidazole 500 mg (02/29/20 0826)     LOS: 3 days      Time spent: 25 minutes   Dessa Phi, DO [facility] 02/29/2020, 1:05 PM   Available via Epic secure chat 7am-7pm After these hours, please refer to coverage provider listed on [email or web address]

## 2020-02-29 NOTE — Consult Note (Signed)
[facility] Nurse Consult Note: Patient in [place] Reason for Consult: "Osteomyelitis to left foot-Pt to be d/c'd today possibly-amputation in 2-3 weeks." This patients wound is being managed by Servando Snare Vascular Surgeon. Direct all orders and discharge needs to [employer]. WOC has not seen and will not follow.   Wound, Ostomy, Continence Wound Treatment Associate [place] Tamala Julian, MSN, RN, [place], [place], [place] Office [phone]

## 2020-03-01 ENCOUNTER — Encounter (HOSPITAL_COMMUNITY): Payer: Self-pay | Admitting: Family Medicine

## 2020-03-01 ENCOUNTER — Encounter (HOSPITAL_COMMUNITY)
Admission: EM | Disposition: A | Discharge status: Skilled Nursing Facility | Payer: 59 | Source: Home / Self Care | Attending: Internal Medicine

## 2020-03-01 ENCOUNTER — Inpatient Hospital Stay (HOSPITAL_COMMUNITY): Payer: 59 | Admitting: Anesthesiology

## 2020-03-01 DIAGNOSIS — Z89512 Acquired absence of left leg below knee: Secondary | ICD-10-CM

## 2020-03-01 DIAGNOSIS — G894 Chronic pain syndrome: Secondary | ICD-10-CM

## 2020-03-01 DIAGNOSIS — E1142 Type 2 diabetes mellitus with diabetic polyneuropathy: Secondary | ICD-10-CM

## 2020-03-01 DIAGNOSIS — Z794 Long term (current) use of insulin: Secondary | ICD-10-CM

## 2020-03-01 HISTORY — PX: AMPUTATION: SHX166

## 2020-03-01 LAB — POCT I-STAT, CHEM 8
BUN: <3 mg/dL — ABNORMAL LOW (ref 8–23)
Calcium, Ion: 1.18 mmol/L (ref 1.15–1.40)
Chloride: 101 mmol/L (ref 98–111)
Creatinine, Ser: 0.5 mg/dL (ref 0.44–1.00)
Glucose, Bld: 176 mg/dL — ABNORMAL HIGH (ref 70–99)
HCT: 24 % — ABNORMAL LOW (ref 36.0–46.0)
Hemoglobin: 8.2 g/dL — ABNORMAL LOW (ref 12.0–15.0)
Potassium: 3.6 mmol/L (ref 3.5–5.1)
Sodium: 138 mmol/L (ref 135–145)
TCO2: 24 mmol/L (ref 22–32)

## 2020-03-01 LAB — CBC
HCT: 24.3 % — ABNORMAL LOW (ref 36.0–46.0)
HCT: 22.4 % — ABNORMAL LOW (ref 36.0–46.0)
Hemoglobin: 8.1 g/dL — ABNORMAL LOW (ref 12.0–15.0)
Hemoglobin: 7.4 g/dL — ABNORMAL LOW (ref 12.0–15.0)
MCH: 29.8 pg (ref 26.0–34.0)
MCH: 29.7 pg (ref 26.0–34.0)
MCHC: 33.3 g/dL (ref 30.0–36.0)
MCHC: 33 g/dL (ref 30.0–36.0)
MCV: 89.3 fL (ref 80.0–100.0)
MCV: 90 fL (ref 80.0–100.0)
Platelets: 180 10*3/uL (ref 150–400)
Platelets: 172 10*3/uL (ref 150–400)
RBC: 2.72 MIL/uL — ABNORMAL LOW (ref 3.87–5.11)
RBC: 2.49 MIL/uL — ABNORMAL LOW (ref 3.87–5.11)
RDW: 13.8 % (ref 11.5–15.5)
RDW: 14 % (ref 11.5–15.5)
WBC: 5.3 10*3/uL (ref 4.0–10.5)
WBC: 4.8 10*3/uL (ref 4.0–10.5)
nRBC: 0.6 % — ABNORMAL HIGH (ref 0.0–0.2)
nRBC: 0 % (ref 0.0–0.2)

## 2020-03-01 LAB — BASIC METABOLIC PANEL
Anion gap: 11 (ref 5–15)
BUN: <5 mg/dL — ABNORMAL LOW (ref 8–23)
CO2: 22 mmol/L (ref 22–32)
Calcium: 8.6 mg/dL — ABNORMAL LOW (ref 8.9–10.3)
Chloride: 103 mmol/L (ref 98–111)
Creatinine, Ser: 0.71 mg/dL (ref 0.44–1.00)
GFR calc Af Amer: >60 mL/min (ref >60)
GFR calc non Af Amer: >60 mL/min (ref >60)
Glucose, Bld: 162 mg/dL — ABNORMAL HIGH (ref 70–99)
Potassium: 3.6 mmol/L (ref 3.5–5.1)
Sodium: 136 mmol/L (ref 135–145)

## 2020-03-01 LAB — GLUCOSE, CAPILLARY
Glucose-Capillary: 159 mg/dL — ABNORMAL HIGH (ref 70–99)
Glucose-Capillary: 183 mg/dL — ABNORMAL HIGH (ref 70–99)
Glucose-Capillary: 165 mg/dL — ABNORMAL HIGH (ref 70–99)
Glucose-Capillary: 168 mg/dL — ABNORMAL HIGH (ref 70–99)

## 2020-03-01 LAB — CREATININE, SERUM
Creatinine, Ser: 0.69 mg/dL (ref 0.44–1.00)
GFR calc Af Amer: >60 mL/min (ref >60)
GFR calc non Af Amer: >60 mL/min (ref >60)

## 2020-03-01 LAB — SURGICAL PCR SCREEN
MRSA, PCR: NEGATIVE
Staphylococcus aureus: NEGATIVE

## 2020-03-01 LAB — PREPARE RBC (CROSSMATCH)

## 2020-03-01 SURGERY — AMPUTATION BELOW KNEE
Anesthesia: General | Site: Knee | Laterality: Left

## 2020-03-01 MED ORDER — SENNOSIDES-DOCUSATE SODIUM 8.6-50 MG PO TABS: 1.0000 | ORAL_TABLET | Freq: Every evening | ORAL | Status: DC | PRN

## 2020-03-01 MED ORDER — ONDANSETRON HCL 4 MG/2ML IJ SOLN: 4.0000 mg | Freq: Four times a day (QID) | INTRAMUSCULAR | Status: DC | PRN

## 2020-03-01 MED ORDER — OXYCODONE-ACETAMINOPHEN 5-325 MG PO TABS
1.0000 | ORAL_TABLET | ORAL | Status: DC | PRN
  Administered 2020-03-01 – 2020-03-13 (×29): 2 via ORAL
  Filled 2020-03-01 (×8): qty 2
  Filled 2020-03-01: qty 1
  Filled 2020-03-01 (×4): qty 2
  Filled 2020-03-01: qty 1
  Filled 2020-03-01 (×18): qty 2

## 2020-03-01 MED ORDER — HYDROMORPHONE HCL 1 MG/ML IJ SOLN
INTRAMUSCULAR | Status: AC
  Administered 2020-03-01: 1 mg
  Filled 2020-03-01: qty 1

## 2020-03-01 MED ORDER — STERILE WATER FOR IRRIGATION IR SOLN
Status: DC | PRN
  Administered 2020-03-01: 1000 mL

## 2020-03-01 MED ORDER — GUAIFENESIN-DM 100-10 MG/5ML PO SYRP: 15.0000 mL | ORAL_SOLUTION | ORAL | Status: DC | PRN

## 2020-03-01 MED ORDER — FENTANYL CITRATE (PF) 100 MCG/2ML IJ SOLN
INTRAMUSCULAR | Status: AC
  Filled 2020-03-01: qty 2

## 2020-03-01 MED ORDER — LIDOCAINE 2% (20 MG/ML) 5 ML SYRINGE
INTRAMUSCULAR | Status: DC | PRN
  Administered 2020-03-01: 60 mg via INTRAVENOUS

## 2020-03-01 MED ORDER — FENTANYL CITRATE (PF) 250 MCG/5ML IJ SOLN
INTRAMUSCULAR | Status: AC
Start: 2020-03-01 — End: ?
  Filled 2020-03-01: qty 5

## 2020-03-01 MED ORDER — FENTANYL CITRATE (PF) 100 MCG/2ML IJ SOLN
25.0000 ug | INTRAMUSCULAR | Status: DC | PRN
  Administered 2020-03-01 (×3): 50 ug via INTRAVENOUS

## 2020-03-01 MED ORDER — PANTOPRAZOLE SODIUM 40 MG PO TBEC
40.0000 mg | DELAYED_RELEASE_TABLET | Freq: Every day | ORAL | Status: DC
  Administered 2020-03-01 – 2020-03-13 (×13): 40 mg via ORAL
  Filled 2020-03-01 (×15): qty 1

## 2020-03-01 MED ORDER — BISACODYL 5 MG PO TBEC
5.0000 mg | DELAYED_RELEASE_TABLET | Freq: Every day | ORAL | Status: DC | PRN
  Administered 2020-03-03: 5 mg via ORAL
  Filled 2020-03-01: qty 1

## 2020-03-01 MED ORDER — HYDROMORPHONE HCL 1 MG/ML IJ SOLN
INTRAMUSCULAR | Status: AC
  Filled 2020-03-01: qty 1

## 2020-03-01 MED ORDER — HYDRALAZINE HCL 20 MG/ML IJ SOLN: 5.0000 mg | INTRAMUSCULAR | Status: DC | PRN

## 2020-03-01 MED ORDER — ALBUMIN HUMAN 5 % IV SOLN: INTRAVENOUS | Status: DC | PRN

## 2020-03-01 MED ORDER — ACETAMINOPHEN 325 MG PO TABS
325.0000 mg | ORAL_TABLET | ORAL | Status: DC | PRN
  Administered 2020-03-01: 650 mg via ORAL
  Filled 2020-03-01: qty 2

## 2020-03-01 MED ORDER — FENTANYL CITRATE (PF) 250 MCG/5ML IJ SOLN
INTRAMUSCULAR | Status: DC | PRN
Start: 2020-03-01 — End: 2020-03-01
  Administered 2020-03-01: 25 ug via INTRAVENOUS
  Administered 2020-03-01: 75 ug via INTRAVENOUS
  Administered 2020-03-01: 50 ug via INTRAVENOUS
  Administered 2020-03-01: 25 ug via INTRAVENOUS
  Administered 2020-03-01: 100 ug via INTRAVENOUS
  Administered 2020-03-01: 50 ug via INTRAVENOUS
  Administered 2020-03-01: 25 ug via INTRAVENOUS
  Administered 2020-03-01: 50 ug via INTRAVENOUS

## 2020-03-01 MED ORDER — LACTATED RINGERS IV SOLN: INTRAVENOUS | Status: DC | PRN

## 2020-03-01 MED ORDER — LABETALOL HCL 5 MG/ML IV SOLN: 10.0000 mg | INTRAVENOUS | Status: DC | PRN

## 2020-03-01 MED ORDER — HYDROMORPHONE HCL 1 MG/ML IJ SOLN
0.2500 mg | INTRAMUSCULAR | Status: DC | PRN
  Administered 2020-03-01 (×4): 0.5 mg via INTRAVENOUS

## 2020-03-01 MED ORDER — DOCUSATE SODIUM 100 MG PO CAPS
100.0000 mg | ORAL_CAPSULE | Freq: Every day | ORAL | Status: DC
  Administered 2020-03-02 – 2020-03-13 (×10): 100 mg via ORAL
  Filled 2020-03-01 (×11): qty 1

## 2020-03-01 MED ORDER — POTASSIUM CHLORIDE CRYS ER 20 MEQ PO TBCR
20.0000 meq | EXTENDED_RELEASE_TABLET | Freq: Every day | ORAL | Status: AC | PRN
  Administered 2020-03-03: 40 meq via ORAL
  Filled 2020-03-01: qty 2

## 2020-03-01 MED ORDER — ALUM & MAG HYDROXIDE-SIMETH 200-200-20 MG/5ML PO SUSP: 15.0000 mL | ORAL | Status: DC | PRN

## 2020-03-01 MED ORDER — ENOXAPARIN SODIUM 40 MG/0.4ML ~~LOC~~ SOLN
40.0000 mg | SUBCUTANEOUS | Status: DC
  Administered 2020-03-02 – 2020-03-13 (×12): 40 mg via SUBCUTANEOUS
  Filled 2020-03-01 (×12): qty 0.4

## 2020-03-01 MED ORDER — MAGNESIUM SULFATE 2 GM/50ML IV SOLN
2.0000 g | Freq: Every day | INTRAVENOUS | Status: DC | PRN
  Filled 2020-03-01: qty 50

## 2020-03-01 MED ORDER — PROPOFOL 10 MG/ML IV BOLUS
INTRAVENOUS | Status: AC
  Filled 2020-03-01: qty 20

## 2020-03-01 MED ORDER — PHENOL 1.4 % MT LIQD: 1.0000 | OROMUCOSAL | Status: DC | PRN

## 2020-03-01 MED ORDER — LORAZEPAM 2 MG/ML IJ SOLN
0.5000 mg | Freq: Once | INTRAMUSCULAR | Status: AC
  Administered 2020-03-01: 0.5 mg via INTRAVENOUS
  Filled 2020-03-01: qty 1

## 2020-03-01 MED ORDER — PREGABALIN 75 MG PO CAPS
75.0000 mg | ORAL_CAPSULE | Freq: Every day | ORAL | Status: DC
  Administered 2020-03-01 – 2020-03-13 (×13): 75 mg via ORAL
  Filled 2020-03-01 (×13): qty 1

## 2020-03-01 MED ORDER — ACETAMINOPHEN 650 MG RE SUPP: 325.0000 mg | RECTAL | Status: DC | PRN

## 2020-03-01 MED ORDER — PROPOFOL 10 MG/ML IV BOLUS
INTRAVENOUS | Status: DC | PRN
  Administered 2020-03-01: 120 mg via INTRAVENOUS

## 2020-03-01 MED ORDER — MIDAZOLAM HCL 2 MG/2ML IJ SOLN
INTRAMUSCULAR | Status: AC
  Filled 2020-03-01: qty 2

## 2020-03-01 MED ORDER — HYDROMORPHONE HCL 1 MG/ML IJ SOLN
0.5000 mg | INTRAMUSCULAR | Status: DC | PRN
  Administered 2020-03-01 – 2020-03-12 (×15): 1 mg via INTRAVENOUS
  Filled 2020-03-01 (×17): qty 1

## 2020-03-01 MED ORDER — CEFAZOLIN SODIUM-DEXTROSE 2-4 GM/100ML-% IV SOLN
2.0000 g | Freq: Three times a day (TID) | INTRAVENOUS | Status: AC
  Administered 2020-03-01 – 2020-03-02 (×2): 2 g via INTRAVENOUS
  Filled 2020-03-01 (×2): qty 100

## 2020-03-01 MED ORDER — MIDAZOLAM HCL 2 MG/2ML IJ SOLN
INTRAMUSCULAR | Status: DC | PRN
  Administered 2020-03-01: 2 mg via INTRAVENOUS

## 2020-03-01 MED ORDER — FLEET ENEMA 7-19 GM/118ML RE ENEM: 1.0000 | ENEMA | Freq: Once | RECTAL | Status: DC | PRN

## 2020-03-01 MED ORDER — 0.9 % SODIUM CHLORIDE (POUR BTL) OPTIME
TOPICAL | Status: DC | PRN
  Administered 2020-03-01: 1000 mL

## 2020-03-01 MED ORDER — METOPROLOL TARTRATE 5 MG/5ML IV SOLN: 2.0000 mg | INTRAVENOUS | Status: DC | PRN

## 2020-03-01 MED ORDER — FENTANYL CITRATE (PF) 250 MCG/5ML IJ SOLN
INTRAMUSCULAR | Status: AC
  Filled 2020-03-01: qty 5

## 2020-03-01 MED ORDER — ONDANSETRON HCL 4 MG/2ML IJ SOLN
INTRAMUSCULAR | Status: DC | PRN
  Administered 2020-03-01: 4 mg via INTRAVENOUS

## 2020-03-01 SURGICAL SUPPLY — 50 items
BANDAGE ESMARK 6X9 LF (GAUZE/BANDAGES/DRESSINGS) IMPLANT
BLADE SAW GIGLI 510 (BLADE) ×2 IMPLANT
BLADE SAW GIGLI 510MM (BLADE) ×1
BNDG ELASTIC 4X5.8 VLCR STR LF (GAUZE/BANDAGES/DRESSINGS) ×3 IMPLANT
BNDG ELASTIC 6X5.8 VLCR STR LF (GAUZE/BANDAGES/DRESSINGS) IMPLANT
BNDG ESMARK 6X9 LF (GAUZE/BANDAGES/DRESSINGS)
BNDG GAUZE ELAST 4 BULKY (GAUZE/BANDAGES/DRESSINGS) ×2 IMPLANT
CANISTER SUCT 3000ML PPV (MISCELLANEOUS) ×3 IMPLANT
CLIP LIGATING EXTRA MED SLVR (CLIP) ×3 IMPLANT
CLIP LIGATING EXTRA SM BLUE (MISCELLANEOUS) ×3 IMPLANT
COVER SURGICAL LIGHT HANDLE (MISCELLANEOUS) ×6 IMPLANT
COVER WAND RF STERILE (DRAPES) ×1 IMPLANT
CUFF TOURN SGL QUICK 34 (TOURNIQUET CUFF)
CUFF TOURN SGL QUICK 42 (TOURNIQUET CUFF) IMPLANT
CUFF TRNQT CYL 34X4.125X (TOURNIQUET CUFF) IMPLANT
DRAIN SNY 10X20 3/4 PERF (WOUND CARE) IMPLANT
DRAPE HALF SHEET 40X57 (DRAPES) ×3 IMPLANT
DRAPE ORTHO SPLIT 77X108 STRL (DRAPES) ×6
DRAPE SURG ORHT 6 SPLT 77X108 (DRAPES) ×2 IMPLANT
ELECT REM PT RETURN 9FT ADLT (ELECTROSURGICAL) ×3
ELECTRODE REM PT RTRN 9FT ADLT (ELECTROSURGICAL) ×1 IMPLANT
EVACUATOR SILICONE 100CC (DRAIN) IMPLANT
GAUZE SPONGE 4X4 12PLY STRL (GAUZE/BANDAGES/DRESSINGS) ×3 IMPLANT
GAUZE SPONGE 4X4 12PLY STRL LF (GAUZE/BANDAGES/DRESSINGS) ×2 IMPLANT
GAUZE XEROFORM 5X9 LF (GAUZE/BANDAGES/DRESSINGS) ×3 IMPLANT
GLOVE BIOGEL PI IND STRL 7.0 (GLOVE) IMPLANT
GLOVE BIOGEL PI INDICATOR 7.0 (GLOVE) ×2
GLOVE ECLIPSE 6.5 STRL STRAW (GLOVE) ×2 IMPLANT
GLOVE ECLIPSE 7.0 STRL STRAW (GLOVE) ×2 IMPLANT
GLOVE SS BIOGEL STRL SZ 7.5 (GLOVE) ×1 IMPLANT
GLOVE SUPERSENSE BIOGEL SZ 7.5 (GLOVE) ×2
GLOVE SURG SS PI 7.5 STRL IVOR (GLOVE) ×2 IMPLANT
GOWN STRL REUS W/ TWL LRG LVL3 (GOWN DISPOSABLE) ×3 IMPLANT
GOWN STRL REUS W/TWL LRG LVL3 (GOWN DISPOSABLE) ×9
KIT BASIN OR (CUSTOM PROCEDURE TRAY) ×3 IMPLANT
KIT TURNOVER KIT B (KITS) ×3 IMPLANT
NS IRRIG 1000ML POUR BTL (IV SOLUTION) ×3 IMPLANT
PACK GENERAL/GYN (CUSTOM PROCEDURE TRAY) ×3 IMPLANT
PAD ARMBOARD 7.5X6 YLW CONV (MISCELLANEOUS) ×6 IMPLANT
PADDING CAST COTTON 6X4 STRL (CAST SUPPLIES) IMPLANT
SPONGE LAP 18X18 X RAY DECT (DISPOSABLE) ×2 IMPLANT
STAPLER VISISTAT 35W (STAPLE) ×3 IMPLANT
STOCKINETTE IMPERVIOUS LG (DRAPES) ×3 IMPLANT
SUT ETHILON 3 0 PS 1 (SUTURE) IMPLANT
SUT VIC AB 0 CT1 18XCR BRD 8 (SUTURE) ×2 IMPLANT
SUT VIC AB 0 CT1 8-18 (SUTURE) ×6
SUT VICRYL AB 2 0 TIES (SUTURE) ×3 IMPLANT
TOWEL GREEN STERILE (TOWEL DISPOSABLE) ×6 IMPLANT
UNDERPAD 30X36 HEAVY ABSORB (UNDERPADS AND DIAPERS) ×3 IMPLANT
WATER STERILE IRR 1000ML POUR (IV SOLUTION) ×3 IMPLANT

## 2020-03-01 NOTE — Transfer of Care (Signed)
Immediate Anesthesia Transfer of Care Note  Patient: Jessica Dunlap  Procedure(s) Performed: LEFT BELOW KNEE AMPUTATION (Left Knee)  Patient Location: PACU  Anesthesia Type:General  Level of Consciousness: drowsy, patient cooperative and responds to stimulation  Airway & Oxygen Therapy: Patient Spontanous Breathing  Post-op Assessment: Report given to RN and Post -op Vital signs reviewed and stable  Post vital signs: Reviewed and stable  Last Vitals:  Vitals Value Taken Time  BP 140/74 03/01/20 1004  Temp    Pulse 109 03/01/20 1007  Resp 16 03/01/20 1007  SpO2 96 % 03/01/20 1007  Vitals shown include unvalidated device data.  Last Pain:  Vitals:   03/01/20 0527  TempSrc: Oral  PainSc:          Complications: No complications documented.

## 2020-03-01 NOTE — Progress Notes (Signed)
Nutrition Follow-up  DOCUMENTATION CODES:   Not applicable  INTERVENTION:   -Once diet is advanced:  -Continue MVI with minerals daily -Continue 1 packet Juven BID, each packet provides 95 calories, 2.5 grams of protein (collagen), and 9.8 grams of carbohydrate (3 grams sugar); also contains 7 grams of L-arginine and L-glutamine, 300 mg vitamin C, 15 mg vitamin E, 1.2 mcg vitamin B-12, 9.5 mg zinc, 200 mg calcium, and 1.5 g  Calcium Beta-hydroxy-Beta-methylbutyrate to support wound healing -Continue Ensure Max po daily, each supplement provides 150 kcal and 30 grams of protein.   NUTRITION DIAGNOSIS:   Increased nutrient needs related to wound healing as evidenced by estimated needs.  Ongoing  GOAL:   Patient will meet greater than or equal to 90% of their needs  Progressing   MONITOR:   PO intake, Supplement acceptance, Diet advancement, Labs, Weight trends, Skin, I & O's  REASON FOR ASSESSMENT:   Consult Wound healing  ASSESSMENT:   Jessica Dunlap is a 64 y.o. female with a history of Diabetes type 2 with neuropathy, hypertension, hypothyroidism, depression, h/o cervical cancer. Has been followed for chronic foot ulceration by wound care.  9/1- s/p lt BKA  Reviewed I/O's: +1.2 L x 24 hours and +4 L since admission  Pt down in OR at time of attempted contact. Plan for lt BKA today.   Pt with good appetite pre-operatively. Noted meal completion 75-100%. Pt was accepting Juven and Ensure Max supplements per MAR.  Labs reviewed: CBGS: 159-183 (inpatient orders for glycemic control are 0-15 units insulin aspart TID with meals, 0-5 units insulin aspart daily at bedtime, 3 units insulin aspart TID with meals, and 12 units inuslin glargine daily at bedtime).   Diet Order:   Diet Order            Diet heart healthy/carb modified Room service appropriate? Yes; Fluid consistency: Thin  Diet effective now                 EDUCATION NEEDS:   Education needs have been  addressed  Skin:  Skin Assessment: Skin Integrity Issues: Skin Integrity Issues:: Incisions Diabetic Ulcer: - Incisions: s/p lt BKA  Last BM:  02/27/20  Height:   Ht Readings from Last 1 Encounters:  02/26/20 5\' 3"  (1.6 m)    Weight:   Wt Readings from Last 1 Encounters:  02/26/20 59.4 kg    Ideal Body Weight:  48.9 kg  BMI:  Adjusted BMI 24.8 (adjusted for lt BKA)    Estimated Nutritional Needs:   Kcal:  8315-1761  Protein:  90-105 grams  Fluid:  > 1.7 L    Loistine Chance, RD, LDN, [place] Registered Dietitian II Certified Diabetes Care and Education Specialist Please refer to AMION for RD and/or RD on-call/weekend/after hours pager

## 2020-03-01 NOTE — Anesthesia Procedure Notes (Signed)
Procedure Name: LMA Insertion Date/Time: 03/01/2020 8:35 AM Performed by: Janace Litten, CRNA Pre-anesthesia Checklist: Patient identified, Emergency Drugs available, Suction available and Patient being monitored Patient Re-evaluated:Patient Re-evaluated prior to induction Oxygen Delivery Method: Circle System Utilized Preoxygenation: Pre-oxygenation with 100% oxygen Induction Type: IV induction Ventilation: Mask ventilation without difficulty LMA: LMA inserted LMA Size: 3.0 Number of attempts: 1 Placement Confirmation: positive ETCO2 Tube secured with: Tape Dental Injury: Teeth and Oropharynx as per pre-operative assessment

## 2020-03-01 NOTE — Op Note (Signed)
    OPERATIVE REPORT  DATE OF SURGERY: 03/01/2020  PATIENT: Jessica Dunlap, 64 y.o. female MRN: [id number]  DOB: 1955/09/11  PRE-OPERATIVE DIAGNOSIS: Gangrene left foot  POST-OPERATIVE DIAGNOSIS:  Same  PROCEDURE: Left below-knee amputation  SURGEON:  Curt Jews, M.D.  PHYSICIAN ASSISTANT: Theda Sers PA-C  The assistant was needed for exposure and to expedite the case  ANESTHESIA: General  EBL: per anesthesia record  Total I/O In: 1200 [I.V.:600; IV Piggyback:600] Out: 150 [Blood:150]  BLOOD ADMINISTERED: none  DRAINS: none  SPECIMEN: none  COUNTS CORRECT:  YES  PATIENT DISPOSITION:  PACU - hemodynamically stable  PROCEDURE DETAILS: The patient was taken up and placed supine position where the left leg was prepped draped you sterile fashion.  Incision is made several fingerbreadths below the tibial prominence and carried down through the anterior tibial muscle bodies.  The patient had no evidence of infection at this level and the muscle was well vascularized.  The anterior tibial neurovascular bundle was ligated with 2-0 Vicryl ties and divided.  A posterior gastroc muscle was left intact with the posterior muscle skin flap.  The soleus muscle was divided in line with the anterior incision.  The peroneal neurovascular bundle was ligated and divided on medial to the fibula.  Popliteal artery and vein were ligated and divided in line with the anterior incision.  Periosteum was elevated off the fibula and tibia.  The fibula was divided with a bone shears and the tibia was divided with a Gigli saw.  The bone edges were smoothed with a bone rasp.  The wounds irrigated with saline.  Hemostasis electrocautery.  The wounds were closed by reapproximating the posterior sheath to the anterior sheath with interrupted 0 Vicryl figure-of-eight sutures.  The skin was closed with skin staples.  Sterile dressing was applied and the patient was transferred to the recovery room in stable  condition   [name], M.D., [facility] 03/01/2020 10:34 AM

## 2020-03-01 NOTE — Progress Notes (Signed)
Patient ID: Jessica Dunlap, female   DOB: 1955-07-16, 64 y.o.   MRN: [id number]  PROGRESS NOTE    Jessica Dunlap  AJO:[id number] DOB: 04-Oct-1955 DOA: 02/26/2020 PCP: Redmond School, MD    Brief Narrative:  Jessica Dunlap is a 64 year old female with past medical history significant for type 2 diabetes, uncontrolled with hyperglycemia and neuropathy, hypertension, hypothyroidism, depression, CAD, nonalcoholic cirrhosis, who was admitted on 02/26/2019 due to left foot diabetic ulcer as well as DKA.  Patient was initially started on insulin drip which was transitioned further to subcutaneous Lantus and sliding scale insulin.  Vascular surgery as well as general surgery was consulted due to her left diabetic foot ulcer.  Patient was transferred to [facility] for further vascular surgery evaluation.   Assessment & Plan:   Principal Problem:   DKA, type 2 ([place]) Active Problems:   Uncontrolled type 2 diabetes with neuropathy ([place])   Hypothyroidism   Hypertension   Coronary artery disease   Cirrhosis, non-alcoholic (HCC)   Diabetic foot ulcer ([place])   DKA (diabetic ketoacidoses) ([place])   Diabetic ulcer of foot associated with diabetes mellitus due to underlying condition, limited to breakdown of skin ([place])  DKA -Hemoglobin A1c 6.7  -Transitioned off IV insulin -Lantus, NovoLog sliding scale. Dose increased today  -Appreciate diabetic coordinator  Left diabetic foot ulcer/osteomyelitis  -Failed outpatient oral antibiotic treatment, Augmentin -Wound culture from 02/18/2020 with group B strept agalactiae -Wound culture from 02/26/2020 pending -Continue Rocephin, Flagyl -MRI revealed lateral plantar base of the fifth metatarsal fluid collection likely abscess as well as acute osteomyelitis involving plantar base of fifth metatarsal -ABI normal bilaterally -Vascular surgery completed BKA 9/1  Hypothyroidism -Continue Synthroid  CAD -Continue aspirin, metoprolol  Essential  hypertension -Continue metoprolol, Cardizem   Chronic pain syndrome -Continue MS Contin, dilaudid. Reviewed PDMP  Worsening pain post surgery-->will add Lyrica   DVT prophylaxis: SCD/Compression stockings Code Status: Full code  Family Communication: daughter at bedside Disposition Plan: CIR, hopefully, family lives in [place] inpatient appropriate because:Inpatient level of care appropriate due to severity of illness   Consultants:   General Surgery  Vascular Surgery  Procedures:  Left BKA  Antimicrobials: Anti-infectives (From admission, onward)   Start     Dose/Rate Route Frequency Ordered Stop   03/01/20 2200  ceFAZolin (ANCEF) IVPB 2g/100 mL premix        2 g 200 mL/hr over 30 Minutes Intravenous Every 8 hours 03/01/20 1159 03/02/20 1359   02/26/20 2220  cefTRIAXone (ROCEPHIN) 2 g in sodium chloride 0.9 % 100 mL IVPB  Status:  Discontinued       "And" Linked Group Details   2 g 200 mL/hr over 30 Minutes Intravenous Every 24 hours 02/26/20 2220 03/01/20 1159   02/26/20 2220  metroNIDAZOLE (FLAGYL) IVPB 500 mg  Status:  Discontinued       "And" Linked Group Details   500 mg 100 mL/hr over 60 Minutes Intravenous Every 8 hours 02/26/20 2220 03/01/20 1159   02/26/20 1715  piperacillin-tazobactam (ZOSYN) IVPB 3.375 g        3.375 g 100 mL/hr over 30 Minutes Intravenous  Once 02/26/20 1701 02/26/20 1809       Subjective: S/p BKA this am. Having a lot of pain, on chronic opioids.   Objective: Vitals:   03/01/20 1105 03/01/20 1120 03/01/20 1135 03/01/20 1200  BP: (!) 151/72 (!) 151/76 (!) 152/78 (!) 158/73  Pulse: (!) 107 (!) 106 (!) 104 (!) 106  Resp:  13 15 12 16   Temp:  (!) 97.1 F (36.2 C) (!) 97.1 F (36.2 C) 98.5 F (36.9 C)  TempSrc:    Oral  SpO2: 98% 96% 99% 99%  Weight:      Height:        Intake/Output Summary (Last 24 hours) at 03/01/2020 1629 Last data filed at 03/01/2020 1005 Gross per 24 hour  Intake 1640 ml  Output 150 ml  Net 1490  ml   Filed Weights   02/26/20 1645  Weight: 59.4 kg    Examination:  General exam: Appears calm and comfortable  Respiratory system: Clear to auscultation. Respiratory effort normal. Cardiovascular system: S1 & S2 heard, RRR.  Gastrointestinal system: Abdomen is nondistended, soft and nontender.  Central nervous system: Alert and oriented. No focal neurological deficits. Extremities: Ace wrap in place on left BKA Skin: No rashes Psychiatry: Judgement and insight appear normal. Mood & affect appropriate.     Data Reviewed: I have personally reviewed following labs and imaging studies  CBC: Recent Labs  Lab 02/26/20 1725 02/29/20 0148 03/01/20 0352 03/01/20 0924 03/01/20 1222  WBC 16.0* 4.3 5.3  --  4.8  NEUTROABS 14.4*  --   --   --   --   HGB 11.4* 7.8* 8.1* 8.2* 7.4*  HCT 32.8* 23.5* 24.3* 24.0* 22.4*  MCV 88.9 90.7 89.3  --  90.0  PLT 222 163 180  --  213   Basic Metabolic Panel: Recent Labs  Lab 02/26/20 2322 02/26/20 2322 02/27/20 0548 02/27/20 0548 02/28/20 0107 02/29/20 0148 03/01/20 0352 03/01/20 0924 03/01/20 1222  NA 135   < > 133*  --  132* 136 136 138  --   K 3.4*   < > 3.9  --  3.6 3.9 3.6 3.6  --   CL 101   < > 101  --  100 105 103 101  --   CO2 24  --  21*  --  22 21* 22  --   --   GLUCOSE 162*   < > 252*  --  321* 224* 162* 176*  --   BUN 10   < > 9  --  <5* <5* <5* <3*  --   CREATININE 0.72   < > 0.62   < > 0.68 0.88 0.71 0.50 0.69  CALCIUM 8.2*  --  8.0*  --  7.7* 8.2* 8.6*  --   --    < > = values in this interval not displayed.   GFR: Estimated Creatinine Clearance: 59.5 mL/min (by C-G formula based on SCr of 0.69 mg/dL). Liver Function Tests: Recent Labs  Lab 02/26/20 1725  AST 14*  ALT 11  ALKPHOS 61  BILITOT 1.1  PROT 8.3*  ALBUMIN 3.9   CBG: Recent Labs  Lab 02/29/20 1137 02/29/20 1648 02/29/20 2032 03/01/20 1005 03/01/20 1200  GLUCAP 224* 146* 217* 159* 183*   Sepsis Labs: Recent Labs  Lab 02/26/20 1725   LATICACIDVEN 3.9*    Recent Results (from the past 240 hour(s))  Wound or Superficial Culture     Status: None   Collection Time: 02/26/20  5:01 PM   Specimen: Ulcer; Wound  Result Value Ref Range Status   Specimen Description   Final    ULCER Performed at [facility], [address], [place], [place] [address]    Special Requests   Final    NONE Performed at [facility], [address], [place], [place] [address]    Gram  Stain   Final    FEW WBC PRESENT, PREDOMINANTLY PMN FEW GRAM NEGATIVE RODS RARE GRAM POSITIVE COCCI IN CLUSTERS    Culture   Final    RARE STREPTOCOCCUS AGALACTIAE RARE ESCHERICHIA COLI TESTING AGAINST S. AGALACTIAE NOT ROUTINELY PERFORMED DUE TO PREDICTABILITY OF AMP/PEN/VAN SUSCEPTIBILITY. Performed at [facility], [place] [address] [place], [place] [address]    Report Status 02/29/2020 FINAL  Final   Organism ID, Bacteria ESCHERICHIA COLI  Final      Susceptibility   Escherichia coli - MIC*    AMPICILLIN 16 INTERMEDIATE Intermediate     CEFAZOLIN <=4 SENSITIVE Sensitive     CEFEPIME <=0.12 SENSITIVE Sensitive     CEFTAZIDIME <=1 SENSITIVE Sensitive     CEFTRIAXONE <=0.25 SENSITIVE Sensitive     CIPROFLOXACIN 0.5 SENSITIVE Sensitive     GENTAMICIN 4 SENSITIVE Sensitive     IMIPENEM 0.5 SENSITIVE Sensitive     TRIMETH/SULFA >=320 RESISTANT Resistant     AMPICILLIN/SULBACTAM <=2 SENSITIVE Sensitive     PIP/TAZO <=4 SENSITIVE Sensitive     * RARE ESCHERICHIA COLI  SARS Coronavirus 2 by RT PCR (hospital order, performed in [facility] hospital lab) Nasopharyngeal Nasopharyngeal Swab     Status: None   Collection Time: 02/26/20  6:22 PM   Specimen: Nasopharyngeal Swab  Result Value Ref Range Status   SARS Coronavirus 2 NEGATIVE NEGATIVE Final    Comment: (NOTE) SARS-CoV-2 target nucleic acids are NOT DETECTED.  The SARS-CoV-2 RNA is generally detectable in upper and lower respiratory specimens during the acute phase of infection. The  lowest concentration of SARS-CoV-2 viral copies this assay can detect is 250 copies / mL. A negative result does not preclude SARS-CoV-2 infection and should not be used as the sole basis for treatment or other patient management decisions.  A negative result may occur with improper specimen collection / handling, submission of specimen other than nasopharyngeal swab, presence of viral mutation(s) within the areas targeted by this assay, and inadequate number of viral copies (<250 copies / mL). A negative result must be combined with clinical observations, patient history, and epidemiological information.  Fact Sheet for Patients:   StrictlyIdeas.no  Fact Sheet for Healthcare Providers: BankingDealers.co.za  This test is not yet approved or  cleared by the [place] FDA and has been authorized for detection and/or diagnosis of SARS-CoV-2 by FDA under an Emergency Use Authorization (EUA).  This EUA will remain in effect (meaning this test can be used) for the duration of the COVID-19 declaration under Section 564(b)(1) of the Act, 21 U.S.C. section 360bbb-3(b)(1), unless the authorization is terminated or revoked sooner.  Performed at [facility], [address], [place], [place] [address]   Blood Cultures x 2 sites     Status: None (Preliminary result)   Collection Time: 02/26/20 11:22 PM   Specimen: BLOOD  Result Value Ref Range Status   Specimen Description BLOOD LEFT ANTECUBITAL  Final   Special Requests   Final    BOTTLES DRAWN AEROBIC AND ANAEROBIC Blood Culture adequate volume   Culture   Final    NO GROWTH 4 DAYS Performed at [facility], [address], [place], [place] [address]    Report Status PENDING  Incomplete  Blood Cultures x 2 sites     Status: None (Preliminary result)   Collection Time: 02/26/20 11:33 PM   Specimen: BLOOD LEFT HAND  Result Value Ref Range Status   Specimen Description BLOOD LEFT HAND   Final  Special Requests AEROBIC BOTTLE ONLY Blood Culture adequate volume  Final   Culture   Final    NO GROWTH 4 DAYS Performed at [facility], [address] [place], [place] [address]    Report Status PENDING  Incomplete  Surgical pcr screen     Status: None   Collection Time: 03/01/20  1:11 AM   Specimen: Nasal Mucosa; Nasal Swab  Result Value Ref Range Status   MRSA, PCR NEGATIVE NEGATIVE Final   Staphylococcus aureus NEGATIVE NEGATIVE Final    Comment: (NOTE) The Xpert SA Assay (FDA approved for NASAL specimens in patients 19 years of age and older), is one component of a comprehensive surveillance program. It is not intended to diagnose infection nor to guide or monitor treatment. Performed at [facility], [place] [address], [place], [place] [address]       Radiology Studies: No results found.   Scheduled Meds: . aspirin EC  81 mg Oral Daily  . carbamazepine  100 mg Oral BID  . diltiazem  120 mg Oral Daily  . [START ON 03/02/2020] docusate sodium  100 mg Oral Daily  . DULoxetine  30 mg Oral QHS  . DULoxetine  60 mg Oral QHS  . [START ON 03/02/2020] enoxaparin (LOVENOX) injection  40 mg Subcutaneous Q24H  . fentaNYL      . fentaNYL      . HYDROmorphone      . HYDROmorphone  16 mg Oral BID  . HYDROmorphone  24 mg Oral Daily  . insulin aspart  0-15 Units Subcutaneous TID WC  . insulin aspart  0-5 Units Subcutaneous QHS  . insulin aspart  3 Units Subcutaneous TID WC  . insulin glargine  12 Units Subcutaneous QHS  . levothyroxine  200 mcg Oral Q0600  . metoprolol tartrate  50 mg Oral BID  . morphine  30 mg Oral QHS  . multivitamin with minerals  1 tablet Oral Daily  . nutrition supplement (JUVEN)  1 packet Oral BID BM  . pantoprazole  40 mg Oral Daily  . Ensure Max Protein  11 oz Oral QHS  . traZODone  150 mg Oral QHS   Continuous Infusions: .  ceFAZolin (ANCEF) IV    . magnesium sulfate bolus IVPB       LOS: 4 days    Donnamae Jude,  MD 03/01/2020 4:29 PM [phone] [facility] If 7PM-7AM, please contact night-coverage 03/01/2020, 4:29 PM

## 2020-03-01 NOTE — Plan of Care (Signed)

## 2020-03-01 NOTE — Progress Notes (Signed)
Inpatient Rehabilitation Admissions Coordinator  Inpatient rehab consul received. I will follow up after therapy evaluations.  Danne Baxter, RN, MSN Rehab Admissions Coordinator [phone] 03/01/2020 1:25 PM

## 2020-03-01 NOTE — Addendum Note (Signed)
Addendum  created 03/01/20 1119 by Hessie Dibble T, CRNA   Child order released for a procedure order, Clinical Note Signed, Intraprocedure Blocks edited, LDA created via procedure documentation, LDA properties accepted

## 2020-03-01 NOTE — Anesthesia Postprocedure Evaluation (Signed)
Anesthesia Post Note  Patient: Jessica Dunlap  Procedure(s) Performed: LEFT BELOW KNEE AMPUTATION (Left Knee)     Patient location during evaluation: PACU Anesthesia Type: General Level of consciousness: awake Pain management: pain level controlled Vital Signs Assessment: post-procedure vital signs reviewed and stable Respiratory status: spontaneous breathing Cardiovascular status: stable Postop Assessment: no apparent nausea or vomiting Anesthetic complications: no   No complications documented.  Last Vitals:  Vitals:   03/01/20 1005 03/01/20 1020  BP: 140/74 (!) 145/71  Pulse: (!) 115 (!) 106  Resp: (!) 24 17  Temp: 37.2 C   SpO2: 98% 94%    Last Pain:  Vitals:   03/01/20 1005  TempSrc:   PainSc: Asleep                 Sheehan Stacey

## 2020-03-01 NOTE — Interval H&P Note (Signed)
History and Physical Interval Note:  03/01/2020 8:17 AM  Jessica Dunlap  has presented today for surgery, with the diagnosis of DIABETIC FOOT ULCER ASSOCIATED WITH DIABETES MELLITUS.  The various methods of treatment have been discussed with the patient and family. After consideration of risks, benefits and other options for treatment, the patient has consented to  Procedure(s): LEFT AMPUTATION BELOW KNEE (Left) as a surgical intervention.  The patient's history has been reviewed, patient examined, no change in status, stable for surgery.  I have reviewed the patient's chart and labs.  Questions were answered to the patient's satisfaction.     Curt Jews

## 2020-03-02 ENCOUNTER — Encounter (HOSPITAL_COMMUNITY): Payer: Self-pay | Admitting: Vascular Surgery

## 2020-03-02 DIAGNOSIS — E1110 Type 2 diabetes mellitus with ketoacidosis without coma: Secondary | ICD-10-CM

## 2020-03-02 DIAGNOSIS — L97408 Non-pressure chronic ulcer of unspecified heel and midfoot with other specified severity: Secondary | ICD-10-CM

## 2020-03-02 DIAGNOSIS — E11621 Type 2 diabetes mellitus with foot ulcer: Secondary | ICD-10-CM

## 2020-03-02 DIAGNOSIS — E039 Hypothyroidism, unspecified: Secondary | ICD-10-CM

## 2020-03-02 DIAGNOSIS — E1140 Type 2 diabetes mellitus with diabetic neuropathy, unspecified: Secondary | ICD-10-CM

## 2020-03-02 DIAGNOSIS — I2510 Atherosclerotic heart disease of native coronary artery without angina pectoris: Secondary | ICD-10-CM

## 2020-03-02 DIAGNOSIS — E08621 Diabetes mellitus due to underlying condition with foot ulcer: Secondary | ICD-10-CM

## 2020-03-02 DIAGNOSIS — K7460 Unspecified cirrhosis of liver: Secondary | ICD-10-CM

## 2020-03-02 DIAGNOSIS — I10 Essential (primary) hypertension: Secondary | ICD-10-CM

## 2020-03-02 LAB — BASIC METABOLIC PANEL
Anion gap: 11 (ref 5–15)
BUN: <5 mg/dL — ABNORMAL LOW (ref 8–23)
CO2: 23 mmol/L (ref 22–32)
Calcium: 8.5 mg/dL — ABNORMAL LOW (ref 8.9–10.3)
Chloride: 102 mmol/L (ref 98–111)
Creatinine, Ser: 0.76 mg/dL (ref 0.44–1.00)
GFR calc Af Amer: >60 mL/min (ref >60)
GFR calc non Af Amer: >60 mL/min (ref >60)
Glucose, Bld: 188 mg/dL — ABNORMAL HIGH (ref 70–99)
Potassium: 3.2 mmol/L — ABNORMAL LOW (ref 3.5–5.1)
Sodium: 136 mmol/L (ref 135–145)

## 2020-03-02 LAB — CULTURE, BLOOD (ROUTINE X 2)
Culture: NO GROWTH
Culture: NO GROWTH
Special Requests: ADEQUATE
Special Requests: ADEQUATE

## 2020-03-02 LAB — SURGICAL PATHOLOGY

## 2020-03-02 LAB — GLUCOSE, CAPILLARY
Glucose-Capillary: 214 mg/dL — ABNORMAL HIGH (ref 70–99)
Glucose-Capillary: 161 mg/dL — ABNORMAL HIGH (ref 70–99)
Glucose-Capillary: 170 mg/dL — ABNORMAL HIGH (ref 70–99)
Glucose-Capillary: 217 mg/dL — ABNORMAL HIGH (ref 70–99)

## 2020-03-02 LAB — CBC
HCT: 21.7 % — ABNORMAL LOW (ref 36.0–46.0)
Hemoglobin: 7.3 g/dL — ABNORMAL LOW (ref 12.0–15.0)
MCH: 30.8 pg (ref 26.0–34.0)
MCHC: 33.6 g/dL (ref 30.0–36.0)
MCV: 91.6 fL (ref 80.0–100.0)
Platelets: 185 10*3/uL (ref 150–400)
RBC: 2.37 MIL/uL — ABNORMAL LOW (ref 3.87–5.11)
RDW: 14.4 % (ref 11.5–15.5)
WBC: 6.2 10*3/uL (ref 4.0–10.5)
nRBC: 0 % (ref 0.0–0.2)

## 2020-03-02 LAB — PREPARE RBC (CROSSMATCH)

## 2020-03-02 MED ORDER — INSULIN GLARGINE 100 UNIT/ML ~~LOC~~ SOLN
15.0000 [IU] | Freq: Every day | SUBCUTANEOUS | Status: DC
  Administered 2020-03-02 – 2020-03-03 (×2): 15 [IU] via SUBCUTANEOUS
  Filled 2020-03-02 (×3): qty 0.15

## 2020-03-02 MED ORDER — INSULIN ASPART 100 UNIT/ML ~~LOC~~ SOLN
4.0000 [IU] | Freq: Three times a day (TID) | SUBCUTANEOUS | Status: DC
  Administered 2020-03-02 – 2020-03-07 (×15): 4 [IU] via SUBCUTANEOUS

## 2020-03-02 NOTE — Evaluation (Signed)
Physical Therapy Evaluation Patient Details Name: Jessica Dunlap MRN: [id number] DOB: 13-Mar-1956 Today's Date: 03/02/2020   History of Present Illness  Pt is a 64 y.o. F with significant PMH of diabetes mellitus type 2, uncontrolled with hyperglycemia and neuropathy, hypertension, CAD, nonalcoholic cirrhosis who was admitted due to left foot diabetic ulcer as well as DKA. 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   Clinical Impression  Prior to admission, pt lives with her son, is ambulatory with a cane, independent with ADL's and enjoys painting. On PT evaluation, pt very lethargic and requires verbal stimulation to initiate tasks; however, she is agreeable to therapy evaluation. Hemoglobin was lower (7.3) and currently receiving transfusion. Pt requiring min assist for bed mobility, moderate assist for transfers to standing. Attempted hopping in place, but pt with knee buckle, necessitating sitting back down on edge of bed. Transfer to chair deferred at this point due to level of arousal. Think pt will progress well based on PLOF and age. Recommending CIR to maximize functional mobility.     Follow Up Recommendations CIR    Equipment Recommendations  Other (comment) (defer)    Recommendations for Other Services Rehab consult     Precautions / Restrictions Precautions Precautions: Fall Restrictions Weight Bearing Restrictions: Yes LLE Weight Bearing: Non weight bearing      Mobility  Bed Mobility Overal bed mobility: Needs Assistance Bed Mobility: Supine to Sit;Sit to Supine     Supine to sit: Min assist Sit to supine: Min assist   General bed mobility comments: MinA for trunk elevation to upright and trunk guidance back down into supine.  Transfers Overall transfer level: Needs assistance Equipment used: Rolling walker (2 wheeled) Transfers: Sit to/from Stand Sit to Stand: Mod assist         General transfer comment: ModA to rise from edge of bed, max cues for hand  placement and placing RLE posteriorly. With attempt at hopping, pt with knee buckle requiring sitting back down on edge of bed  Ambulation/Gait             General Gait Details: unable  Stairs            Wheelchair Mobility    Modified Rankin (Stroke Patients Only)       Balance Overall balance assessment: Needs assistance Sitting-balance support: Feet unsupported Sitting balance-Leahy Scale: Poor Sitting balance - Comments: Intermittently posterior lean requiring min-modA to correct   Standing balance support: Bilateral upper extremity supported Standing balance-Leahy Scale: Poor Standing balance comment: reliant on external support                             Pertinent Vitals/Pain Pain Assessment: Faces Faces Pain Scale: Hurts little more Pain Location: L residual limb with movement Pain Descriptors / Indicators: Operative site guarding;Grimacing Pain Intervention(s): Limited activity within patient's tolerance;Monitored during session    Home Living Family/patient expects to be discharged to:: Private residence Living Arrangements: Children (son) Available Help at Discharge: Family Type of Home: House Home Access: Stairs to enter Entrance Stairs-Rails: Psychiatric nurse of Steps: 2   Home Equipment: Cane - single point;Shower seat      Prior Function Level of Independence: Independent with assistive device(s)         Comments: Uses cane, does not work, enjoys Dealer        Extremity/Trunk Assessment   Upper Extremity Assessment Upper Extremity Assessment: Defer  to OT evaluation    Lower Extremity Assessment Lower Extremity Assessment: LLE deficits/detail LLE Deficits / Details: s/p BKA. able to reach neutral extension. Could not formally assess due to lethargy    Cervical / Trunk Assessment Cervical / Trunk Assessment: Normal  Communication   Communication: No difficulties   Cognition Arousal/Alertness: Lethargic Behavior During Therapy: Flat affect Overall Cognitive Status: Impaired/Different from baseline Area of Impairment: Orientation;Attention;Following commands;Safety/judgement;Awareness;Problem solving                 Orientation Level: Disoriented to;Time Current Attention Level: Focused   Following Commands: Follows one step commands inconsistently Safety/Judgement: Decreased awareness of safety;Decreased awareness of deficits Awareness: Intellectual Problem Solving: Decreased initiation;Requires verbal cues General Comments: Pt very lethargic, difficulty keeping eyes open, requires verbal stimulation to perform motor tasks. Not oriented to year, stating it was "79."       General Comments      Exercises Amputee Exercises Hip Flexion/Marching: Left;Seated;5 reps   Assessment/Plan    PT Assessment Patient needs continued PT services  PT Problem List Decreased strength;Decreased activity tolerance;Decreased balance;Decreased mobility;Decreased cognition;Decreased safety awareness;Pain       PT Treatment Interventions DME instruction;Gait training;Functional mobility training;Therapeutic exercise;Therapeutic activities;Stair training;Balance training;Patient/family education;Wheelchair mobility training    PT Goals (Current goals can be found in the Care Plan section)  Acute Rehab PT Goals Patient Stated Goal: did not state PT Goal Formulation: With patient Time For Goal Achievement: 03/16/20 Potential to Achieve Goals: Good    Frequency Min 3X/week   Barriers to discharge        Co-evaluation               AM-PAC PT "6 Clicks" Mobility  Outcome Measure Help needed turning from your back to your side while in a flat bed without using bedrails?: A Little Help needed moving from lying on your back to sitting on the side of a flat bed without using bedrails?: A Little Help needed moving to and from a bed to a chair  (including a wheelchair)?: A Lot Help needed standing up from a chair using your arms (e.g., wheelchair or bedside chair)?: A Lot Help needed to walk in hospital room?: Total Help needed climbing 3-5 steps with a railing? : Total 6 Click Score: 12    End of Session Equipment Utilized During Treatment: Gait belt Activity Tolerance: Patient limited by lethargy Patient left: in bed;with call bell/phone within reach;with bed alarm set Nurse Communication: Mobility status PT Visit Diagnosis: Pain;Difficulty in walking, not elsewhere classified (R26.2);Other abnormalities of gait and mobility (R26.89);Unsteadiness on feet (R26.81) Pain - Right/Left: Left Pain - part of body:  (residual limb)    Time: 6808-8110 PT Time Calculation (min) (ACUTE ONLY): 17 min   Charges:   PT Evaluation $PT Eval Moderate Complexity: 1 Mod            Jessica Dunlap, PT, DPT Acute Rehabilitation Services Pager [phone] Office [phone]   Jessica Dunlap 03/02/2020, 1:37 PM

## 2020-03-02 NOTE — Social Work (Signed)
CSW acknowledging consult for "inpatient rehab". CIR coordinator is following for further recs and work up.  TOC team will also follow for therapy recommendations needed to best determine disposition/for insurance authorization.   Westley Hummer, MSW, [place] Work

## 2020-03-02 NOTE — Progress Notes (Signed)
Inpatient Rehabilitation Admissions Coordinator  Inpatient rehab consult received. I await therapy evaluations to assist with planning dispo options. I met at bedside with patient and notified Nursing that patient with blood leaking from IV during her transfusion. I will follow .  Danne Baxter, RN, MSN Rehab Admissions Coordinator [phone] 03/02/2020 11:08 AM

## 2020-03-02 NOTE — Progress Notes (Signed)
Patient ID: Jessica Dunlap, female   DOB: 06-Feb-1956, 64 y.o.   MRN: [id number]  Progress Note    03/02/2020 7:04 AM 1 Day Post-Op  Subjective: Drowsy but arousable.  Reports pain is under control.   Vitals:   03/02/20 0252 03/02/20 0540  BP: (!) 143/73 136/62  Pulse: 96 96  Resp: 15 16  Temp: 99.3 F (37.4 C) 99.6 F (37.6 C)  SpO2: 99% 93%   Physical Exam: Left below-knee amputation dressing intact with no bleeding  CBC    Component Value Date/Time   WBC 4.8 03/01/2020 1222   RBC 2.49 (L) 03/01/2020 1222   HGB 7.4 (L) 03/01/2020 1222   HCT 22.4 (L) 03/01/2020 1222   PLT 172 03/01/2020 1222   MCV 90.0 03/01/2020 1222   MCH 29.7 03/01/2020 1222   MCHC 33.0 03/01/2020 1222   RDW 14.0 03/01/2020 1222   LYMPHSABS 0.5 (L) 02/26/2020 1725   MONOABS 0.9 02/26/2020 1725   EOSABS 0.0 02/26/2020 1725   BASOSABS 0.0 02/26/2020 1725    BMET    Component Value Date/Time   NA 138 03/01/2020 0924   K 3.6 03/01/2020 0924   CL 101 03/01/2020 0924   CO2 22 03/01/2020 0352   GLUCOSE 176 (H) 03/01/2020 0924   BUN <3 (L) 03/01/2020 0924   CREATININE 0.69 03/01/2020 1222   CALCIUM 8.6 (L) 03/01/2020 0352   GFRNONAA >60 03/01/2020 1222   GFRAA >60 03/01/2020 1222    INR    Component Value Date/Time   INR 1.95 (H) 09/06/2010 0549     Intake/Output Summary (Last 24 hours) at 03/02/2020 0704 Last data filed at 03/01/2020 1839 Gross per 24 hour  Intake 1440 ml  Output 150 ml  Net 1290 ml     Assessment/Plan:  64 y.o. female stable postop day #1.  PT and CIR evaluation pending.  Hematocrit 24 preop and 22 postoperative yesterday.  Pulse rate in the 90s and blood pressure in the 163A systolic.  Repeat CBC pending this morning.  Discussed with the patient may need transfusion.     [name], MD FACS [facility] [phone] 03/02/2020 7:04 AM

## 2020-03-02 NOTE — Progress Notes (Signed)
Patient ID: Jessica Dunlap, female   DOB: 1955/12/21, 64 y.o.   MRN: [id number]  PROGRESS NOTE    Jessica Dunlap  MPN:[id number] DOB: 11/19/1955 DOA: 02/26/2020 PCP: Redmond School, MD    Brief Narrative:  Jessica Dunlap a 64 year old female with past medical history significant for type 2 diabetes, uncontrolled with hyperglycemia and neuropathy, hypertension, hypothyroidism, depression, CAD, nonalcoholic cirrhosis, who was admitted on 02/26/2019 due to left foot diabetic ulcer as well as DKA. Patient was initially started on insulin drip which was transitioned further to subcutaneous Lantus and sliding scale insulin. Vascular surgery as well as general surgery was consulted due to her left diabetic foot ulcer. Patient was transferred to [facility] for further vascular surgery evaluation.   Assessment & Plan:   Principal Problem:   DKA, type 2 ([place]) Active Problems:   Uncontrolled type 2 diabetes with neuropathy ([place])   Hypothyroidism   Hypertension   Coronary artery disease   Cirrhosis, non-alcoholic (HCC)   Diabetic foot ulcer ([place])   DKA (diabetic ketoacidoses) ([place])   Diabetic ulcer of foot associated with diabetes mellitus due to underlying condition, limited to breakdown of skin ([place])  DKA -Hemoglobin A1c 6.7  -Transitioned off IV insulin -Lantus, NovoLog, sliding scale. Dose increased today to 15 Lantus and 4 un Novolog with meals. Continue novolog -Appreciate diabetic coordinator  Left diabetic foot ulcer/osteomyelitis  -Failed outpatient oral antibiotic treatment, Augmentin -Wound culture from 02/18/2020 with group B strept agalactiae -Wound culture from 02/26/2020 pending -Continue Rocephin, Flagyl -MRI revealed lateral plantar base of the fifth metatarsal fluid collection likely abscess as well as acute osteomyelitis involving plantar base of fifth metatarsal -ABI normal bilaterally -Vascular surgery completed BKA 9/1  Hypothyroidism -Continue  Synthroid  CAD -Continue aspirin, metoprolol  Essential hypertension -Continue metoprolol, Cardizem   Chronic pain syndrome -Continue MS Contin, dilaudid. Reviewed PDMP  Worsening pain post surgery-->will add Lyrica--this helped   DVT prophylaxis: Lovenox SQ Code Status: Full code  Family Communication: patient at bedside Disposition Plan: CIR  Remains inpatient appropriate because:Inpatient level of care appropriate due to severity of illness  Consultants:   Vascular surgery  Procedures:  Left BKA  Antimicrobials: Anti-infectives (From admission, onward)   Start     Dose/Rate Route Frequency Ordered Stop   03/01/20 2200  ceFAZolin (ANCEF) IVPB 2g/100 mL premix        2 g 200 mL/hr over 30 Minutes Intravenous Every 8 hours 03/01/20 1159 03/02/20 0512   02/26/20 2220  cefTRIAXone (ROCEPHIN) 2 g in sodium chloride 0.9 % 100 mL IVPB  Status:  Discontinued       "And" Linked Group Details   2 g 200 mL/hr over 30 Minutes Intravenous Every 24 hours 02/26/20 2220 03/01/20 1159   02/26/20 2220  metroNIDAZOLE (FLAGYL) IVPB 500 mg  Status:  Discontinued       "And" Linked Group Details   500 mg 100 mL/hr over 60 Minutes Intravenous Every 8 hours 02/26/20 2220 03/01/20 1159   02/26/20 1715  piperacillin-tazobactam (ZOSYN) IVPB 3.375 g        3.375 g 100 mL/hr over 30 Minutes Intravenous  Once 02/26/20 1701 02/26/20 1809       Subjective: Feels ok today. Pain is better controlled.  Objective: Vitals:   03/02/20 0540 03/02/20 1017 03/02/20 1044 03/02/20 1300  BP: 136/62 122/64  (!) 132/58  Pulse: 96 83 79 87  Resp: 16 16 16 17   Temp: 99.6 F (37.6 C) 98.5 F (36.9 C)  98.5 F (36.9 C) 98.6 F (37 C)  TempSrc: Oral Oral Oral Oral  SpO2: 93% 95% 98% 100%  Weight:      Height:        Intake/Output Summary (Last 24 hours) at 03/02/2020 1413 Last data filed at 03/02/2020 1300 Gross per 24 hour  Intake 840 ml  Output 600 ml  Net 240 ml   Filed Weights    02/26/20 1645  Weight: 59.4 kg    Examination:  General exam: Appears calm and comfortable  Respiratory system: Clear to auscultation. Respiratory effort normal. Cardiovascular system: S1 & S2 heard, RRR.  Gastrointestinal system: Abdomen is nondistended, soft and nontender.  Central nervous system: Alert and oriented. No focal neurological deficits. Extremities: ACE wrap on left BKA Skin: No rashes Psychiatry: Judgement and insight appear normal. Mood & affect appropriate.     Data Reviewed: I have personally reviewed following labs and imaging studies  CBC: Recent Labs  Lab 02/26/20 1725 02/26/20 1725 02/29/20 0148 03/01/20 0352 03/01/20 0924 03/01/20 1222 03/02/20 0637  WBC 16.0*  --  4.3 5.3  --  4.8 6.2  NEUTROABS 14.4*  --   --   --   --   --   --   HGB 11.4*   < > 7.8* 8.1* 8.2* 7.4* 7.3*  HCT 32.8*   < > 23.5* 24.3* 24.0* 22.4* 21.7*  MCV 88.9  --  90.7 89.3  --  90.0 91.6  PLT 222  --  163 180  --  172 185   < > = values in this interval not displayed.   Basic Metabolic Panel: Recent Labs  Lab 02/27/20 0548 02/27/20 0548 02/28/20 0107 02/28/20 0107 02/29/20 0148 03/01/20 0352 03/01/20 0924 03/01/20 1222 03/02/20 0637  NA 133*   < > 132*  --  136 136 138  --  136  K 3.9   < > 3.6  --  3.9 3.6 3.6  --  3.2*  CL 101   < > 100  --  105 103 101  --  102  CO2 21*  --  22  --  21* 22  --   --  23  GLUCOSE 252*   < > 321*  --  224* 162* 176*  --  188*  BUN 9   < > <5*  --  <5* <5* <3*  --  <5*  CREATININE 0.62   < > 0.68   < > 0.88 0.71 0.50 0.69 0.76  CALCIUM 8.0*  --  7.7*  --  8.2* 8.6*  --   --  8.5*   < > = values in this interval not displayed.   GFR: Estimated Creatinine Clearance: 58.8 mL/min (by C-G formula based on SCr of 0.76 mg/dL). Liver Function Tests: Recent Labs  Lab 02/26/20 1725  AST 14*  ALT 11  ALKPHOS 61  BILITOT 1.1  PROT 8.3*  ALBUMIN 3.9   CBG: Recent Labs  Lab 03/01/20 1200 03/01/20 1701 03/01/20 2053  03/02/20 0738 03/02/20 1238  GLUCAP 183* 165* 168* 214* 161*   Sepsis Labs: Recent Labs  Lab 02/26/20 1725  LATICACIDVEN 3.9*    Recent Results (from the past 240 hour(s))  Wound or Superficial Culture     Status: None   Collection Time: 02/26/20  5:01 PM   Specimen: Ulcer; Wound  Result Value Ref Range Status   Specimen Description   Final    ULCER Performed at [facility], [address], [place], [place]  [address]    Special Requests   Final    NONE Performed at [facility], [address] [place], [place] [address]    Gram Stain   Final    FEW WBC PRESENT, PREDOMINANTLY PMN FEW GRAM NEGATIVE RODS RARE GRAM POSITIVE COCCI IN CLUSTERS    Culture   Final    RARE STREPTOCOCCUS AGALACTIAE RARE ESCHERICHIA COLI TESTING AGAINST S. AGALACTIAE NOT ROUTINELY PERFORMED DUE TO PREDICTABILITY OF AMP/PEN/VAN SUSCEPTIBILITY. Performed at [facility], [place] [address] [place], [place] [address]    Report Status 02/29/2020 FINAL  Final   Organism ID, Bacteria ESCHERICHIA COLI  Final      Susceptibility   Escherichia coli - MIC*    AMPICILLIN 16 INTERMEDIATE Intermediate     CEFAZOLIN <=4 SENSITIVE Sensitive     CEFEPIME <=0.12 SENSITIVE Sensitive     CEFTAZIDIME <=1 SENSITIVE Sensitive     CEFTRIAXONE <=0.25 SENSITIVE Sensitive     CIPROFLOXACIN 0.5 SENSITIVE Sensitive     GENTAMICIN 4 SENSITIVE Sensitive     IMIPENEM 0.5 SENSITIVE Sensitive     TRIMETH/SULFA >=320 RESISTANT Resistant     AMPICILLIN/SULBACTAM <=2 SENSITIVE Sensitive     PIP/TAZO <=4 SENSITIVE Sensitive     * RARE ESCHERICHIA COLI  SARS Coronavirus 2 by RT PCR (hospital order, performed in [place] hospital lab) Nasopharyngeal Nasopharyngeal Swab     Status: None   Collection Time: 02/26/20  6:22 PM   Specimen: Nasopharyngeal Swab  Result Value Ref Range Status   SARS Coronavirus 2 NEGATIVE NEGATIVE Final    Comment: (NOTE) SARS-CoV-2 target nucleic acids are NOT DETECTED.  The SARS-CoV-2 RNA  is generally detectable in upper and lower respiratory specimens during the acute phase of infection. The lowest concentration of SARS-CoV-2 viral copies this assay can detect is 250 copies / mL. A negative result does not preclude SARS-CoV-2 infection and should not be used as the sole basis for treatment or other patient management decisions.  A negative result may occur with improper specimen collection / handling, submission of specimen other than nasopharyngeal swab, presence of viral mutation(s) within the areas targeted by this assay, and inadequate number of viral copies (<250 copies / mL). A negative result must be combined with clinical observations, patient history, and epidemiological information.  Fact Sheet for Patients:   StrictlyIdeas.no  Fact Sheet for Healthcare Providers: BankingDealers.co.za  This test is not yet approved or  cleared by the [place] FDA and has been authorized for detection and/or diagnosis of SARS-CoV-2 by FDA under an Emergency Use Authorization (EUA).  This EUA will remain in effect (meaning this test can be used) for the duration of the COVID-19 declaration under Section 564(b)(1) of the Act, 21 U.S.C. section 360bbb-3(b)(1), unless the authorization is terminated or revoked sooner.  Performed at [facility], [address] [place], [place] [address]   Blood Cultures x 2 sites     Status: None   Collection Time: 02/26/20 11:22 PM   Specimen: BLOOD  Result Value Ref Range Status   Specimen Description BLOOD LEFT ANTECUBITAL  Final   Special Requests   Final    BOTTLES DRAWN AEROBIC AND ANAEROBIC Blood Culture adequate volume   Culture   Final    NO GROWTH 5 DAYS Performed at [facility], [address], [place], [place] [address]    Report Status 03/02/2020 FINAL  Final  Blood Cultures x 2 sites     Status: None   Collection Time: 02/26/20  11:33 PM   Specimen: BLOOD LEFT HAND  Result  Value Ref Range Status   Specimen Description BLOOD LEFT HAND  Final   Special Requests AEROBIC BOTTLE ONLY Blood Culture adequate volume  Final   Culture   Final    NO GROWTH 5 DAYS Performed at [facility], [address], [place], [place] [address]    Report Status 03/02/2020 FINAL  Final  Surgical pcr screen     Status: None   Collection Time: 03/01/20  1:11 AM   Specimen: Nasal Mucosa; Nasal Swab  Result Value Ref Range Status   MRSA, PCR NEGATIVE NEGATIVE Final   Staphylococcus aureus NEGATIVE NEGATIVE Final    Comment: (NOTE) The Xpert SA Assay (FDA approved for NASAL specimens in patients 12 years of age and older), is one component of a comprehensive surveillance program. It is not intended to diagnose infection nor to guide or monitor treatment. Performed at [facility], [place] [address] [place], [place] [address]       Radiology Studies: No results found.   Scheduled Meds: . aspirin EC  81 mg Oral Daily  . carbamazepine  100 mg Oral BID  . diltiazem  120 mg Oral Daily  . docusate sodium  100 mg Oral Daily  . DULoxetine  30 mg Oral QHS  . DULoxetine  60 mg Oral QHS  . enoxaparin (LOVENOX) injection  40 mg Subcutaneous Q24H  . HYDROmorphone  16 mg Oral BID  . HYDROmorphone  24 mg Oral Daily  . insulin aspart  0-15 Units Subcutaneous TID WC  . insulin aspart  0-5 Units Subcutaneous QHS  . insulin aspart  3 Units Subcutaneous TID WC  . insulin glargine  12 Units Subcutaneous QHS  . levothyroxine  200 mcg Oral Q0600  . metoprolol tartrate  50 mg Oral BID  . morphine  30 mg Oral QHS  . multivitamin with minerals  1 tablet Oral Daily  . nutrition supplement (JUVEN)  1 packet Oral BID BM  . pantoprazole  40 mg Oral Daily  . pregabalin  75 mg Oral Daily  . Ensure Max Protein  11 oz Oral QHS  . traZODone  150 mg Oral QHS   Continuous Infusions: . magnesium sulfate bolus IVPB       LOS: 5 days    Donnamae Jude, MD 03/02/2020 2:13  PM [phone] [facility] If 7PM-7AM, please contact night-coverage 03/02/2020, 2:13 PM

## 2020-03-02 NOTE — Evaluation (Signed)
Occupational Therapy Evaluation Patient Details Name: Jessica Dunlap MRN: [id number] DOB: [birth year] Today's Date: 03/02/2020    History of Present Illness Pt is a 64 y.o. F with significant PMH of diabetes mellitus type 2, uncontrolled with hyperglycemia and neuropathy, hypertension, CAD, nonalcoholic cirrhosis who was admitted due to left foot diabetic ulcer as well as DKA. 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    Clinical Impression   Patient remains lethargic, received in bed.  Squat pivot status communicated to nursing.  PLOF she used [facility] for mobility, but was Ind with all ADL/IADL.  Currently she needs constant cueing, SBA, min guarding and min to mod A for UB ADL, Mod to Max A for LB ADL, near Dep for toileting and Mod A for a squat pivot transfer.  Continue to follow patient in the acute setting, OT recommends CIR to max functional status for a return home.      Follow Up Recommendations  CIR    Equipment Recommendations  3 in 1 bedside commode;Tub/shower bench;Wheelchair (measurements OT);Wheelchair cushion (measurements OT)    Recommendations for Other Services Rehab consult     Precautions / Restrictions Precautions Precautions: Fall Restrictions Weight Bearing Restrictions: Yes LLE Weight Bearing: Non weight bearing Other Position/Activity Restrictions: difficulty with L knee extension      Mobility Bed Mobility Overal bed mobility: Needs Assistance Bed Mobility: Supine to Sit     Supine to sit: Mod assist Sit to supine: Min assist   General bed mobility comments: MinA for trunk elevation to upright and trunk guidance back down into supine.  Transfers Overall transfer level: Needs assistance Equipment used: Rolling walker (2 wheeled) Transfers: Squat Pivot Transfers Sit to Stand: Mod assist   Squat pivot transfers: Mod assist     General transfer comment: Cued to reach for arm rest to initiate forward lean prior to squat pivot.    Balance Overall  balance assessment: Needs assistance Sitting-balance support: Feet supported;Bilateral upper extremity supported Sitting balance-Leahy Scale: Fair Sitting balance - Comments: Intermittently posterior lean requiring min-modA to correct Postural control: Posterior lean;Right lateral lean Standing balance support: Bilateral upper extremity supported Standing balance-Leahy Scale: Poor Standing balance comment: reliant on external support                           ADL either performed or assessed with clinical judgement   ADL Overall ADL's : Needs assistance/impaired     Grooming: Wash/dry hands;Wash/dry face;Set up;Bed level   Upper Body Bathing: Minimal assistance;Bed level   Lower Body Bathing: Moderate assistance;Bed level   Upper Body Dressing : Moderate assistance;Sitting   Lower Body Dressing: Maximal assistance;Bed level                       Vision Baseline Vision/History: Wears glasses Wears Glasses: Reading only Patient Visual Report: No change from baseline                  Pertinent Vitals/Pain Pain Assessment: 0-10 Pain Score: 4  Faces Pain Scale: Hurts little more Pain Location: L residual limb with movement Pain Descriptors / Indicators: Operative site guarding;Grimacing Pain Intervention(s): Limited activity within patient's tolerance;Monitored during session     Hand Dominance Right   Extremity/Trunk Assessment Upper Extremity Assessment Upper Extremity Assessment: Generalized weakness   Lower Extremity Assessment Lower Extremity Assessment: Defer to PT evaluation LLE Deficits / Details: s/p BKA. able to reach neutral extension. Could not formally assess  due to lethargy   Cervical / Trunk Assessment Cervical / Trunk Assessment: Normal   Communication Communication Communication: No difficulties   Cognition Arousal/Alertness: Lethargic Behavior During Therapy: Flat affect Overall Cognitive Status: Impaired/Different from  baseline Area of Impairment: Orientation;Attention;Following commands;Safety/judgement;Awareness;Problem solving                 Orientation Level: Disoriented to;Time Current Attention Level: Focused   Following Commands: Follows one step commands inconsistently Safety/Judgement: Decreased awareness of safety;Decreased awareness of deficits Awareness: Intellectual Problem Solving: Decreased initiation;Requires verbal cues General Comments: continues to be letharigc, eyes closing, increased LOA with sitting EOB.         Exercises Exercises: Amputee Amputee Exercises Hip Flexion/Marching: Left;Seated;5 reps   Shoulder Instructions      Home Living Family/patient expects to be discharged to:: Private residence Living Arrangements: Children Available Help at Discharge: Family Type of Home: House Home Access: Stairs to enter Technical brewer of Steps: 2 Entrance Stairs-Rails: Right;Left       Bathroom Shower/Tub: Occupational psychologist: [place]: Cane - single point;Shower seat          Prior Functioning/Environment Level of Independence: Independent with assistive device(s)        Comments: Uses cane, does not work, cares for MR child        OT Problem List: Decreased strength;Decreased activity tolerance;Impaired balance (sitting and/or standing);Decreased safety awareness;Decreased knowledge of use of DME or AE;Pain      OT Treatment/Interventions: Self-care/ADL training;Therapeutic exercise;Neuromuscular education;Energy conservation;DME and/or AE instruction;Therapeutic activities;Balance training    OT Goals(Current goals can be found in the care plan section) Acute Rehab OT Goals Patient Stated Goal: Patient wants to move better so she can return home. OT Goal Formulation: With patient Potential to Achieve Goals: Good ADL Goals Pt Will Perform Grooming: with set-up;with supervision;with min guard  assist;sitting Pt Will Perform Upper Body Bathing: with set-up;with supervision;with min guard assist;sitting Pt Will Perform Lower Body Bathing: with set-up;with supervision;with min guard assist;with min assist;with mod assist;sit to/from stand Pt Will Perform Upper Body Dressing: with set-up;with supervision;with min guard assist;sitting Pt Will Perform Lower Body Dressing: with set-up;with supervision;with min guard assist;with min assist;with mod assist;sit to/from stand Pt Will Transfer to Toilet: with set-up;with supervision;with min guard assist;with min assist;stand pivot transfer Pt Will Perform Toileting - Clothing Manipulation and hygiene: with set-up;with supervision;with min guard assist;with min assist;sitting/lateral leans Pt/caregiver will Perform Home Exercise Program: Increased strength;Both right and left upper extremity;With theraband;With written HEP provided  OT Frequency: Min 2X/week   Barriers to D/C: Decreased caregiver support                        AM-PAC OT "6 Clicks" Daily Activity     Outcome Measure Help from another person eating meals?: None Help from another person taking care of personal grooming?: A Little Help from another person toileting, which includes using toliet, bedpan, or urinal?: A Lot Help from another person bathing (including washing, rinsing, drying)?: A Lot Help from another person to put on and taking off regular upper body clothing?: A Lot Help from another person to put on and taking off regular lower body clothing?: A Lot 6 Click Score: 15   End of Session Equipment Utilized During Treatment: Gait belt;Rolling walker Nurse Communication: Mobility status  Activity Tolerance: Patient limited by fatigue;Patient limited by lethargy;Patient limited by pain Patient left: in chair;with call bell/phone within reach;with  chair alarm set;with family/visitor present  OT Visit Diagnosis: Unsteadiness on feet (R26.81);Other  abnormalities of gait and mobility (R26.89);Muscle weakness (generalized) (M62.81);Pain Pain - Right/Left: Left Pain - part of body: Leg                Time: 1446-1511 OT Time Calculation (min): 25 min Charges:  OT General Charges $OT Visit: 1 Visit OT Evaluation $OT Eval Moderate Complexity: 1 Mod  03/02/2020  Rich, OTR/L  Acute Rehabilitation Services  Office:  [phone]   Metta Clines 03/02/2020, 3:28 PM

## 2020-03-03 LAB — DIFFERENTIAL
Abs Immature Granulocytes: 0 10*3/uL (ref 0.00–0.07)
Band Neutrophils: 0 %
Basophils Absolute: 0.1 10*3/uL (ref 0.0–0.1)
Basophils Relative: 1 %
Blasts: 0 %
Eosinophils Absolute: 0.1 10*3/uL (ref 0.0–0.5)
Eosinophils Relative: 2 %
Lymphocytes Relative: 18 %
Lymphs Abs: 1 10*3/uL (ref 0.7–4.0)
Metamyelocytes Relative: 0 %
Monocytes Absolute: 0.7 10*3/uL (ref 0.1–1.0)
Monocytes Relative: 12 %
Myelocytes: 0 %
Neutro Abs: 3.9 10*3/uL (ref 1.7–7.7)
Neutrophils Relative %: 67 %
Other: 0 %
Promyelocytes Relative: 0 %
nRBC: 0 /100 WBC

## 2020-03-03 LAB — BASIC METABOLIC PANEL
Anion gap: 6 (ref 5–15)
BUN: <5 mg/dL — ABNORMAL LOW (ref 8–23)
CO2: 25 mmol/L (ref 22–32)
Calcium: 8.4 mg/dL — ABNORMAL LOW (ref 8.9–10.3)
Chloride: 104 mmol/L (ref 98–111)
Creatinine, Ser: 0.68 mg/dL (ref 0.44–1.00)
GFR calc Af Amer: >60 mL/min (ref >60)
GFR calc non Af Amer: >60 mL/min (ref >60)
Glucose, Bld: 216 mg/dL — ABNORMAL HIGH (ref 70–99)
Potassium: 3.1 mmol/L — ABNORMAL LOW (ref 3.5–5.1)
Sodium: 135 mmol/L (ref 135–145)

## 2020-03-03 LAB — CBC
HCT: 25.2 % — ABNORMAL LOW (ref 36.0–46.0)
Hemoglobin: 8.4 g/dL — ABNORMAL LOW (ref 12.0–15.0)
MCH: 30.2 pg (ref 26.0–34.0)
MCHC: 33.3 g/dL (ref 30.0–36.0)
MCV: 90.6 fL (ref 80.0–100.0)
Platelets: 199 10*3/uL (ref 150–400)
RBC: 2.78 MIL/uL — ABNORMAL LOW (ref 3.87–5.11)
RDW: 14.4 % (ref 11.5–15.5)
WBC: 5.8 10*3/uL (ref 4.0–10.5)
nRBC: 0 % (ref 0.0–0.2)

## 2020-03-03 LAB — GLUCOSE, CAPILLARY
Glucose-Capillary: 227 mg/dL — ABNORMAL HIGH (ref 70–99)
Glucose-Capillary: 201 mg/dL — ABNORMAL HIGH (ref 70–99)
Glucose-Capillary: 258 mg/dL — ABNORMAL HIGH (ref 70–99)
Glucose-Capillary: 229 mg/dL — ABNORMAL HIGH (ref 70–99)

## 2020-03-03 MED ORDER — POTASSIUM CHLORIDE CRYS ER 20 MEQ PO TBCR
40.0000 meq | EXTENDED_RELEASE_TABLET | ORAL | Status: AC
  Administered 2020-03-03: 40 meq via ORAL
  Filled 2020-03-03 (×2): qty 2

## 2020-03-03 MED ORDER — POLYETHYLENE GLYCOL (MIRALAX) NICU SYRINGE 0.34GM/ML: 17.0000 g | Freq: Every day | ORAL | Status: DC

## 2020-03-03 MED ORDER — POLYETHYLENE GLYCOL 3350 17 G PO PACK
17.0000 g | PACK | Freq: Every day | ORAL | Status: DC
  Administered 2020-03-03 – 2020-03-09 (×3): 17 g via ORAL
  Filled 2020-03-03 (×10): qty 1

## 2020-03-03 NOTE — Social Work (Signed)
To Whom it May Concern, Please be advised that the above named patient will require a short term nursing home stay. Anticipated to be 30 days or less for rehabilitation and strengthening. The plan is for return home.    Jessica Dunlap Jan 06, 1956

## 2020-03-03 NOTE — Consult Note (Signed)
PV Navigator consult acknowledged and chart has been reviewed. I was able to meet with patient at bedside this am to introduce myself, my role and answer and any questions.   64 y/o female admitted 02/26/20 with infected, non-healing left foot diabetic foot ulcer being followed at [place]. Positive MRI for osteomyelitis resulting in left below the knee amputation on 03/01/20 with Dr Donnetta Hutching. Patient currently POD #2. She is drowsy this am but easily aroused. States pain meds are effective as long as she gets them as scheduled. Dressings/ACE bandage intact to left lower leg.   Patient says she lives at home with her  28y/o autistic son who has the mental capability of a 74 y/o. Son is currently staying with his father and her sister. Patient states she is being evaluated for CIR and is hopeful that she is approved, however ultimately plans to return home to care for her son. She is also hopeful that she may eventually be fitted for a prosthetic to help her walk. She was completely independent with the exception of transportation, prior to her surgery. She has always relied on her sister for transportation to appointments in the past and she states "sometimes she can take me, and sometimes she can't".  She reports she gets all medications delivered by [facility] and denies issues obtaining prescriptions.    Medical history includes: DM-2 with neuropathy, HTN, Hypothyroidism, CAD and former smoker (Quit 12 years a go 45pk per year cigarettes)   Awaiting disposition recommendations. Will need transportation assistance once discharged back home for follow up appointments. She is scheduled for follow up at [facility] 03/27/20 with Dr Early and patient is aware. She denies other barriers/concerns at this time.  Discussed importance of diabetic foot care to right foot and daily inspections, appropriate foot wear and keeping skin clean/dry. Patient voices her understanding and comprehends the importance of  notifying MD should she experience discoloration, pain, break in skin, fever as soon as possible.  Contact information shared with patient and encouraged her to call should she need assistance or have questions.   Thank you for the opportunity to participate in this patient's care.  Cletis Media RN BSN [place] Navigator [place] [phone]

## 2020-03-03 NOTE — Progress Notes (Addendum)
[facility]  Subjective  - Doing OK, still having significant pain with movement in the stump on the left LE.   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  Stump appears viable Lungs non labored breathing  Assessment/Planning: POD # 2 left BKA  Stump appears viable Pending CIR  Roxy Horseman 03/03/2020 7:20 AM --  Laboratory Lab Results: Recent Labs    03/01/20 1222 03/02/20 0637  WBC 4.8 6.2  HGB 7.4* 7.3*  HCT 22.4* 21.7*  PLT 172 185   BMET Recent Labs    03/01/20 0352 03/01/20 0352 03/01/20 0924 03/01/20 0924 03/01/20 1222 03/02/20 0637  NA 136   < > 138  --   --  136  K 3.6   < > 3.6  --   --  3.2*  CL 103   < > 101  --   --  102  CO2 22  --   --   --   --  23  GLUCOSE 162*   < > 176*  --   --  188*  BUN <5*   < > <3*  --   --  <5*  CREATININE 0.71   < > 0.50   < > 0.69 0.76  CALCIUM 8.6*  --   --   --   --  8.5*   < > = values in this interval not displayed.    COAG Lab Results  Component Value Date   INR 1.95 (H) 09/06/2010   INR 1.72 (H) 09/04/2010   INR 1.3 07/25/2008   No results found for: PTT  I have examined the patient, reviewed and agree with above.  Hemoglobin 7.3 and hematocrit 21.7.  Asymptomatic.  Defer to primary team  Curt Jews, MD 03/03/2020 2:26 PM

## 2020-03-03 NOTE — Progress Notes (Signed)
Inpatient Rehabilitation Admissions Coordinator  I met with patient at bedside to discus her rehab venue options. She states preference for SNF closer to home to be close to her handicapped child. I have alerted MD, acute team and TOC. We will sign off at this time.  Danne Baxter, RN, MSN Rehab Admissions Coordinator [phone] 03/03/2020 11:15 AM

## 2020-03-03 NOTE — TOC Initial Note (Signed)
Transition of Care [facility]) - Initial/Assessment Note    Patient Details  Name: Jessica Dunlap MRN: [id number] Date of Birth: 1955/09/02  Transition of Care [facility]) CM/SW Contact:    Alexander Mt, LCSW Phone Number:  03/03/2020, 3:16 PM  Clinical Narrative:                 CSW spoke with pt at bedside. Introduced self, role, reason for visit. Pt from home with her son who she cares for. Pt sister is her primary contact and I confirmed her number. CSW confirmed home address and PCP. Pt usually is independent and interested in getting stronger before returning home. As pt has only been vaccinated with her first dose she would have to quarantine at all SNFs and states understanding that means no in person visits. She prefers being in the [place] area- she is aware that placement is dependent on her insurance plan and prior auth. Pt began getting lethargic and CSW inquired if I could call her sister for further support. Pt gave verbal approval.   CSW called and spoke with pt sister Colletta after getting verbal update from [facility] that they had run pt benefits and pt would be responsible for 20% of her bill at [facility] under her current [place] plan. CSW called [phone], introduced self, role, reason for call. I explained pt preferences and our conversation, pt sister in agreement with SNF placement. I explained the call I had gotten from SNF and that pt would be responsible for that portion of the SNF placement cost. Pt sister will speak with pt and family and requests we call back with offers tomorrow and for pt choice of disposition.   TOC team continues to follow, PASRR pending processing.   Expected Discharge Plan: Skilled Nursing Facility Barriers to Discharge: Continued Medical Work up, Ship broker   Patient Goals and CMS Choice   CMS Medicare.gov Compare Post Acute Care list provided to:: Patient Choice offered to / list presented to : Patient  Expected Discharge Plan and  Services Expected Discharge Plan: [place] In-house Referral: Clinical Social Work Discharge Planning Services: CM Consult Post Acute Care Choice: Durable Medical Equipment, [place] Living arrangements for the past 2 months: Single Family Home   Prior Living Arrangements/Services Living arrangements for the past 2 months: Single Family Home Lives with:: Adult Children Patient language and need for interpreter reviewed:: Yes (no needs) Do you feel safe going back to the place where you live?: Yes      Need for Family Participation in Patient Care: Yes (Comment) Care giver support system in place?: Yes (comment) Current home services: DME Criminal Activity/Legal Involvement Pertinent to Current Situation/Hospitalization: No - Comment as needed  Permission Sought/Granted Permission sought to share information with : Family Supports, Chartered certified accountant granted to share information with : Yes, Verbal Permission Granted   Emotional Assessment Appearance:: Appears stated age Attitude/Demeanor/Rapport: Engaged Affect (typically observed): Accepting, Adaptable, Appropriate Orientation: : Oriented to Place, Oriented to Self, Oriented to  Time, Oriented to Situation Alcohol / Substance Use: Not Applicable Psych Involvement: Outpatient Provider  Admission diagnosis:  DKA (diabetic ketoacidoses) ([place]) [E11.10] Lower extremity ulceration ([place]) [L97.909] Cellulitis of left lower extremity [L03.116] Acute respiratory failure with hypoxia ([place]) [J96.01] DKA, type 2 ([place]) [E11.10] Diabetic ketoacidosis without coma associated with type 2 diabetes mellitus ([place]) [E11.10] Diabetic ulcer of foot associated with diabetes mellitus due to underlying condition, limited to breakdown of skin ([place]) [W29.562, L97.501] Ulcer of  left lower extremity, unspecified ulcer stage [facility]) [L97.929] Patient Active Problem List   Diagnosis Date Noted   DKA  (diabetic ketoacidoses) ([place]) 02/27/2020   Diabetic ulcer of foot associated with diabetes mellitus due to underlying condition, limited to breakdown of skin ([place]) 02/27/2020   Diabetic infection of left foot ([place])    Charcot's joint of left foot    DKA, type 2 ([place]) 02/26/2020   Diabetic foot ulcer ([place]) 02/26/2020   Uncontrolled type 2 diabetes with neuropathy (HCC)    Hypothyroidism    Hypertension    Coronary artery disease    Cancer ([place])    Depression    Cirrhosis, non-alcoholic ([place])    Neuropathy    PCP:  Redmond School, MD Pharmacy:   [place], [place] [address] Phone: [phone] Fax: [phone]  [facility] - [place], [place] [address] [place] [address] Phone: [phone] Fax: [phone]  Readmission Risk Interventions Readmission Risk Prevention Plan 03/03/2020  Post Dischage Appt Not Complete  Appt Comments pt preference is for SNF  Medication Screening Complete  Transportation Screening Complete  Some recent data might be hidden

## 2020-03-03 NOTE — NC FL2 (Signed)
[place] LEVEL OF CARE SCREENING TOOL     IDENTIFICATION  Patient Name: Jessica Dunlap Birthdate: [birth year] Sex: female Admission Date (Current Location): 02/26/2020  [facility] and [place] Number:  [employer] and Address:  [facility] [place], [place] [address] [place], [place] [address]      Provider Number: [id number]  Attending Physician Name and Address:  Bonnell Public, MD  Relative Name and Phone Number:       Current Level of Care: Hospital Recommended Level of Care: [place] Prior Approval Number:    Date Approved/Denied:   PASRR Number: pending  Discharge Plan: SNF    Current Diagnoses: Patient Active Problem List   Diagnosis Date Noted  . DKA (diabetic ketoacidoses) ([place]) 02/27/2020  . Diabetic ulcer of foot associated with diabetes mellitus due to underlying condition, limited to breakdown of skin ([place]) 02/27/2020  . Diabetic infection of left foot ([place])   . Charcot's joint of left foot   . DKA, type 2 ([place]) 02/26/2020  . Diabetic foot ulcer ([place]) 02/26/2020  . Uncontrolled type 2 diabetes with neuropathy ([place])   . Hypothyroidism   . Hypertension   . Coronary artery disease   . Cancer ([place])   . Depression   . Cirrhosis, non-alcoholic ([place])   . Neuropathy     Orientation RESPIRATION BLADDER Height & Weight     Self, Time, Situation, Place  Normal Continent Weight: 131 lb (59.4 kg) Height:  5\' 3"  (160 cm)  BEHAVIORAL SYMPTOMS/MOOD NEUROLOGICAL BOWEL NUTRITION STATUS      Continent Diet (see discharge summary)  AMBULATORY STATUS COMMUNICATION OF NEEDS Skin   Extensive Assist Verbally Surgical wounds (L BKA closed incision w/ gauze and ace wrap dressing)                       Personal Care Assistance Level of Assistance  Bathing, Feeding, Dressing Bathing Assistance: Maximum assistance Feeding assistance: Independent Dressing Assistance: Maximum assistance     Functional Limitations  Info  Sight, Hearing, Speech Sight Info: Adequate Hearing Info: Adequate Speech Info: Adequate    SPECIAL CARE FACTORS FREQUENCY  PT (By licensed PT), OT (By licensed OT)     PT Frequency: 5x week OT Frequency: 5x week            Contractures Contractures Info: Not present    Additional Factors Info  Code Status, Allergies, Psychotropic, Insulin Sliding Scale Code Status Info: Full Code Allergies Info: No Known Allergies Psychotropic Info: DULoxetine (CYMBALTA) DR capsule 30 mg daily at bedtime; DULoxetine (CYMBALTA) DR capsule 60 mg daily at bedtime; traZODone (DESYREL) tablet 150 mg daily at bedtime Insulin Sliding Scale Info: insulin glargine (LANTUS) injection 15 Units daily at bedtime; insulin aspart (novoLOG) injection 0-15 Units 3x daily with meals; insulin aspart (novoLOG) injection 0-5 Units daily at bedtime; insulin aspart (novoLOG) injection 4 Units 3x daily with meals       Current Medications (03/03/2020):  This is the current hospital active medication list Current Facility-Administered Medications  Medication Dose Route Frequency Provider Last Rate Last Admin  . acetaminophen (TYLENOL) tablet 325-650 mg  325-650 mg Oral Q4H PRN Ulyses Amor, PA-C   650 mg at 03/01/20 1643   Or  . acetaminophen (TYLENOL) suppository 325-650 mg  325-650 mg Rectal Q4H PRN Ulyses Amor, PA-C      . alum & mag hydroxide-simeth (MAALOX/MYLANTA) 200-200-20 MG/5ML suspension 15-30 mL  15-30 mL Oral  Q2H PRN Laurence Slate M, PA-C      . aspirin EC tablet 81 mg  81 mg Oral Daily Laurence Slate M, PA-C   81 mg at 03/03/20 0831  . bisacodyl (DULCOLAX) EC tablet 5 mg  5 mg Oral Daily PRN Ulyses Amor, PA-C   5 mg at 03/03/20 0830  . carbamazepine (TEGRETOL) chewable tablet 100 mg  100 mg Oral BID Laurence Slate M, PA-C   100 mg at 03/03/20 0830  . dextrose 50 % solution 0-50 mL  0-50 mL Intravenous PRN Laurence Slate M, PA-C      . diltiazem (CARDIZEM CD) 24 hr capsule 120 mg  120 mg Oral  Daily Laurence Slate M, PA-C   120 mg at 03/03/20 0830  . docusate sodium (COLACE) capsule 100 mg  100 mg Oral Daily Laurence Slate M, PA-C   100 mg at 03/03/20 0830  . DULoxetine (CYMBALTA) DR capsule 30 mg  30 mg Oral QHS Laurence Slate M, PA-C   30 mg at 03/02/20 2130  . DULoxetine (CYMBALTA) DR capsule 60 mg  60 mg Oral QHS Laurence Slate M, PA-C   60 mg at 03/02/20 2130  . enoxaparin (LOVENOX) injection 40 mg  40 mg Subcutaneous Q24H Laurence Slate M, PA-C   40 mg at 03/03/20 0086  . guaiFENesin-dextromethorphan (ROBITUSSIN DM) 100-10 MG/5ML syrup 15 mL  15 mL Oral Q4H PRN Laurence Slate M, PA-C      . hydrALAZINE (APRESOLINE) injection 5 mg  5 mg Intravenous Q20 Min PRN Laurence Slate M, PA-C      . HYDROmorphone (DILAUDID) injection 0.5-1 mg  0.5-1 mg Intravenous Q2H PRN Ulyses Amor, PA-C   1 mg at 03/02/20 0426  . HYDROmorphone (DILAUDID) tablet 16 mg  16 mg Oral BID Ulyses Amor, PA-C   16 mg at 03/02/20 2035  . HYDROmorphone (DILAUDID) tablet 24 mg  24 mg Oral Daily Laurence Slate M, PA-C   24 mg at 03/03/20 7619  . insulin aspart (novoLOG) injection 0-15 Units  0-15 Units Subcutaneous TID WC Ulyses Amor, PA-C   5 Units at 03/03/20 0831  . insulin aspart (novoLOG) injection 0-5 Units  0-5 Units Subcutaneous QHS Ulyses Amor, PA-C   2 Units at 03/02/20 2100  . insulin aspart (novoLOG) injection 4 Units  4 Units Subcutaneous TID WC Donnamae Jude, MD   4 Units at 03/03/20 0830  . insulin glargine (LANTUS) injection 15 Units  15 Units Subcutaneous QHS Donnamae Jude, MD   15 Units at 03/02/20 2130  . labetalol (NORMODYNE) injection 10 mg  10 mg Intravenous Q10 min PRN Laurence Slate M, PA-C      . levothyroxine (SYNTHROID) tablet 200 mcg  200 mcg Oral Q0600 Ulyses Amor, PA-C   200 mcg at 03/03/20 5093  . magnesium sulfate IVPB 2 g 50 mL  2 g Intravenous Daily PRN Laurence Slate M, PA-C      . metoprolol tartrate (LOPRESSOR) injection 2-5 mg  2-5 mg Intravenous Q2H PRN Laurence Slate M,  PA-C      . metoprolol tartrate (LOPRESSOR) tablet 50 mg  50 mg Oral BID Laurence Slate M, PA-C   50 mg at 03/03/20 0830  . morphine (MS CONTIN) 12 hr tablet 30 mg  30 mg Oral QHS Laurence Slate M, PA-C   30 mg at 03/02/20 2131  . multivitamin with minerals tablet 1 tablet  1 tablet Oral Daily Ulyses Amor, PA-C   1  tablet at 03/03/20 0830  . nutrition supplement (JUVEN) (JUVEN) powder packet 1 packet  1 packet Oral BID BM Ulyses Amor, PA-C   1 packet at 03/02/20 [phone]  . ondansetron (ZOFRAN) injection 4 mg  4 mg Intravenous Q6H PRN Laurence Slate M, PA-C      . oxyCODONE-acetaminophen (PERCOCET/ROXICET) 5-325 MG per tablet 1-2 tablet  1-2 tablet Oral Q4H PRN Ulyses Amor, PA-C   2 tablet at 03/03/20 1011  . pantoprazole (PROTONIX) EC tablet 40 mg  40 mg Oral Daily Laurence Slate M, PA-C   40 mg at 03/03/20 0830  . phenol (CHLORASEPTIC) mouth spray 1 spray  1 spray Mouth/Throat PRN Laurence Slate M, PA-C      . polyvinyl alcohol (LIQUIFILM TEARS) 1.4 % ophthalmic solution 1 drop  1 drop Both Eyes TID PRN Laurence Slate M, PA-C      . pregabalin (LYRICA) capsule 75 mg  75 mg Oral Daily Donnamae Jude, MD   75 mg at 03/03/20 0830  . protein supplement (ENSURE MAX) liquid  11 oz Oral QHS Ulyses Amor, [place]   11 oz at 03/01/20 2051  . senna-docusate (Senokot-S) tablet 1 tablet  1 tablet Oral QHS PRN Laurence Slate M, PA-C      . sodium phosphate (FLEET) 7-19 GM/118ML enema 1 enema  1 enema Rectal Once PRN Laurence Slate M, PA-C      . traZODone (DESYREL) tablet 150 mg  150 mg Oral QHS Laurence Slate M, PA-C   150 mg at 03/02/20 2129     Discharge Medications: Please see discharge summary for a list of discharge medications.  Relevant Imaging Results:  Relevant Lab Results:   Additional Information SS#[id number]; [place] 19 vaccine 8/26  Evlyn [employer], [place]

## 2020-03-03 NOTE — Plan of Care (Signed)
  Problem: Elimination: Goal: Will not experience complications related to urinary retention Outcome: Progressing   Problem: Elimination: Goal: Will not experience complications related to bowel motility Outcome: Progressing

## 2020-03-03 NOTE — Progress Notes (Signed)
Patient ID: Jessica Dunlap, female   DOB: [birth year], 64 y.o.   MRN: [id number]  PROGRESS NOTE    LEANAH KOLANDER  YHC:[id number] DOB: 1956-05-18 DOA: 02/26/2020 PCP: Redmond School, MD    Brief Narrative:  Mackie Goon Fainis a 64 year old female with past medical history significant for type 2 diabetes, uncontrolled with hyperglycemia and neuropathy, hypertension, hypothyroidism, depression, CAD, nonalcoholic cirrhosis, who was admitted on 02/26/2019 due to left foot diabetic ulcer as well as DKA. Patient was initially started on insulin drip which was transitioned further to subcutaneous Lantus and sliding scale insulin. Vascular surgery as well as general surgery was consulted due to her left diabetic foot ulcer. Patient was transferred to [facility] for further vascular surgery evaluation.  03/03/2020: Patient seen.  Patient remained stable.  Patient is awaiting disposition.  We will continue to optimize blood sugar control.  Patient seems to be in significant amount of opiates.    Assessment & Plan:   Principal Problem:   DKA, type 2 ([place]) Active Problems:   Uncontrolled type 2 diabetes with neuropathy ([place])   Hypothyroidism   Hypertension   Coronary artery disease   Cirrhosis, non-alcoholic (HCC)   Diabetic foot ulcer ([place])   DKA (diabetic ketoacidoses) ([place])   Diabetic ulcer of foot associated with diabetes mellitus due to underlying condition, limited to breakdown of skin ([place])  DKA -Hemoglobin A1c 6.7  -Transitioned off IV insulin -Lantus, NovoLog, sliding scale. Dose increased today to 15 Lantus and 4 un Novolog with meals. Continue novolog 03/03/2020: Continue to monitor blood sugar closely.  Left diabetic foot ulcer/osteomyelitis  -Failed outpatient oral antibiotic treatment, Augmentin -Wound culture from 02/18/2020 with group B strept agalactiae -Wound culture from 02/26/2020 pending -Continue Rocephin, Flagyl -MRI revealed lateral plantar base of the fifth metatarsal fluid  collection likely abscess as well as acute osteomyelitis involving plantar base of fifth metatarsal -ABI normal bilaterally -Vascular surgery completed BKA 9/1  03/03/2020: Patient is stable postop.  Pursue disposition.  Apparently, patient wants to be discharged to a facility closer to her home.  Patient is from [place], [place].  Hypothyroidism -Continue Synthroid  CAD -Continue aspirin, metoprolol  Essential hypertension -Continue metoprolol, Cardizem   Chronic pain syndrome -Continue MS Contin, dilaudid. Reviewed PDMP  Worsening pain post surgery-->will add Lyrica--this helped   DVT prophylaxis: Lovenox SQ Code Status: Full code  Family Communication: patient at bedside Disposition Plan: CIR  Remains inpatient appropriate because:Inpatient level of care appropriate due to severity of illness  Consultants:   Vascular surgery  Procedures:  Left BKA  Antimicrobials: Anti-infectives (From admission, onward)   Start     Dose/Rate Route Frequency Ordered Stop   03/01/20 2200  ceFAZolin (ANCEF) IVPB 2g/100 mL premix        2 g 200 mL/hr over 30 Minutes Intravenous Every 8 hours 03/01/20 1159 03/02/20 0512   02/26/20 2220  cefTRIAXone (ROCEPHIN) 2 g in sodium chloride 0.9 % 100 mL IVPB  Status:  Discontinued       "And" Linked Group Details   2 g 200 mL/hr over 30 Minutes Intravenous Every 24 hours 02/26/20 2220 03/01/20 1159   02/26/20 2220  metroNIDAZOLE (FLAGYL) IVPB 500 mg  Status:  Discontinued       "And" Linked Group Details   500 mg 100 mL/hr over 60 Minutes Intravenous Every 8 hours 02/26/20 2220 03/01/20 1159   02/26/20 1715  piperacillin-tazobactam (ZOSYN) IVPB 3.375 g        3.375 g 100  mL/hr over 30 Minutes Intravenous  Once 02/26/20 1701 02/26/20 1809       Subjective: No new complaints. No chest pain. No shortness of breath.    Objective: Vitals:   03/02/20 1300 03/02/20 2020 03/03/20 0454 03/03/20 1335  BP: (!) 132/58 (!) 144/70 (!)  147/70 122/66  Pulse: 87 95 88 82  Resp: 17 16 18 18   Temp: 98.6 F (37 C) 98.4 F (36.9 C) 98.1 F (36.7 C) 98.2 F (36.8 C)  TempSrc: Oral Oral Oral Oral  SpO2: 100% 97% 100% 99%  Weight:      Height:        Intake/Output Summary (Last 24 hours) at 03/03/2020 1914 Last data filed at 03/03/2020 1700 Gross per 24 hour  Intake 460 ml  Output 1050 ml  Net -590 ml   Filed Weights   02/26/20 1645  Weight: 59.4 kg    Examination:  General exam: Appears calm and comfortable  Respiratory system: Clear to auscultation. Respiratory effort normal. Cardiovascular system: S1 & S2 heard, RRR.  Gastrointestinal system: Abdomen is nondistended, soft and nontender.  Central nervous system: Alert and oriented. No focal neurological deficits. Extremities: ACE wrap on left BKA   Data Reviewed: I have personally reviewed following labs and imaging studies  CBC: Recent Labs  Lab 02/26/20 1725 02/26/20 1725 02/29/20 0148 02/29/20 0148 03/01/20 0352 03/01/20 4315 03/01/20 1222 03/02/20 0637 03/03/20 0139  WBC 16.0*   < > 4.3  --  5.3  --  4.8 6.2 5.8  NEUTROABS 14.4*  --   --   --   --   --   --   --   --   HGB 11.4*   < > 7.8*   < > 8.1* 8.2* 7.4* 7.3* 8.4*  HCT 32.8*   < > 23.5*   < > 24.3* 24.0* 22.4* 21.7* 25.2*  MCV 88.9   < > 90.7  --  89.3  --  90.0 91.6 90.6  PLT 222   < > 163  --  180  --  172 185 199   < > = values in this interval not displayed.   Basic Metabolic Panel: Recent Labs  Lab 02/28/20 0107 02/28/20 0107 02/29/20 0148 02/29/20 0148 03/01/20 0352 03/01/20 4008 03/01/20 1222 03/02/20 0637 03/03/20 0139  NA 132*   < > 136  --  136 138  --  136 135  K 3.6   < > 3.9  --  3.6 3.6  --  3.2* 3.1*  CL 100   < > 105  --  103 101  --  102 104  CO2 22  --  21*  --  22  --   --  23 25  GLUCOSE 321*   < > 224*  --  162* 176*  --  188* 216*  BUN <5*   < > <5*  --  <5* <3*  --  <5* <5*  CREATININE 0.68   < > 0.88   < > 0.71 0.50 0.69 0.76 0.68  CALCIUM 7.7*  --   8.2*  --  8.6*  --   --  8.5* 8.4*   < > = values in this interval not displayed.   GFR: Estimated Creatinine Clearance: 58.8 mL/min (by C-G formula based on SCr of 0.68 mg/dL). Liver Function Tests: Recent Labs  Lab 02/26/20 1725  AST 14*  ALT 11  ALKPHOS 61  BILITOT 1.1  PROT 8.3*  ALBUMIN 3.9   CBG: Recent  Labs  Lab 03/02/20 1708 03/02/20 2023 03/03/20 0753 03/03/20 1159 03/03/20 1652  GLUCAP 170* 217* 227* 201* 258*   Sepsis Labs: Recent Labs  Lab 02/26/20 1725  LATICACIDVEN 3.9*    Recent Results (from the past 240 hour(s))  Wound or Superficial Culture     Status: None   Collection Time: 02/26/20  5:01 PM   Specimen: Ulcer; Wound  Result Value Ref Range Status   Specimen Description   Final    ULCER Performed at [facility], [address], [place], [place] [address]    Special Requests   Final    NONE Performed at [address] [place], [place] [address]    Gram Stain   Final    FEW WBC PRESENT, PREDOMINANTLY PMN FEW GRAM NEGATIVE RODS RARE GRAM POSITIVE COCCI IN CLUSTERS    Culture   Final    RARE STREPTOCOCCUS AGALACTIAE RARE ESCHERICHIA COLI TESTING AGAINST S. AGALACTIAE NOT ROUTINELY PERFORMED DUE TO PREDICTABILITY OF AMP/PEN/VAN SUSCEPTIBILITY. Performed at [facility], [place] [address] [place], [place] [address]    Report Status 02/29/2020 FINAL  Final   Organism ID, Bacteria ESCHERICHIA COLI  Final      Susceptibility   Escherichia coli - MIC*    AMPICILLIN 16 INTERMEDIATE Intermediate     CEFAZOLIN <=4 SENSITIVE Sensitive     CEFEPIME <=0.12 SENSITIVE Sensitive     CEFTAZIDIME <=1 SENSITIVE Sensitive     CEFTRIAXONE <=0.25 SENSITIVE Sensitive     CIPROFLOXACIN 0.5 SENSITIVE Sensitive     GENTAMICIN 4 SENSITIVE Sensitive     IMIPENEM 0.5 SENSITIVE Sensitive     TRIMETH/SULFA >=320 RESISTANT Resistant     AMPICILLIN/SULBACTAM <=2 SENSITIVE Sensitive     PIP/TAZO <=4 SENSITIVE Sensitive     * RARE ESCHERICHIA  COLI  SARS Coronavirus 2 by RT PCR (hospital order, performed in [place] hospital lab) Nasopharyngeal Nasopharyngeal Swab     Status: None   Collection Time: 02/26/20  6:22 PM   Specimen: Nasopharyngeal Swab  Result Value Ref Range Status   SARS Coronavirus 2 NEGATIVE NEGATIVE Final    Comment: (NOTE) SARS-CoV-2 target nucleic acids are NOT DETECTED.  The SARS-CoV-2 RNA is generally detectable in upper and lower respiratory specimens during the acute phase of infection. The lowest concentration of SARS-CoV-2 viral copies this assay can detect is 250 copies / mL. A negative result does not preclude SARS-CoV-2 infection and should not be used as the sole basis for treatment or other patient management decisions.  A negative result may occur with improper specimen collection / handling, submission of specimen other than nasopharyngeal swab, presence of viral mutation(s) within the areas targeted by this assay, and inadequate number of viral copies (<250 copies / mL). A negative result must be combined with clinical observations, patient history, and epidemiological information.  Fact Sheet for Patients:   StrictlyIdeas.no  Fact Sheet for Healthcare Providers: BankingDealers.co.za  This test is not yet approved or  cleared by the [place] FDA and has been authorized for detection and/or diagnosis of SARS-CoV-2 by FDA under an Emergency Use Authorization (EUA).  This EUA will remain in effect (meaning this test can be used) for the duration of the COVID-19 declaration under Section 564(b)(1) of the Act, 21 U.S.C. section 360bbb-3(b)(1), unless the authorization is terminated or revoked sooner.  Performed at [facility], [address], [place], [place] [address]   Blood Cultures x 2 sites     Status: None  Collection Time: 02/26/20 11:22 PM   Specimen: BLOOD  Result Value Ref Range Status   Specimen Description BLOOD LEFT  ANTECUBITAL  Final   Special Requests   Final    BOTTLES DRAWN AEROBIC AND ANAEROBIC Blood Culture adequate volume   Culture   Final    NO GROWTH 5 DAYS Performed at [facility], [address], [place], [place] [address]    Report Status 03/02/2020 FINAL  Final  Blood Cultures x 2 sites     Status: None   Collection Time: 02/26/20 11:33 PM   Specimen: BLOOD LEFT HAND  Result Value Ref Range Status   Specimen Description BLOOD LEFT HAND  Final   Special Requests AEROBIC BOTTLE ONLY Blood Culture adequate volume  Final   Culture   Final    NO GROWTH 5 DAYS Performed at [facility], [address] [place], [place] [address]    Report Status 03/02/2020 FINAL  Final  Surgical pcr screen     Status: None   Collection Time: 03/01/20  1:11 AM   Specimen: Nasal Mucosa; Nasal Swab  Result Value Ref Range Status   MRSA, PCR NEGATIVE NEGATIVE Final   Staphylococcus aureus NEGATIVE NEGATIVE Final    Comment: (NOTE) The Xpert SA Assay (FDA approved for NASAL specimens in patients 7 years of age and older), is one component of a comprehensive surveillance program. It is not intended to diagnose infection nor to guide or monitor treatment. Performed at [facility], [place] [address], [place], [place] [address]       Radiology Studies: No results found.   Scheduled Meds: . aspirin EC  81 mg Oral Daily  . carbamazepine  100 mg Oral BID  . diltiazem  120 mg Oral Daily  . docusate sodium  100 mg Oral Daily  . DULoxetine  30 mg Oral QHS  . DULoxetine  60 mg Oral QHS  . enoxaparin (LOVENOX) injection  40 mg Subcutaneous Q24H  . HYDROmorphone  16 mg Oral BID  . HYDROmorphone  24 mg Oral Daily  . insulin aspart  0-15 Units Subcutaneous TID WC  . insulin aspart  0-5 Units Subcutaneous QHS  . insulin aspart  4 Units Subcutaneous TID WC  . insulin glargine  15 Units Subcutaneous QHS  . levothyroxine  200 mcg Oral Q0600  . metoprolol tartrate  50 mg Oral BID  . morphine  30 mg Oral  QHS  . multivitamin with minerals  1 tablet Oral Daily  . nutrition supplement (JUVEN)  1 packet Oral BID BM  . pantoprazole  40 mg Oral Daily  . polyethylene glycol  17 g Oral Daily  . potassium chloride  40 mEq Oral Q4H  . pregabalin  75 mg Oral Daily  . Ensure Max Protein  11 oz Oral QHS  . traZODone  150 mg Oral QHS   Continuous Infusions: . magnesium sulfate bolus IVPB       LOS: 6 days    Bonnell Public, MD 03/03/2020 7:14 PM  [facility] If 7PM-7AM, please contact night-coverage 03/03/2020, 7:14 PM

## 2020-03-04 LAB — RENAL FUNCTION PANEL
Albumin: 2.5 g/dL — ABNORMAL LOW (ref 3.5–5.0)
Anion gap: 5 (ref 5–15)
BUN: <5 mg/dL — ABNORMAL LOW (ref 8–23)
CO2: 25 mmol/L (ref 22–32)
Calcium: 8.3 mg/dL — ABNORMAL LOW (ref 8.9–10.3)
Chloride: 106 mmol/L (ref 98–111)
Creatinine, Ser: 0.55 mg/dL (ref 0.44–1.00)
GFR calc Af Amer: >60 mL/min (ref >60)
GFR calc non Af Amer: >60 mL/min (ref >60)
Glucose, Bld: 212 mg/dL — ABNORMAL HIGH (ref 70–99)
Phosphorus: 3.3 mg/dL (ref 2.5–4.6)
Potassium: 4.3 mmol/L (ref 3.5–5.1)
Sodium: 136 mmol/L (ref 135–145)

## 2020-03-04 LAB — GLUCOSE, CAPILLARY
Glucose-Capillary: 181 mg/dL — ABNORMAL HIGH (ref 70–99)
Glucose-Capillary: 250 mg/dL — ABNORMAL HIGH (ref 70–99)
Glucose-Capillary: 229 mg/dL — ABNORMAL HIGH (ref 70–99)
Glucose-Capillary: 231 mg/dL — ABNORMAL HIGH (ref 70–99)

## 2020-03-04 LAB — MAGNESIUM: Magnesium: 1.7 mg/dL (ref 1.7–2.4)

## 2020-03-04 MED ORDER — INSULIN GLARGINE 100 UNIT/ML ~~LOC~~ SOLN
20.0000 [IU] | Freq: Every day | SUBCUTANEOUS | Status: DC
  Administered 2020-03-04 – 2020-03-06 (×3): 20 [IU] via SUBCUTANEOUS
  Filled 2020-03-04 (×4): qty 0.2

## 2020-03-04 NOTE — TOC Progression Note (Signed)
Transition of Care [facility]) - Progression Note    Patient Details  Name: Jessica Dunlap MRN: [id number] Date of Birth: 1955/12/25  Transition of Care [facility]) CM/SW [place], [place] Phone Number: [phone] 03/04/2020, 12:11 PM  Clinical Narrative:     CSW met with patient to discuss the bed offers. Patient chose [facility] ([facility]) however CSW was unable to confirm bed due to not reaching admission staff.   TOC team will continue to assist with discharge planning needs.  Expected Discharge Plan: [place] Barriers to Discharge: Continued Medical Work up, Ship broker  Expected Discharge Plan and Services Expected Discharge Plan: [place] In-house Referral: Clinical Social Work Discharge Planning Services: CM Consult Post Acute Care Choice: Museum/gallery conservator, [place] Living arrangements for the past 2 months: Single Family Home                                       Social Determinants of Health (SDOH) Interventions    Readmission Risk Interventions Readmission Risk Prevention Plan 03/03/2020  Post Dischage Appt Not Complete  Appt Comments pt preference is for SNF  Medication Screening Complete  Transportation Screening Complete  Some recent data might be hidden

## 2020-03-04 NOTE — Progress Notes (Signed)
Patient ID: Jessica Dunlap, female   DOB: 1955-11-29, 64 y.o.   MRN: [id number]  PROGRESS NOTE    Jessica Dunlap  AYT:[id number] DOB: 24-Feb-1956 DOA: 02/26/2020 PCP: Redmond School, MD    Brief Narrative:  Jessica Dunlap a 64 year old female with past medical history significant for type 2 diabetes, uncontrolled with hyperglycemia and neuropathy, hypertension, hypothyroidism, depression, CAD, nonalcoholic cirrhosis, who was admitted on 02/26/2019 due to left foot diabetic ulcer as well as DKA. Patient was initially started on insulin drip which was transitioned further to subcutaneous Lantus and sliding scale insulin. Vascular surgery as well as general surgery was consulted due to her left diabetic foot ulcer. Patient was transferred to [facility] for further vascular surgery evaluation.  03/03/2020: Patient seen.  Patient remained stable.  Patient is awaiting disposition.  We will continue to optimize blood sugar control.  Patient seems to be in significant amount of opiates.    03/04/2020: No changes.  Will increase subcutaneous Lantus to 20 units nightly.  Continue other management.  Pursue disposition.  Assessment & Plan:   Principal Problem:   DKA, type 2 ([place]) Active Problems:   Uncontrolled type 2 diabetes with neuropathy ([place])   Hypothyroidism   Hypertension   Coronary artery disease   Cirrhosis, non-alcoholic ([facility])   Diabetic foot ulcer ([place])   DKA (diabetic ketoacidoses) ([place])   Diabetic ulcer of foot associated with diabetes mellitus due to underlying condition, limited to breakdown of skin ([place])  DKA -Hemoglobin A1c 6.7  -Transitioned off IV insulin -Lantus, NovoLog, sliding scale. Dose increased today to 15 Lantus and 4 un Novolog with meals. Continue novolog 03/03/2020: DKA has resolved.  Continue to monitor blood sugar closely. 03/04/2020: Increase subcutaneous Lantus from 15 units nightly to 20 units nightly.  Continue sliding scale insulin coverage.  Further management  will depend on hospital course.  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  Pursue disposition.  Apparently, patient wants to be discharged to a facility closer to her home.  Patient is from [place], [place].  Hypothyroidism -Continue Synthroid  CAD -Continue aspirin, metoprolol  Essential hypertension -Continue metoprolol, Cardizem   Chronic pain syndrome -Continue MS Contin, dilaudid. Reviewed PDMP  Worsening pain post surgery-->will add Lyrica--this helped   DVT prophylaxis: Lovenox SQ Code Status: Full code  Family Communication: patient at bedside Disposition Plan: CIR  Remains inpatient appropriate because:Inpatient level of care appropriate due to severity of illness  Consultants:   Vascular surgery  Procedures:  Left BKA  Antimicrobials: Anti-infectives (From admission, onward)   Start     Dose/Rate Route Frequency Ordered Stop   03/01/20 2200  ceFAZolin (ANCEF) IVPB 2g/100 mL premix        2 g 200 mL/hr over 30 Minutes Intravenous Every 8 hours 03/01/20 1159 03/02/20 0512   02/26/20 2220  cefTRIAXone (ROCEPHIN) 2 g in sodium chloride 0.9 % 100 mL IVPB  Status:  Discontinued       "And" Linked Group Details   2 g 200 mL/hr over 30 Minutes Intravenous Every 24 hours 02/26/20 2220 03/01/20 1159   02/26/20 2220  metroNIDAZOLE (FLAGYL) IVPB 500 mg  Status:  Discontinued       "And" Linked Group Details   500 mg 100 mL/hr over 60 Minutes Intravenous Every 8 hours 02/26/20 2220 03/01/20 1159   02/26/20 1715   piperacillin-tazobactam (ZOSYN) IVPB 3.375 g        3.375 g 100 mL/hr over 30 Minutes Intravenous  Once 02/26/20 1701 02/26/20 1809       Subjective: No new complaints. No chest pain. No shortness of breath.    Objective: Vitals:   03/03/20 1335 03/03/20 2108 03/04/20 0602 03/04/20 1435  BP: 122/66 (!) 164/75 (!) 151/67 (!) 143/66  Pulse: 82 96 78 87  Resp: 18 18 18 18   Temp: 98.2 F (36.8 C) 98.5 F (36.9 C)  97.7 F (36.5 C)  TempSrc: Oral Oral Oral Oral  SpO2: 99% 100% 100% 99%  Weight:      Height:        Intake/Output Summary (Last 24 hours) at 03/04/2020 1834 Last data filed at 03/04/2020 1435 Gross per 24 hour  Intake 810 ml  Output --  Net 810 ml   Filed Weights   02/26/20 1645  Weight: 59.4 kg    Examination:  General exam: Appears calm and comfortable  Respiratory system: Clear to auscultation. Respiratory effort normal. Cardiovascular system: S1 & S2 heard, RRR.  Gastrointestinal system: Abdomen is nondistended, soft and nontender.  Central nervous system: Alert and oriented. No focal neurological deficits. Extremities: ACE wrap on left BKA   Data Reviewed: I have personally reviewed following labs and imaging studies  CBC: Recent Labs  Lab 02/29/20 0148 02/29/20 0148 03/01/20 0352 03/01/20 0924 03/01/20 1222 03/02/20 0637 03/03/20 0139  WBC 4.3  --  5.3  --  4.8 6.2 5.8  NEUTROABS  --   --   --   --   --   --  3.9  HGB 7.8*   < > 8.1* 8.2* 7.4* 7.3* 8.4*  HCT 23.5*   < > 24.3* 24.0* 22.4* 21.7* 25.2*  MCV 90.7  --  89.3  --  90.0 91.6 90.6  PLT 163  --  180  --  172 185 199   < > = values in this interval not displayed.   Basic Metabolic Panel: Recent Labs  Lab 02/29/20 0148 02/29/20 0148 03/01/20 0352 03/01/20 0352 03/01/20 3474 03/01/20 1222 03/02/20 0637 03/03/20 0139 03/04/20 0300  NA 136   < > 136  --  138  --  136 135 136  K 3.9   < > 3.6  --  3.6  --  3.2* 3.1* 4.3  CL 105   < > 103  --  101  --  102 104 106  CO2 21*   --  22  --   --   --  23 25 25   GLUCOSE 224*   < > 162*  --  176*  --  188* 216* 212*  BUN <5*   < > <5*  --  <3*  --  <5* <5* <5*  CREATININE 0.88   < > 0.71   < > 0.50 0.69 0.76 0.68 0.55  CALCIUM 8.2*  --  8.6*  --   --   --  8.5* 8.4* 8.3*  MG  --   --   --   --   --   --   --   --  1.7  PHOS  --   --   --   --   --   --   --   --  3.3   < > = values in this interval not displayed.   GFR: Estimated Creatinine Clearance: 58.8 mL/min (by C-G formula based on SCr of 0.55 mg/dL). Liver Function Tests: Recent Labs  Lab 03/04/20 0300  ALBUMIN 2.5*   CBG: Recent Labs  Lab 03/03/20 1652 03/03/20 2100 03/04/20 0800 03/04/20 1117 03/04/20 1646  GLUCAP 258* 229* 181* 250* 229*   Sepsis Labs: No results for input(s): PROCALCITON, LATICACIDVEN in the last 168 hours.  Recent Results (from the past 240 hour(s))  Wound or Superficial Culture     Status: None   Collection Time: 02/26/20  5:01 PM   Specimen: Ulcer; Wound  Result Value Ref Range Status   Specimen Description   Final    ULCER Performed at [facility], [address], [place], [place] [address]    Special Requests   Final    NONE Performed at [address] [place], [place] [address]    Gram Stain   Final    FEW WBC PRESENT, PREDOMINANTLY PMN FEW GRAM NEGATIVE RODS RARE GRAM POSITIVE COCCI IN CLUSTERS    Culture   Final    RARE STREPTOCOCCUS AGALACTIAE RARE ESCHERICHIA COLI TESTING AGAINST S. AGALACTIAE NOT ROUTINELY PERFORMED DUE TO PREDICTABILITY OF AMP/PEN/VAN SUSCEPTIBILITY. Performed at [facility], [place] [address] [place], [place] [address]    Report Status 02/29/2020 FINAL  Final   Organism ID, Bacteria ESCHERICHIA COLI  Final      Susceptibility   Escherichia coli - MIC*    AMPICILLIN 16 INTERMEDIATE Intermediate     CEFAZOLIN <=4 SENSITIVE Sensitive     CEFEPIME <=0.12 SENSITIVE Sensitive     CEFTAZIDIME <=1 SENSITIVE Sensitive     CEFTRIAXONE <=0.25 SENSITIVE Sensitive      CIPROFLOXACIN 0.5 SENSITIVE Sensitive     GENTAMICIN 4 SENSITIVE Sensitive     IMIPENEM 0.5 SENSITIVE Sensitive     TRIMETH/SULFA >=320 RESISTANT Resistant     AMPICILLIN/SULBACTAM <=2 SENSITIVE Sensitive     PIP/TAZO <=4 SENSITIVE Sensitive     * RARE ESCHERICHIA COLI  SARS Coronavirus 2 by RT PCR (hospital order, performed in [facility] hospital lab) Nasopharyngeal Nasopharyngeal Swab     Status: None   Collection Time: 02/26/20  6:22 PM   Specimen: Nasopharyngeal Swab  Result Value Ref Range Status   SARS Coronavirus 2 NEGATIVE NEGATIVE Final    Comment: (NOTE) SARS-CoV-2 target nucleic acids are NOT DETECTED.  The SARS-CoV-2 RNA is generally detectable in upper and lower respiratory specimens during the acute phase of infection. The lowest concentration of SARS-CoV-2 viral copies this assay can detect is 250 copies / mL. A negative result does not preclude SARS-CoV-2 infection and should not be used as the sole basis for treatment or other patient management decisions.  A negative result may occur with improper specimen collection / handling, submission of specimen other than nasopharyngeal swab, presence of viral mutation(s) within the areas targeted by this assay, and inadequate number of viral copies (<250 copies / mL). A negative result must be combined with clinical observations, patient history, and epidemiological information.  Fact Sheet for Patients:   StrictlyIdeas.no  Fact Sheet for Healthcare Providers: BankingDealers.co.za  This test is not yet approved or  cleared by the [place] FDA and has been authorized for detection and/or diagnosis of SARS-CoV-2 by FDA under an Emergency Use Authorization (EUA).  This EUA will remain in effect (meaning this test can be used) for the duration of the COVID-19  declaration under Section 564(b)(1) of the Act, 21 U.S.C. section 360bbb-3(b)(1), unless the authorization is  terminated or revoked sooner.  Performed at [facility], [address] [place], [place] [address]   Blood Cultures x 2 sites     Status: None   Collection Time: 02/26/20 11:22 PM   Specimen: BLOOD  Result Value Ref Range Status   Specimen Description BLOOD LEFT ANTECUBITAL  Final   Special Requests   Final    BOTTLES DRAWN AEROBIC AND ANAEROBIC Blood Culture adequate volume   Culture   Final    NO GROWTH 5 DAYS Performed at [facility], [address] [place], [place] [address]    Report Status 03/02/2020 FINAL  Final  Blood Cultures x 2 sites     Status: None   Collection Time: 02/26/20 11:33 PM   Specimen: BLOOD LEFT HAND  Result Value Ref Range Status   Specimen Description BLOOD LEFT HAND  Final   Special Requests AEROBIC BOTTLE ONLY Blood Culture adequate volume  Final   Culture   Final    NO GROWTH 5 DAYS Performed at [facility], [address], [place], [place] [address]    Report Status 03/02/2020 FINAL  Final  Surgical pcr screen     Status: None   Collection Time: 03/01/20  1:11 AM   Specimen: Nasal Mucosa; Nasal Swab  Result Value Ref Range Status   MRSA, PCR NEGATIVE NEGATIVE Final   Staphylococcus aureus NEGATIVE NEGATIVE Final    Comment: (NOTE) The Xpert SA Assay (FDA approved for NASAL specimens in patients 72 years of age and older), is one component of a comprehensive surveillance program. It is not intended to diagnose infection nor to guide or monitor treatment. Performed at [facility], [place] [address] [place], [place] [address]       Radiology Studies: No results found.   Scheduled Meds:  aspirin EC  81 mg Oral Daily   carbamazepine  100 mg Oral BID   diltiazem  120 mg Oral Daily   docusate sodium  100 mg Oral Daily   DULoxetine  30 mg Oral QHS   DULoxetine  60 mg Oral QHS   enoxaparin (LOVENOX) injection  40 mg Subcutaneous Q24H   HYDROmorphone  16 mg Oral BID   HYDROmorphone  24 mg Oral Daily   insulin aspart   0-15 Units Subcutaneous TID WC   insulin aspart  0-5 Units Subcutaneous QHS   insulin aspart  4 Units Subcutaneous TID WC   insulin glargine  20 Units Subcutaneous Q2200   levothyroxine  200 mcg Oral Q0600   metoprolol tartrate  50 mg Oral BID   morphine  30 mg Oral QHS   multivitamin with minerals  1 tablet Oral Daily   nutrition supplement (JUVEN)  1 packet Oral BID BM   pantoprazole  40 mg Oral Daily   polyethylene glycol  17 g Oral Daily   pregabalin  75 mg Oral Daily   Ensure Max Protein  11 oz Oral QHS   traZODone  150 mg Oral QHS   Continuous Infusions:  magnesium sulfate bolus IVPB       LOS: 7 days    Bonnell Public, MD 03/04/2020 6:34 PM  [facility] If 7PM-7AM, please contact night-coverage 03/04/2020, 6:34 PM

## 2020-03-05 DIAGNOSIS — L03116 Cellulitis of left lower limb: Secondary | ICD-10-CM

## 2020-03-05 LAB — TYPE AND SCREEN
ABO/RH(D): O POS
Antibody Screen: NEGATIVE
Unit division: 0
Unit division: 0

## 2020-03-05 LAB — BPAM RBC
Blood Product Expiration Date: 202110042359
Blood Product Expiration Date: 202110042359
ISSUE DATE / TIME: 202109021017
Unit Type and Rh: 5100
Unit Type and Rh: 5100

## 2020-03-05 LAB — GLUCOSE, CAPILLARY
Glucose-Capillary: 293 mg/dL — ABNORMAL HIGH (ref 70–99)
Glucose-Capillary: 213 mg/dL — ABNORMAL HIGH (ref 70–99)
Glucose-Capillary: 223 mg/dL — ABNORMAL HIGH (ref 70–99)
Glucose-Capillary: 161 mg/dL — ABNORMAL HIGH (ref 70–99)

## 2020-03-05 NOTE — Progress Notes (Signed)
Patient ID: Jessica Dunlap, female   DOB: [birth year], 64 y.o.   MRN: [id number]  PROGRESS NOTE    Jessica Dunlap  ZES:[id number] DOB: 15-Feb-1956 DOA: 02/26/2020 PCP: Redmond School, MD    Brief Narrative:  Jessica Dunlap a 64 year old female with past medical history significant for type 2 diabetes, uncontrolled with hyperglycemia and neuropathy, hypertension, hypothyroidism, depression, CAD, nonalcoholic cirrhosis, who was admitted on 02/26/2019 due to left foot diabetic ulcer as well as DKA. Patient was initially started on insulin drip which was transitioned further to subcutaneous Lantus and sliding scale insulin. Vascular surgery as well as general surgery was consulted due to her left diabetic foot ulcer. Patient was transferred to [facility] for further vascular surgery evaluation.  03/03/2020: Patient seen.  Patient remained stable.  Patient is awaiting disposition.  We will continue to optimize blood sugar control.  Patient seems to be in significant amount of opiates.    03/05/2020: No changes.  Continue subcutaneous Lantus to 20 units nightly.  Continue to monitor blood sugar closely.  Pursue disposition.  Assessment & Plan:   Principal Problem:   DKA, type 2 ([place]) Active Problems:   Uncontrolled type 2 diabetes with neuropathy ([place])   Hypothyroidism   Hypertension   Coronary artery disease   Cirrhosis, non-alcoholic (HCC)   Diabetic foot ulcer ([place])   DKA (diabetic ketoacidoses) ([place])   Diabetic ulcer of foot associated with diabetes mellitus due to underlying condition, limited to breakdown of skin ([place])   Cellulitis of left lower extremity  DKA -Hemoglobin A1c 6.7  -Transitioned off IV insulin -Lantus, NovoLog, sliding scale. Dose increased today to 15 Lantus and 4 un Novolog with meals. Continue novolog 03/03/2020: DKA has resolved.  Continue to monitor blood sugar closely. 03/05/2020: Continue subcutaneous Lantus at 20 units nightly.  Continue sliding scale insulin  coverage.  Further management will depend on hospital course.  Left diabetic foot ulcer/osteomyelitis  -Failed outpatient oral antibiotic treatment, Augmentin -Wound culture from 02/18/2020 with group B strept agalactiae -Wound culture from 02/26/2020 pending -Continue Rocephin, Flagyl -MRI revealed lateral plantar base of the fifth metatarsal fluid collection likely abscess as well as acute osteomyelitis involving plantar base of fifth metatarsal -ABI normal bilaterally -Vascular surgery completed BKA 9/1  03/05/2020: Patient is stable postop.  Pursue disposition.  Patient wants to be discharged to a facility closer to her home.  Patient is from [place], [place].  Hypothyroidism -Continue Synthroid  CAD -Continue aspirin, metoprolol  Essential hypertension -Continue metoprolol, Cardizem   Chronic pain syndrome -Continue MS Contin, dilaudid. Reviewed PDMP  Worsening pain post surgery-->will add Lyrica--this helped   DVT prophylaxis: Lovenox SQ Code Status: Full code  Family Communication: patient at bedside Disposition Plan: CIR/SNF  Remains inpatient appropriate because:Inpatient level of care appropriate due to severity of illness  Consultants:   Vascular surgery  Procedures:  Left BKA  Antimicrobials: Anti-infectives (From admission, onward)   Start     Dose/Rate Route Frequency Ordered Stop   03/01/20 2200  ceFAZolin (ANCEF) IVPB 2g/100 mL premix        2 g 200 mL/hr over 30 Minutes Intravenous Every 8 hours 03/01/20 1159 03/02/20 0512   02/26/20 2220  cefTRIAXone (ROCEPHIN) 2 g in sodium chloride 0.9 % 100 mL IVPB  Status:  Discontinued       "And" Linked Group Details   2 g 200 mL/hr over 30 Minutes Intravenous Every 24 hours 02/26/20 2220 03/01/20 1159   02/26/20 2220  metroNIDAZOLE (FLAGYL)  IVPB 500 mg  Status:  Discontinued       "And" Linked Group Details   500 mg 100 mL/hr over 60 Minutes Intravenous Every 8 hours 02/26/20 2220 03/01/20 1159    02/26/20 1715  piperacillin-tazobactam (ZOSYN) IVPB 3.375 g        3.375 g 100 mL/hr over 30 Minutes Intravenous  Once 02/26/20 1701 02/26/20 1809       Subjective: No new complaints. No chest pain. No shortness of breath.    Objective: Vitals:   03/04/20 1435 03/04/20 2037 03/05/20 0450 03/05/20 1526  BP: (!) 143/66 (!) 152/66 138/62 (!) 143/68  Pulse: 87 89 78 77  Resp: 18 16 16 16   Temp: 97.7 F (36.5 C) 98.2 F (36.8 C) 98.2 F (36.8 C) 98.3 F (36.8 C)  TempSrc: Oral Oral Oral Oral  SpO2: 99% 100% 99% 98%  Weight:      Height:        Intake/Output Summary (Last 24 hours) at 03/05/2020 1935 Last data filed at 03/05/2020 1330 Gross per 24 hour  Intake 497 ml  Output 1050 ml  Net -553 ml   Filed Weights   02/26/20 1645  Weight: 59.4 kg    Examination:  General exam: Appears calm and comfortable  Respiratory system: Clear to auscultation. Respiratory effort normal. Cardiovascular system: S1 & S2 heard, RRR.  Gastrointestinal system: Abdomen is nondistended, soft and nontender.  Central nervous system: Alert and oriented. No focal neurological deficits. Extremities: ACE wrap on left BKA   Data Reviewed: I have personally reviewed following labs and imaging studies  CBC: Recent Labs  Lab 02/29/20 0148 02/29/20 0148 03/01/20 0352 03/01/20 0924 03/01/20 1222 03/02/20 0637 03/03/20 0139  WBC 4.3  --  5.3  --  4.8 6.2 5.8  NEUTROABS  --   --   --   --   --   --  3.9  HGB 7.8*   < > 8.1* 8.2* 7.4* 7.3* 8.4*  HCT 23.5*   < > 24.3* 24.0* 22.4* 21.7* 25.2*  MCV 90.7  --  89.3  --  90.0 91.6 90.6  PLT 163  --  180  --  172 185 199   < > = values in this interval not displayed.   Basic Metabolic Panel: Recent Labs  Lab 02/29/20 0148 02/29/20 0148 03/01/20 0352 03/01/20 0352 03/01/20 3244 03/01/20 1222 03/02/20 0637 03/03/20 0139 03/04/20 0300  NA 136   < > 136  --  138  --  136 135 136  K 3.9   < > 3.6  --  3.6  --  3.2* 3.1* 4.3  CL 105   < > 103   --  101  --  102 104 106  CO2 21*  --  22  --   --   --  23 25 25   GLUCOSE 224*   < > 162*  --  176*  --  188* 216* 212*  BUN <5*   < > <5*  --  <3*  --  <5* <5* <5*  CREATININE 0.88   < > 0.71   < > 0.50 0.69 0.76 0.68 0.55  CALCIUM 8.2*  --  8.6*  --   --   --  8.5* 8.4* 8.3*  MG  --   --   --   --   --   --   --   --  1.7  PHOS  --   --   --   --   --   --   --   --  3.3   < > = values in this interval not displayed.   GFR: Estimated Creatinine Clearance: 58.8 mL/min (by C-G formula based on SCr of 0.55 mg/dL). Liver Function Tests: Recent Labs  Lab 03/04/20 0300  ALBUMIN 2.5*   CBG: Recent Labs  Lab 03/04/20 1646 03/04/20 2040 03/05/20 0854 03/05/20 1227 03/05/20 1723  GLUCAP 229* 231* 293* 213* 223*   Sepsis Labs: No results for input(s): PROCALCITON, LATICACIDVEN in the last 168 hours.  Recent Results (from the past 240 hour(s))  Wound or Superficial Culture     Status: None   Collection Time: 02/26/20  5:01 PM   Specimen: Ulcer; Wound  Result Value Ref Range Status   Specimen Description   Final    ULCER Performed at [facility], [address], [place], [place] [address]    Special Requests   Final    NONE Performed at [place] [place], [place] [address]    Gram Stain   Final    FEW WBC PRESENT, PREDOMINANTLY PMN FEW GRAM NEGATIVE RODS RARE GRAM POSITIVE COCCI IN CLUSTERS    Culture   Final    RARE STREPTOCOCCUS AGALACTIAE RARE ESCHERICHIA COLI TESTING AGAINST S. AGALACTIAE NOT ROUTINELY PERFORMED DUE TO PREDICTABILITY OF AMP/PEN/VAN SUSCEPTIBILITY. Performed at [facility], [place] [address], [place], [place] [address]    Report Status 02/29/2020 FINAL  Final   Organism ID, Bacteria ESCHERICHIA COLI  Final      Susceptibility   Escherichia coli - MIC*    AMPICILLIN 16 INTERMEDIATE Intermediate     CEFAZOLIN <=4 SENSITIVE Sensitive     CEFEPIME <=0.12 SENSITIVE Sensitive     CEFTAZIDIME <=1 SENSITIVE Sensitive      CEFTRIAXONE <=0.25 SENSITIVE Sensitive     CIPROFLOXACIN 0.5 SENSITIVE Sensitive     GENTAMICIN 4 SENSITIVE Sensitive     IMIPENEM 0.5 SENSITIVE Sensitive     TRIMETH/SULFA >=320 RESISTANT Resistant     AMPICILLIN/SULBACTAM <=2 SENSITIVE Sensitive     PIP/TAZO <=4 SENSITIVE Sensitive     * RARE ESCHERICHIA COLI  SARS Coronavirus 2 by RT PCR (hospital order, performed in [place] hospital lab) Nasopharyngeal Nasopharyngeal Swab     Status: None   Collection Time: 02/26/20  6:22 PM   Specimen: Nasopharyngeal Swab  Result Value Ref Range Status   SARS Coronavirus 2 NEGATIVE NEGATIVE Final    Comment: (NOTE) SARS-CoV-2 target nucleic acids are NOT DETECTED.  The SARS-CoV-2 RNA is generally detectable in upper and lower respiratory specimens during the acute phase of infection. The lowest concentration of SARS-CoV-2 viral copies this assay can detect is 250 copies / mL. A negative result does not preclude SARS-CoV-2 infection and should not be used as the sole basis for treatment or other patient management decisions.  A negative result may occur with improper specimen collection / handling, submission of specimen other than nasopharyngeal swab, presence of viral mutation(s) within the areas targeted by this assay, and inadequate number of viral copies (<250 copies / mL). A negative result must be combined with clinical observations, patient history, and epidemiological information.  Fact Sheet for Patients:   StrictlyIdeas.no  Fact Sheet for Healthcare Providers: BankingDealers.co.za  This test is not yet approved or  cleared by the [place] FDA and has been authorized for detection and/or diagnosis of SARS-CoV-2 by FDA under an Emergency Use Authorization (EUA).  This EUA will remain in effect (meaning this test can be used) for the duration of the COVID-19  declaration under Section 564(b)(1) of the Act, 21 U.S.C. section  360bbb-3(b)(1), unless the authorization is terminated or revoked sooner.  Performed at [facility], [address], [place], [place] [address]   Blood Cultures x 2 sites     Status: None   Collection Time: 02/26/20 11:22 PM   Specimen: BLOOD  Result Value Ref Range Status   Specimen Description BLOOD LEFT ANTECUBITAL  Final   Special Requests   Final    BOTTLES DRAWN AEROBIC AND ANAEROBIC Blood Culture adequate volume   Culture   Final    NO GROWTH 5 DAYS Performed at [facility], [address] [place], [place] [address]    Report Status 03/02/2020 FINAL  Final  Blood Cultures x 2 sites     Status: None   Collection Time: 02/26/20 11:33 PM   Specimen: BLOOD LEFT HAND  Result Value Ref Range Status   Specimen Description BLOOD LEFT HAND  Final   Special Requests AEROBIC BOTTLE ONLY Blood Culture adequate volume  Final   Culture   Final    NO GROWTH 5 DAYS Performed at [facility], [address] [place], [place] [address]    Report Status 03/02/2020 FINAL  Final  Surgical pcr screen     Status: None   Collection Time: 03/01/20  1:11 AM   Specimen: Nasal Mucosa; Nasal Swab  Result Value Ref Range Status   MRSA, PCR NEGATIVE NEGATIVE Final   Staphylococcus aureus NEGATIVE NEGATIVE Final    Comment: (NOTE) The Xpert SA Assay (FDA approved for NASAL specimens in patients 24 years of age and older), is one component of a comprehensive surveillance program. It is not intended to diagnose infection nor to guide or monitor treatment. Performed at [facility], [place] [address] [place], [place] [address]       Radiology Studies: No results found.   Scheduled Meds: . aspirin EC  81 mg Oral Daily  . carbamazepine  100 mg Oral BID  . diltiazem  120 mg Oral Daily  . docusate sodium  100 mg Oral Daily  . DULoxetine  30 mg Oral QHS  . DULoxetine  60 mg Oral QHS  . enoxaparin (LOVENOX) injection  40 mg Subcutaneous Q24H  . HYDROmorphone  16 mg Oral BID  .  HYDROmorphone  24 mg Oral Daily  . insulin aspart  0-15 Units Subcutaneous TID WC  . insulin aspart  0-5 Units Subcutaneous QHS  . insulin aspart  4 Units Subcutaneous TID WC  . insulin glargine  20 Units Subcutaneous Q2200  . levothyroxine  200 mcg Oral Q0600  . metoprolol tartrate  50 mg Oral BID  . morphine  30 mg Oral QHS  . multivitamin with minerals  1 tablet Oral Daily  . nutrition supplement (JUVEN)  1 packet Oral BID BM  . pantoprazole  40 mg Oral Daily  . polyethylene glycol  17 g Oral Daily  . pregabalin  75 mg Oral Daily  . Ensure Max Protein  11 oz Oral QHS  . traZODone  150 mg Oral QHS   Continuous Infusions: . magnesium sulfate bolus IVPB       LOS: 8 days    Bonnell Public, MD 03/05/2020 7:35 PM  [facility] If 7PM-7AM, please contact night-coverage 03/05/2020, 7:35 PM

## 2020-03-06 DIAGNOSIS — L97521 Non-pressure chronic ulcer of other part of left foot limited to breakdown of skin: Secondary | ICD-10-CM

## 2020-03-06 LAB — GLUCOSE, CAPILLARY
Glucose-Capillary: 179 mg/dL — ABNORMAL HIGH (ref 70–99)
Glucose-Capillary: 227 mg/dL — ABNORMAL HIGH (ref 70–99)
Glucose-Capillary: 154 mg/dL — ABNORMAL HIGH (ref 70–99)
Glucose-Capillary: 174 mg/dL — ABNORMAL HIGH (ref 70–99)

## 2020-03-06 NOTE — TOC Progression Note (Signed)
Transition of Care [facility]) - Progression Note    Patient Details  Name: Jessica Dunlap MRN: [id number] Date of Birth: Aug 27, 1955  Transition of Care [facility]) CM/SW [place], [place] Phone Number: 03/06/2020, 10:29 AM  Clinical Narrative:    CSW called and left message with [facility] admissions liaison requesting Josem Kaufmann started. They are likely closed for holiday today. Will ensure f/u in morning as well.    Expected Discharge Plan: [place] Barriers to Discharge: Continued Medical Work up, Ship broker  Expected Discharge Plan and Services Expected Discharge Plan: [place] In-house Referral: Clinical Social Work Discharge Planning Services: CM Consult Post Acute Care Choice: Museum/gallery conservator, [place] Living arrangements for the past 2 months: Single Family Home     Readmission Risk Interventions Readmission Risk Prevention Plan 03/03/2020  Post Dischage Appt Not Complete  Appt Comments pt preference is for SNF  Medication Screening Complete  Transportation Screening Complete  Some recent data might be hidden

## 2020-03-06 NOTE — Plan of Care (Signed)
  Problem: Education: Goal: Knowledge of General Education information will improve Description Including pain rating scale, medication(s)/side effects and non-pharmacologic comfort measures Outcome: Progressing   

## 2020-03-06 NOTE — Progress Notes (Signed)
PROGRESS NOTE  LONDON NONAKA KVQ:[id number] DOB: 1955-10-03 DOA: 02/26/2020 PCP: Redmond School, MD  HPI/Recap of past 53 hours: 64 year old female past medical history significant for type 2 diabetes mellitus uncontrolled with secondary neuropathy, as well as hypertension, hypothyroidism, Karlene Lineman cirrhosis who was admitted on 8/28 for DKA and left foot diabetic ulcer.  Admitted to the hospital service and treated with insulin drip and sliding scale and able to be weaned off and drip.  Seen by vascular surgery and patient underwent BKA on 9/1.  Currently stable and being treated with antibiotics.  Plan is to go to skilled nursing facility near her home in [place].  No complaints today, eager to be discharged soon.  Assessment/Plan: Principal Problem: diabetes mellitus with secondary peripheral neuropathy resulting in DKA, type 2 ([place]): DKA since stabilized.  A1c surprising at 6.7.  Continue insulin and sliding scale. Active Problems:    Hypothyroidism: Continue Synthroid.    Hypertension: Continue blood pressure medications.    Coronary artery disease    Cirrhosis, non-alcoholic ([place]): Stable.    Diabetic foot ulcer ([place]) with left plantar base osteomyelitis: Status post left BKA.  Wound stable.  Continue antibiotics.  Plan to discharge to skilled nursing.    Code Status: Full code  Family Communication: Left message for family  Disposition Plan: Skilled nursing possible as early as tomorrow   Consultants:  Vascular surgery  Inpatient rehab  Procedures:  Status post left BKA done 9/1  Antimicrobials:  IV Flagyl 8/28-9/1  IV Rocephin 8/28-9/1  IV Ancef 9/1-9/2  IV Zosyn 8/28 only  DVT prophylaxis: Subcu Lovenox   Objective: Vitals:   03/06/20 0414 03/06/20 1421  BP: (!) 145/69 (!) 145/70  Pulse: 78 79  Resp: 17 17  Temp: 98.4 F (36.9 C) 98.5 F (36.9 C)  SpO2: 100% 97%    Intake/Output Summary (Last 24 hours) at 03/06/2020 1552 Last data  filed at 03/06/2020 1420 Gross per 24 hour  Intake 700 ml  Output --  Net 700 ml   Filed Weights   02/26/20 1645  Weight: 59.4 kg   Body mass index is 23.21 kg/m.  Exam:   General: Alert and oriented x3, no acute distress  Regular rate and rhythm, S1-S2  Lungs: Clear to auscultation bilaterally  Abdomen: Soft, nontender, nondistended, positive bowel sounds  Musculoskeletal: Status post BKA      Data Reviewed: CBC: Recent Labs  Lab 02/29/20 0148 02/29/20 0148 03/01/20 0352 03/01/20 0924 03/01/20 1222 03/02/20 0637 03/03/20 0139  WBC 4.3  --  5.3  --  4.8 6.2 5.8  NEUTROABS  --   --   --   --   --   --  3.9  HGB 7.8*   < > 8.1* 8.2* 7.4* 7.3* 8.4*  HCT 23.5*   < > 24.3* 24.0* 22.4* 21.7* 25.2*  MCV 90.7  --  89.3  --  90.0 91.6 90.6  PLT 163  --  180  --  172 185 199   < > = values in this interval not displayed.   Basic Metabolic Panel: Recent Labs  Lab 02/29/20 0148 02/29/20 0148 03/01/20 0352 03/01/20 0352 03/01/20 6433 03/01/20 1222 03/02/20 0637 03/03/20 0139 03/04/20 0300  NA 136   < > 136  --  138  --  136 135 136  K 3.9   < > 3.6  --  3.6  --  3.2* 3.1* 4.3  CL 105   < > 103  --  101  --  102 104 106  CO2 21*  --  22  --   --   --  23 25 25   GLUCOSE 224*   < > 162*  --  176*  --  188* 216* 212*  BUN <5*   < > <5*  --  <3*  --  <5* <5* <5*  CREATININE 0.88   < > 0.71   < > 0.50 0.69 0.76 0.68 0.55  CALCIUM 8.2*  --  8.6*  --   --   --  8.5* 8.4* 8.3*  MG  --   --   --   --   --   --   --   --  1.7  PHOS  --   --   --   --   --   --   --   --  3.3   < > = values in this interval not displayed.   GFR: Estimated Creatinine Clearance: 58.8 mL/min (by C-G formula based on SCr of 0.55 mg/dL). Liver Function Tests: Recent Labs  Lab 03/04/20 0300  ALBUMIN 2.5*   No results for input(s): LIPASE, AMYLASE in the last 168 hours. No results for input(s): AMMONIA in the last 168 hours. Coagulation Profile: No results for input(s): INR, PROTIME  in the last 168 hours. Cardiac Enzymes: No results for input(s): CKTOTAL, CKMB, CKMBINDEX, TROPONINI in the last 168 hours. BNP (last 3 results) No results for input(s): PROBNP in the last 8760 hours. HbA1C: No results for input(s): HGBA1C in the last 72 hours. CBG: Recent Labs  Lab 03/05/20 1227 03/05/20 1723 03/05/20 2039 03/06/20 0721 03/06/20 1208  GLUCAP 213* 223* 161* 179* 227*   Lipid Profile: No results for input(s): CHOL, HDL, LDLCALC, TRIG, CHOLHDL, LDLDIRECT in the last 72 hours. Thyroid Function Tests: No results for input(s): TSH, T4TOTAL, FREET4, T3FREE, THYROIDAB in the last 72 hours. Anemia Panel: No results for input(s): VITAMINB12, FOLATE, FERRITIN, TIBC, IRON, RETICCTPCT in the last 72 hours. Urine analysis:    Component Value Date/Time   COLORURINE YELLOW 09/04/2010 1852   APPEARANCEUR CLEAR 09/04/2010 1852   LABSPEC 1.025 09/04/2010 1852   PHURINE 5.0 09/04/2010 1852   GLUCOSEU NEGATIVE 09/04/2010 1852   HGBUR TRACE (A) 09/04/2010 1852   BILIRUBINUR NEGATIVE 09/04/2010 [place] 09/04/2010 1852   PROTEINUR 100 (A) 09/04/2010 1852   UROBILINOGEN 0.2 09/04/2010 1852   NITRITE NEGATIVE 09/04/2010 1852   LEUKOCYTESUR SMALL (A) 09/04/2010 1852   Sepsis Labs: @LABRCNTIP (procalcitonin:4,lacticidven:4)  ) Recent Results (from the past 240 hour(s))  Wound or Superficial Culture     Status: None   Collection Time: 02/26/20  5:01 PM   Specimen: Ulcer; Wound  Result Value Ref Range Status   Specimen Description   Final    ULCER Performed at [facility], [address] [place], [place] [address]    Special Requests   Final    NONE Performed at [place] [place], [place] [address]    Gram Stain   Final    FEW WBC PRESENT, PREDOMINANTLY PMN FEW GRAM NEGATIVE RODS RARE GRAM POSITIVE COCCI IN CLUSTERS    Culture   Final    RARE STREPTOCOCCUS AGALACTIAE RARE ESCHERICHIA COLI TESTING AGAINST S. AGALACTIAE NOT  ROUTINELY PERFORMED DUE TO PREDICTABILITY OF AMP/PEN/VAN SUSCEPTIBILITY. Performed at [facility], [place] [address], [place], [place] [address]    Report Status 02/29/2020 FINAL  Final   Organism ID, Bacteria ESCHERICHIA COLI  Final  Susceptibility   Escherichia coli - MIC*    AMPICILLIN 16 INTERMEDIATE Intermediate     CEFAZOLIN <=4 SENSITIVE Sensitive     CEFEPIME <=0.12 SENSITIVE Sensitive     CEFTAZIDIME <=1 SENSITIVE Sensitive     CEFTRIAXONE <=0.25 SENSITIVE Sensitive     CIPROFLOXACIN 0.5 SENSITIVE Sensitive     GENTAMICIN 4 SENSITIVE Sensitive     IMIPENEM 0.5 SENSITIVE Sensitive     TRIMETH/SULFA >=320 RESISTANT Resistant     AMPICILLIN/SULBACTAM <=2 SENSITIVE Sensitive     PIP/TAZO <=4 SENSITIVE Sensitive     * RARE ESCHERICHIA COLI  SARS Coronavirus 2 by RT PCR (hospital order, performed in [place] hospital lab) Nasopharyngeal Nasopharyngeal Swab     Status: None   Collection Time: 02/26/20  6:22 PM   Specimen: Nasopharyngeal Swab  Result Value Ref Range Status   SARS Coronavirus 2 NEGATIVE NEGATIVE Final    Comment: (NOTE) SARS-CoV-2 target nucleic acids are NOT DETECTED.  The SARS-CoV-2 RNA is generally detectable in upper and lower respiratory specimens during the acute phase of infection. The lowest concentration of SARS-CoV-2 viral copies this assay can detect is 250 copies / mL. A negative result does not preclude SARS-CoV-2 infection and should not be used as the sole basis for treatment or other patient management decisions.  A negative result may occur with improper specimen collection / handling, submission of specimen other than nasopharyngeal swab, presence of viral mutation(s) within the areas targeted by this assay, and inadequate number of viral copies (<250 copies / mL). A negative result must be combined with clinical observations, patient history, and epidemiological information.  Fact Sheet for Patients:    StrictlyIdeas.no  Fact Sheet for Healthcare Providers: BankingDealers.co.za  This test is not yet approved or  cleared by the [place] FDA and has been authorized for detection and/or diagnosis of SARS-CoV-2 by FDA under an Emergency Use Authorization (EUA).  This EUA will remain in effect (meaning this test can be used) for the duration of the COVID-19 declaration under Section 564(b)(1) of the Act, 21 U.S.C. section 360bbb-3(b)(1), unless the authorization is terminated or revoked sooner.  Performed at [facility], [address], [place], [place] [address]   Blood Cultures x 2 sites     Status: None   Collection Time: 02/26/20 11:22 PM   Specimen: BLOOD  Result Value Ref Range Status   Specimen Description BLOOD LEFT ANTECUBITAL  Final   Special Requests   Final    BOTTLES DRAWN AEROBIC AND ANAEROBIC Blood Culture adequate volume   Culture   Final    NO GROWTH 5 DAYS Performed at [facility], [address], [place], [place] [address]    Report Status 03/02/2020 FINAL  Final  Blood Cultures x 2 sites     Status: None   Collection Time: 02/26/20 11:33 PM   Specimen: BLOOD LEFT HAND  Result Value Ref Range Status   Specimen Description BLOOD LEFT HAND  Final   Special Requests AEROBIC BOTTLE ONLY Blood Culture adequate volume  Final   Culture   Final    NO GROWTH 5 DAYS Performed at [facility], [address] [place], [place] [address]    Report Status 03/02/2020 FINAL  Final  Surgical pcr screen     Status: None   Collection Time: 03/01/20  1:11 AM   Specimen: Nasal Mucosa; Nasal Swab  Result Value Ref Range Status   MRSA, PCR NEGATIVE NEGATIVE Final   Staphylococcus aureus NEGATIVE NEGATIVE Final  Comment: (NOTE) The Xpert SA Assay (FDA approved for NASAL specimens in patients 53 years of age and older), is one component of a comprehensive surveillance program. It is not intended to diagnose infection nor  to guide or monitor treatment. Performed at [facility], [place] [address] [place], [place] [address]       Studies: No results found.  Scheduled Meds: . aspirin EC  81 mg Oral Daily  . carbamazepine  100 mg Oral BID  . diltiazem  120 mg Oral Daily  . docusate sodium  100 mg Oral Daily  . DULoxetine  30 mg Oral QHS  . DULoxetine  60 mg Oral QHS  . enoxaparin (LOVENOX) injection  40 mg Subcutaneous Q24H  . HYDROmorphone  16 mg Oral BID  . HYDROmorphone  24 mg Oral Daily  . insulin aspart  0-15 Units Subcutaneous TID WC  . insulin aspart  0-5 Units Subcutaneous QHS  . insulin aspart  4 Units Subcutaneous TID WC  . insulin glargine  20 Units Subcutaneous Q2200  . levothyroxine  200 mcg Oral Q0600  . metoprolol tartrate  50 mg Oral BID  . morphine  30 mg Oral QHS  . multivitamin with minerals  1 tablet Oral Daily  . nutrition supplement (JUVEN)  1 packet Oral BID BM  . pantoprazole  40 mg Oral Daily  . polyethylene glycol  17 g Oral Daily  . pregabalin  75 mg Oral Daily  . Ensure Max Protein  11 oz Oral QHS  . traZODone  150 mg Oral QHS    Continuous Infusions: . magnesium sulfate bolus IVPB       LOS: 9 days     Annita Brod, MD [facility]   03/06/2020, 3:52 PM

## 2020-03-06 NOTE — Progress Notes (Signed)
Physical Therapy Treatment Patient Details Name: Jessica Dunlap MRN: [id number] DOB: 09-05-1955 Today's Date: 03/06/2020    History of Present Illness Pt is a 64 y.o. F with significant PMH of diabetes mellitus type 2, uncontrolled with hyperglycemia and neuropathy, hypertension, CAD, nonalcoholic cirrhosis who was admitted due to left foot diabetic ulcer as well as DKA. 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     PT Comments    The pt is making good progress with therapy at this time, but remains limited in mobility by poor functional strength to allow for movement of her RLE. The pt was able to complete multiple sit-stand transfers from the EOB and recliner, but was unable to progress gait training due to inability to progress RLE in standing. The pt resorts to a wiggle pattern of heel-toe movements, but is still only able to generate minimal clearance of her toes at this time. The pt then completed a series of heel-toe raises in sitting and standing to work on functional strengthening and stability to allow for these movements. The pt requires significantly increased support when mobilizing in standing, and will continue to benefit from skilled PT to progress these deficits and to facilitate return to independence with mobility.     Follow Up Recommendations  CIR     Equipment Recommendations   (defer to post acute)    Recommendations for Other Services       Precautions / Restrictions Precautions Precautions: Fall Restrictions Weight Bearing Restrictions: Yes LLE Weight Bearing: Non weight bearing Other Position/Activity Restrictions: difficulty with L knee extension    Mobility  Bed Mobility Overal bed mobility: Needs Assistance Bed Mobility: Supine to Sit     Supine to sit: Min assist     General bed mobility comments: minA for trunk elevation, use of bed rails  Transfers Overall transfer level: Needs assistance Equipment used: Rolling walker (2 wheeled) Transfers:  Public house manager;Sit to/from Stand Sit to Stand: Min assist   Squat pivot transfers: Mod assist     General transfer comment: minA to power up from bed. completed x10 through session from bed and recliner. modA for pivot to recliner with wiggle of toes/heel to generate movement as pt is unable to lift her RLE from the ground.  Ambulation/Gait             General Gait Details: unable      Balance Overall balance assessment: Needs assistance Sitting-balance support: Feet supported;Bilateral upper extremity supported Sitting balance-Leahy Scale: Fair Sitting balance - Comments: minG for safety, no LOB Postural control: Posterior lean;Right lateral lean Standing balance support: Bilateral upper extremity supported Standing balance-Leahy Scale: Poor Standing balance comment: reliant on external support               High Level Balance Comments: pt with increased need for assist when completing alternating heel and toe raises in standing and when attempting to wt shift laterally            Cognition Arousal/Alertness: Awake/alert Behavior During Therapy: Flat affect Overall Cognitive Status: Impaired/Different from baseline Area of Impairment: Attention;Following commands;Safety/judgement;Awareness;Problem solving                   Current Attention Level: Focused   Following Commands: Follows one step commands with increased time Safety/Judgement: Decreased awareness of safety;Decreased awareness of deficits Awareness: Intellectual Problem Solving: Decreased initiation;Requires verbal cues General Comments: flat affect but participatory through session. slowd response and movement in response to commands but the pt  was able to follow all commands with time      Exercises General Exercises - Lower Extremity Ankle Circles/Pumps: AROM;Right;20 reps;Seated Amputee Exercises Quad Sets: AROM;Left;10 reps;Supine    General Comments        Pertinent  Vitals/Pain Pain Assessment: Faces Faces Pain Scale: Hurts a little bit Pain Location: L residual limb with movement Pain Descriptors / Indicators: Operative site guarding;Grimacing Pain Intervention(s): Limited activity within patient's tolerance;Monitored during session           PT Goals (current goals can now be found in the care plan section) Acute Rehab PT Goals Patient Stated Goal: Patient wants to move better so she can return home. PT Goal Formulation: With patient Time For Goal Achievement: 03/16/20 Potential to Achieve Goals: Good Progress towards PT goals: Progressing toward goals    Frequency    Min 3X/week      PT Plan Current plan remains appropriate       AM-PAC PT "6 Clicks" Mobility   Outcome Measure  Help needed turning from your back to your side while in a flat bed without using bedrails?: A Little Help needed moving from lying on your back to sitting on the side of a flat bed without using bedrails?: A Little Help needed moving to and from a bed to a chair (including a wheelchair)?: A Lot Help needed standing up from a chair using your arms (e.g., wheelchair or bedside chair)?: A Little Help needed to walk in hospital room?: Total Help needed climbing 3-5 steps with a railing? : Total 6 Click Score: 13    End of Session Equipment Utilized During Treatment: Gait belt Activity Tolerance: Patient limited by fatigue Patient left: with call bell/phone within reach;in chair Nurse Communication: Mobility status PT Visit Diagnosis: Pain;Difficulty in walking, not elsewhere classified (R26.2);Other abnormalities of gait and mobility (R26.89);Unsteadiness on feet (R26.81) Pain - Right/Left: Left Pain - part of body:  (residual limb)     Time: 1610-9604 PT Time Calculation (min) (ACUTE ONLY): 38 min  Charges:  $Gait Training: 23-37 mins $Therapeutic Exercise: 8-22 mins                     Karma Ganja, PT, DPT   Acute Rehabilitation Department Pager  #: [phone]   Otho Bellows 03/06/2020, 4:27 PM

## 2020-03-07 DIAGNOSIS — L03116 Cellulitis of left lower limb: Secondary | ICD-10-CM

## 2020-03-07 LAB — GLUCOSE, CAPILLARY
Glucose-Capillary: 168 mg/dL — ABNORMAL HIGH (ref 70–99)
Glucose-Capillary: 268 mg/dL — ABNORMAL HIGH (ref 70–99)
Glucose-Capillary: 220 mg/dL — ABNORMAL HIGH (ref 70–99)
Glucose-Capillary: 245 mg/dL — ABNORMAL HIGH (ref 70–99)

## 2020-03-07 MED ORDER — INSULIN GLARGINE 100 UNIT/ML ~~LOC~~ SOLN
22.0000 [IU] | Freq: Every day | SUBCUTANEOUS | Status: DC
  Administered 2020-03-07: 22 [IU] via SUBCUTANEOUS
  Filled 2020-03-07 (×2): qty 0.22

## 2020-03-07 NOTE — Plan of Care (Signed)
  Problem: Education: Goal: Knowledge of General Education information will improve Description Including pain rating scale, medication(s)/side effects and non-pharmacologic comfort measures Outcome: Progressing   

## 2020-03-07 NOTE — Progress Notes (Signed)
Occupational Therapy Treatment Patient Details Name: Jessica Dunlap MRN: [id number] DOB: [birth year] Today's Date: 03/07/2020    History of present illness Pt is a 64 y.o. F with significant PMH of diabetes mellitus type 2, uncontrolled with hyperglycemia and neuropathy, hypertension, CAD, nonalcoholic cirrhosis who was admitted due to left foot diabetic ulcer as well as DKA. 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    OT comments  Patient demo decreased pain this date, improved bed mobility, EOB sitting balance and tolerance.  This has improved her ability to participate with sitting ADL.  Patient continues to present with declines compared to PLOF. SNF continues to be recommended so patient can max independence at w/c level prior to returning home.  OT to continue to follow in the acute setting.  Progression to stand hygiene/pant management and toilet transfers.     Follow Up Recommendations  SNF    Equipment Recommendations  3 in 1 bedside commode;Tub/shower bench;Wheelchair (measurements OT);Wheelchair cushion (measurements OT)    Recommendations for Other Services      Precautions / Restrictions Precautions Precautions: Fall Restrictions Weight Bearing Restrictions: Yes LLE Weight Bearing: Non weight bearing       Mobility Bed Mobility Overal bed mobility: Modified Independent Bed Mobility: Supine to Sit;Sit to Supine     Supine to sit: Modified independent (Device/Increase time);HOB elevated Sit to supine: Modified independent (Device/Increase time);HOB elevated                            Balance Overall balance assessment: Needs assistance Sitting-balance support: Feet supported Sitting balance-Leahy Scale: Good                                     ADL either performed or assessed with clinical judgement   ADL Overall ADL's : Needs assistance/impaired     Grooming: Wash/dry hands;Wash/dry face;Oral  care;Sitting;Supervision/safety Grooming Details (indicate cue type and reason): EOB Upper Body Bathing: Set up;Supervision/ safety;Sitting Upper Body Bathing Details (indicate cue type and reason): EOB         Lower Body Dressing: Supervision/safety;Set up;Minimal assistance Lower Body Dressing Details (indicate cue type and reason): sit EOB to donn sock.                                     General Comments ACE wrap in place to residual stump    Pertinent Vitals/ Pain       Faces Pain Scale: Hurts a little bit Pain Descriptors / Indicators: Dull Pain Intervention(s): Monitored during session                                                          Frequency  Min 2X/week        Progress Toward Goals  OT Goals(current goals can now be found in the care plan section)     Acute Rehab OT Goals Patient Stated Goal: Wants ST Rehab to maximize independence prior to returning home. OT Goal Formulation: With patient Time For Goal Achievement: 03/30/20 Potential to Achieve Goals: Good  Plan      Co-evaluation  AM-PAC OT "6 Clicks" Daily Activity     Outcome Measure   Help from another person eating meals?: None Help from another person taking care of personal grooming?: None Help from another person toileting, which includes using toliet, bedpan, or urinal?: A Little Help from another person bathing (including washing, rinsing, drying)?: A Lot Help from another person to put on and taking off regular upper body clothing?: A Little Help from another person to put on and taking off regular lower body clothing?: A Little 6 Click Score: 19    End of Session    OT Visit Diagnosis: Unsteadiness on feet (R26.81);Other abnormalities of gait and mobility (R26.89);Muscle weakness (generalized) (M62.81);Pain Pain - Right/Left: Left Pain - part of body: Leg   Activity Tolerance Patient tolerated treatment well    Patient Left in bed;with call bell/phone within reach   Nurse Communication  NT just finishing up with patient upon OT arrival.          Time: 1015-1035 OT Time Calculation (min): 20 min  Charges: OT General Charges $OT Visit: 1 Visit OT Treatments $Self Care/Home Management : 8-22 mins  03/07/2020  Rich, OTR/L  Acute Rehabilitation Services  Office:  [phone]    Metta Clines 03/07/2020, 10:41 AM

## 2020-03-07 NOTE — Progress Notes (Signed)
Nutrition Follow-up  DOCUMENTATION CODES:   Not applicable  INTERVENTION:   -D/c Juven -Continue Ensure Max po daily, each supplement provides 150 kcal and 30 grams of protein -Continue MVI with minerals daily  NUTRITION DIAGNOSIS:   Increased nutrient needs related to wound healing as evidenced by estimated needs.  Ongoing  GOAL:   Patient will meet greater than or equal to 90% of their needs  Progressing   MONITOR:   PO intake, Supplement acceptance, Diet advancement, Labs, Weight trends, Skin, I & O's  REASON FOR ASSESSMENT:   Consult Wound healing  ASSESSMENT:   Jessica Dunlap is a 64 y.o. female with a history of Diabetes type 2 with neuropathy, hypertension, hypothyroidism, depression, h/o cervical cancer. Has been followed for chronic foot ulceration by wound care.  9/1- s/p lt BKA  Reviewed I/O's: +1.1 L x 24 hours and +6.2 L since admission  Attempted to speak with pt via call to hospital room phone, however, unable to reach.   Pt with good appetite; noted meal completion 50-100%. Pt likes Ensure Max Protein supplements, however, is refusing Juven.  Medications reviewed and include cardizem.   Per TOC notes, awaiting insurance authorization for SNF placement.  Labs reviewed: CBGS: [id number] (inpatient orders for glycemic control are 0-15 units insulin aspart TID with meals, 0-5 units insulin aspart q HS, 4 units insulin aspart TID with meals, and 20 units insulin glargine daily).   Diet Order:   Diet Order            Diet heart healthy/carb modified Room service appropriate? Yes; Fluid consistency: Thin  Diet effective now                 EDUCATION NEEDS:   Education needs have been addressed  Skin:  Skin Assessment: Skin Integrity Issues: Skin Integrity Issues:: Incisions Diabetic Ulcer: - Incisions: s/p lt BKA  Last BM:  03/06/20  Height:   Ht Readings from Last 1 Encounters:  02/26/20 5\' 3"  (1.6 m)    Weight:   Wt Readings from Last  1 Encounters:  02/26/20 59.4 kg    Ideal Body Weight:  48.9 kg (adjusted for lt BKA)  BMI:  Body mass index is 23.21 kg/m.  Estimated Nutritional Needs:   Kcal:  7482-7078  Protein:  90-105 grams  Fluid:  > 1.7 L    Loistine Chance, RD, LDN, [place] Registered Dietitian II Certified Diabetes Care and Education Specialist Please refer to [facility] for RD and/or RD on-call/weekend/after hours pager

## 2020-03-07 NOTE — TOC Progression Note (Signed)
Transition of Care [facility]) - Progression Note    Patient Details  Name: Jessica Dunlap MRN: [id number] Date of Birth: 1956/02/08  Transition of Care [facility]) CM/SW [place], [place] Phone Number: 03/07/2020, 9:44 AM  Clinical Narrative:    Confirmed auth started this morning with [facility] SNF admissions liaison Mardene Celeste. I also left a HIPAA compliant message for pt sister Thornton Park to provide any update.    Expected Discharge Plan: [place] Barriers to Discharge: Continued Medical Work up, Ship broker  Expected Discharge Plan and Services Expected Discharge Plan: [place] In-house Referral: Clinical Social Work Discharge Planning Services: CM Consult Post Acute Care Choice: Museum/gallery conservator, [place] Living arrangements for the past 2 months: Single Family Home                   Readmission Risk Interventions Readmission Risk Prevention Plan 03/03/2020  Post Dischage Appt Not Complete  Appt Comments pt preference is for SNF  Medication Screening Complete  Transportation Screening Complete  Some recent data might be hidden

## 2020-03-07 NOTE — Progress Notes (Addendum)
     Rested well last night Left BKA healing well without new complaints.  S/P left BKA Stump appears viable.  Staples will be maintained for 4 weeks total Pending rehab  Roxy Horseman PA-C  Dressing changed. Some serous drainage. Mild ecchymosis on ant aspect of incision.  Awaiting placement.  I have written for daily dressing changes.   Deitra Mayo, MD Office: [phone]

## 2020-03-07 NOTE — Progress Notes (Signed)
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  Admitted to the hospital service and treated with insulin drip and sliding scale and able to be weaned off and drip.  Seen by vascular surgery and patient underwent BKA on 9/1.  Currently stable and being treated with antibiotics.  Plan is to go to skilled nursing facility near her home in [place].  No complaints today, eager to be discharged soon.  Assessment/Plan: Principal Problem: diabetes mellitus with secondary peripheral neuropathy resulting in DKA, type 2 ([place]): DKA since stabilized.  A1c surprising at 6.7 and yet here, she is currently requiring 22 units of Lantus.  No signs of additional infection..  Continue insulin and sliding scale.  CBGs trending upward slightly, increase Lantus to 22 units Active Problems:    Hypothyroidism: Continue Synthroid.    Hypertension: Continue blood pressure medications.    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    Diabetic foot ulcer ([place]) with left plantar base osteomyelitis: Status post left BKA.  Wound stable.  No longer on antibiotics.  Plan to discharge to skilled nursing.    Code Status: Full code  Family Communication: Left message for family  Disposition Plan: Skilled nursing once approved by insurance.   Consultants:  Vascular surgery  Inpatient rehab  Procedures:  Status post left BKA done 9/1  Antimicrobials:  IV Flagyl 8/28-9/1  IV Rocephin 8/28-9/1  IV Ancef 9/1-9/2  IV Zosyn 8/28 only  DVT prophylaxis: Subcu Lovenox   Objective: Vitals:   03/06/20 2101 03/07/20 0615  BP: (!) 155/68 (!) 149/70   Pulse: 81 80  Resp: 18 18  Temp: 98.6 F (37 C) 98.1 F (36.7 C)  SpO2: 100% 100%    Intake/Output Summary (Last 24 hours) at 03/07/2020 1335 Last data filed at 03/07/2020 1300 Gross per 24 hour  Intake 1260 ml  Output --  Net 1260 ml   Filed Weights   02/26/20 1645  Weight: 59.4 kg   Body mass index is 23.21 kg/m.  Exam: Unchanged from previous day.  General: Alert and oriented x3, no acute distress  Regular rate and rhythm, S1-S2  Lungs: Clear to auscultation bilaterally  Abdomen: Soft, nontender, nondistended, positive bowel sounds  Musculoskeletal: Status post BKA      Data Reviewed: CBC: Recent Labs  Lab 03/01/20 0352 03/01/20 0924 03/01/20 1222 03/02/20 0637 03/03/20 0139  WBC 5.3  --  4.8 6.2 5.8  NEUTROABS  --   --   --   --  3.9  HGB 8.1* 8.2* 7.4* 7.3* 8.4*  HCT 24.3* 24.0* 22.4* 21.7* 25.2*  MCV 89.3  --  90.0 91.6 90.6  PLT 180  --  172 185 242   Basic Metabolic Panel: Recent Labs  Lab 03/01/20 0352 03/01/20 0352 03/01/20 0924 03/01/20 1222 03/02/20 0637 03/03/20 0139 03/04/20 0300  NA 136  --  138  --  136 135 136  K 3.6  --  3.6  --  3.2* 3.1* 4.3  CL 103  --  101  --  102 104 106  CO2 22  --   --   --  23 25 25   GLUCOSE 162*  --  176*  --  188* 216* 212*  BUN <5*  --  <3*  --  <5* <5* <5*  CREATININE 0.71   < > 0.50 0.69 0.76 0.68 0.55  CALCIUM 8.6*  --   --   --  8.5* 8.4* 8.3*  MG  --   --   --   --   --   --  1.7  PHOS  --   --   --   --   --   --  3.3   < > = values in this interval not displayed.   GFR: Estimated Creatinine Clearance: 58.8 mL/min (by C-G formula based on SCr of 0.55 mg/dL). Liver Function Tests: Recent Labs  Lab 03/04/20 0300  ALBUMIN 2.5*   No results for input(s): LIPASE, AMYLASE in the last 168 hours. No results for input(s): AMMONIA in the last 168 hours. Coagulation Profile: No results for input(s): INR, PROTIME in the last 168 hours. Cardiac Enzymes: No results for input(s): CKTOTAL,  CKMB, CKMBINDEX, TROPONINI in the last 168 hours. BNP (last 3 results) No results for input(s): PROBNP in the last 8760 hours. HbA1C: No results for input(s): HGBA1C in the last 72 hours. CBG: Recent Labs  Lab 03/06/20 1208 03/06/20 1735 03/06/20 2102 03/07/20 0805 03/07/20 1236  GLUCAP 227* 154* 174* 168* 268*   Lipid Profile: No results for input(s): CHOL, HDL, LDLCALC, TRIG, CHOLHDL, LDLDIRECT in the last 72 hours. Thyroid Function Tests: No results for input(s): TSH, T4TOTAL, FREET4, T3FREE, THYROIDAB in the last 72 hours. Anemia Panel: No results for input(s): VITAMINB12, FOLATE, FERRITIN, TIBC, IRON, RETICCTPCT in the last 72 hours. Urine analysis:    Component Value Date/Time   COLORURINE YELLOW 09/04/2010 1852   APPEARANCEUR CLEAR 09/04/2010 1852   LABSPEC 1.025 09/04/2010 1852   PHURINE 5.0 09/04/2010 1852   GLUCOSEU NEGATIVE 09/04/2010 1852   HGBUR TRACE (A) 09/04/2010 1852   BILIRUBINUR NEGATIVE 09/04/2010 [place] 09/04/2010 1852   PROTEINUR 100 (A) 09/04/2010 1852   UROBILINOGEN 0.2 09/04/2010 1852   NITRITE NEGATIVE 09/04/2010 1852   LEUKOCYTESUR SMALL (A) 09/04/2010 1852   Sepsis Labs: @LABRCNTIP (procalcitonin:4,lacticidven:4)  ) Recent Results (from the past 240 hour(s))  Wound or Superficial Culture     Status: None   Collection Time: 02/26/20  5:01 PM   Specimen: Ulcer; Wound  Result Value Ref Range Status   Specimen Description   Final    ULCER Performed at [facility], [address] [place], [place] [address]    Special Requests   Final    NONE Performed at [facility] [place], [place] [address]    Gram Stain   Final    FEW WBC PRESENT, PREDOMINANTLY PMN FEW GRAM NEGATIVE RODS RARE GRAM POSITIVE COCCI IN CLUSTERS    Culture   Final    RARE STREPTOCOCCUS AGALACTIAE RARE ESCHERICHIA COLI TESTING AGAINST S. AGALACTIAE NOT ROUTINELY PERFORMED DUE TO PREDICTABILITY OF AMP/PEN/VAN  SUSCEPTIBILITY. Performed at [facility], [place] [address], [place], [place] [address]    Report Status 02/29/2020 FINAL  Final   Organism ID, Bacteria ESCHERICHIA COLI  Final      Susceptibility   Escherichia coli - MIC*    AMPICILLIN 16 INTERMEDIATE Intermediate     CEFAZOLIN <=4 SENSITIVE Sensitive     CEFEPIME <=0.12 SENSITIVE Sensitive     CEFTAZIDIME <=1 SENSITIVE Sensitive     CEFTRIAXONE <=0.25 SENSITIVE Sensitive     CIPROFLOXACIN 0.5 SENSITIVE Sensitive     GENTAMICIN  4 SENSITIVE Sensitive     IMIPENEM 0.5 SENSITIVE Sensitive     TRIMETH/SULFA >=320 RESISTANT Resistant     AMPICILLIN/SULBACTAM <=2 SENSITIVE Sensitive     PIP/TAZO <=4 SENSITIVE Sensitive     * RARE ESCHERICHIA COLI  SARS Coronavirus 2 by RT PCR (hospital order, performed in [facility] hospital lab) Nasopharyngeal Nasopharyngeal Swab     Status: None   Collection Time: 02/26/20  6:22 PM   Specimen: Nasopharyngeal Swab  Result Value Ref Range Status   SARS Coronavirus 2 NEGATIVE NEGATIVE Final    Comment: (NOTE) SARS-CoV-2 target nucleic acids are NOT DETECTED.  The SARS-CoV-2 RNA is generally detectable in upper and lower respiratory specimens during the acute phase of infection. The lowest concentration of SARS-CoV-2 viral copies this assay can detect is 250 copies / mL. A negative result does not preclude SARS-CoV-2 infection and should not be used as the sole basis for treatment or other patient management decisions.  A negative result may occur with improper specimen collection / handling, submission of specimen other than nasopharyngeal swab, presence of viral mutation(s) within the areas targeted by this assay, and inadequate number of viral copies (<250 copies / mL). A negative result must be combined with clinical observations, patient history, and epidemiological information.  Fact Sheet for Patients:   StrictlyIdeas.no  Fact Sheet for Healthcare  Providers: BankingDealers.co.za  This test is not yet approved or  cleared by the [place] FDA and has been authorized for detection and/or diagnosis of SARS-CoV-2 by FDA under an Emergency Use Authorization (EUA).  This EUA will remain in effect (meaning this test can be used) for the duration of the COVID-19 declaration under Section 564(b)(1) of the Act, 21 U.S.C. section 360bbb-3(b)(1), unless the authorization is terminated or revoked sooner.  Performed at [facility], [address] [place], [place] [address]   Blood Cultures x 2 sites     Status: None   Collection Time: 02/26/20 11:22 PM   Specimen: BLOOD  Result Value Ref Range Status   Specimen Description BLOOD LEFT ANTECUBITAL  Final   Special Requests   Final    BOTTLES DRAWN AEROBIC AND ANAEROBIC Blood Culture adequate volume   Culture   Final    NO GROWTH 5 DAYS Performed at [facility], [address], [place], [place] [address]    Report Status 03/02/2020 FINAL  Final  Blood Cultures x 2 sites     Status: None   Collection Time: 02/26/20 11:33 PM   Specimen: BLOOD LEFT HAND  Result Value Ref Range Status   Specimen Description BLOOD LEFT HAND  Final   Special Requests AEROBIC BOTTLE ONLY Blood Culture adequate volume  Final   Culture   Final    NO GROWTH 5 DAYS Performed at [facility], [address], [place], [place] [address]    Report Status 03/02/2020 FINAL  Final  Surgical pcr screen     Status: None   Collection Time: 03/01/20  1:11 AM   Specimen: Nasal Mucosa; Nasal Swab  Result Value Ref Range Status   MRSA, PCR NEGATIVE NEGATIVE Final   Staphylococcus aureus NEGATIVE NEGATIVE Final    Comment: (NOTE) The Xpert SA Assay (FDA approved for NASAL specimens in patients 28 years of age and older), is one component of a comprehensive surveillance program. It is not intended to diagnose infection nor to guide or monitor treatment. Performed at [facility], [place]  [address], [place], [place] [address]  Studies: No results found.  Scheduled Meds: . aspirin EC  81 mg Oral Daily  . carbamazepine  100 mg Oral BID  . diltiazem  120 mg Oral Daily  . docusate sodium  100 mg Oral Daily  . DULoxetine  30 mg Oral QHS  . DULoxetine  60 mg Oral QHS  . enoxaparin (LOVENOX) injection  40 mg Subcutaneous Q24H  . HYDROmorphone  16 mg Oral BID  . HYDROmorphone  24 mg Oral Daily  . insulin aspart  0-15 Units Subcutaneous TID WC  . insulin aspart  0-5 Units Subcutaneous QHS  . insulin aspart  4 Units Subcutaneous TID WC  . insulin glargine  22 Units Subcutaneous Q2200  . levothyroxine  200 mcg Oral Q0600  . metoprolol tartrate  50 mg Oral BID  . morphine  30 mg Oral QHS  . multivitamin with minerals  1 tablet Oral Daily  . nutrition supplement (JUVEN)  1 packet Oral BID BM  . pantoprazole  40 mg Oral Daily  . polyethylene glycol  17 g Oral Daily  . pregabalin  75 mg Oral Daily  . Ensure Max Protein  11 oz Oral QHS  . traZODone  150 mg Oral QHS    Continuous Infusions: . magnesium sulfate bolus IVPB       LOS: 10 days     Annita Brod, MD [facility]   03/07/2020, 1:35 PM

## 2020-03-07 NOTE — TOC Progression Note (Signed)
Transition of Care [facility]) - Progression Note    Patient Details  Name: Jessica Dunlap MRN: [id number] Date of Birth: 08-01-1955  Transition of Care [facility]) CM/SW [place], [place] Phone Number: 03/07/2020, 4:24 PM  Clinical Narrative:    CSW spoke with pt sister Thornton Park, provided update about insurance auth pending. She is aware, she and pt did discuss pt responsibility for portion of SNF bill. TOC team will keep family informed about any discharge planning updates.    Expected Discharge Plan: [place] Barriers to Discharge: Continued Medical Work up, Ship broker  Expected Discharge Plan and Services Expected Discharge Plan: [place] In-house Referral: Clinical Social Work Discharge Planning Services: CM Consult Post Acute Care Choice: Museum/gallery conservator, [place] Living arrangements for the past 2 months: Single Family Home  Readmission Risk Interventions Readmission Risk Prevention Plan 03/03/2020  Post Dischage Appt Not Complete  Appt Comments pt preference is for SNF  Medication Screening Complete  Transportation Screening Complete  Some recent data might be hidden

## 2020-03-07 NOTE — Progress Notes (Signed)
Physical Therapy Treatment Patient Details Name: Jessica Dunlap MRN: [id number] DOB: 15-Nov-1955 Today's Date: 03/07/2020    History of Present Illness Pt is a 64 y.o. F with significant PMH of diabetes mellitus type 2, uncontrolled with hyperglycemia and neuropathy, hypertension, CAD, nonalcoholic cirrhosis who was admitted due to left foot diabetic ulcer as well as DKA. 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     PT Comments    Jessica Dunlap is making good progress today with mobility but is struggling with the emotional challenge of losing a limb. We discussed community support options and that it was completely normal for her to be grieving the loss of her limb. She transferred bed to recliner with low squat pivot, no AD, with mod A. She also practiced full sit>stand from bed and recliner. Pt is unable to hop on RLE in part due to decreased UE strength. Worked on chair push ups and modified tricep dips with RW in standing.  Pt performed amputee exercises in supine and sitting. PT will continue to follow.   Follow Up Recommendations  CIR     Equipment Recommendations  Wheelchair (measurements PT);Rolling walker with 5" wheels    Recommendations for Other Services Rehab consult     Precautions / Restrictions Precautions Precautions: Fall Restrictions Weight Bearing Restrictions: Yes LLE Weight Bearing: Non weight bearing    Mobility  Bed Mobility Overal bed mobility: Modified Independent Bed Mobility: Supine to Sit     Supine to sit: Modified independent (Device/Increase time);HOB elevated Sit to supine: Modified independent (Device/Increase time);HOB elevated   General bed mobility comments: increased time needed but no physical assist  Transfers Overall transfer level: Needs assistance Equipment used: Rolling walker (2 wheeled) Transfers: Set designer Transfers;Sit to/from Stand Sit to Stand: Min assist   Squat pivot transfers: Mod assist     General transfer comment:  practiced sit<>stand from bed as well as recliner. Easier for her from recliner with arm rests. Worked on safe mvmt of hands from armrests to RW. Also practiced squat pivot without use of AD to simulate w/c transfer  Ambulation/Gait             General Gait Details: pt unable to hop but worked on pregait in standing including standing R heel raises x10 and standing tricep dips x5   Stairs             Wheelchair Mobility    Modified Rankin (Stroke Patients Only)       Balance Overall balance assessment: Needs assistance Sitting-balance support: Feet supported Sitting balance-Leahy Scale: Good Sitting balance - Comments: supervision, vc's for moving wt fwd Postural control: Posterior lean Standing balance support: Bilateral upper extremity supported Standing balance-Leahy Scale: Poor Standing balance comment: reliant on external support                            Cognition Arousal/Alertness: Awake/alert Behavior During Therapy: Flat affect Overall Cognitive Status: Within Functional Limits for tasks assessed                                 General Comments: pt appropriate throughout session today, relayed feeling down about the whole thing as well as the divorce she is going through.       Exercises Amputee Exercises Quad Sets: AROM;Left;10 reps;Supine Gluteal Sets: AROM;Both;10 reps;Supine Hip Extension: AROM;Left;Standing;10 reps Knee Flexion: AROM;Left;10 reps;Seated Knee Extension: AROM;Left;10  reps;Seated Straight Leg Raises: AROM;Left;10 reps;Supine Chair Push Up: AROM;10 reps    General Comments General comments (skin integrity, edema, etc.): pt would benefit from emotional support, really helped her to talk about how she was feeling and her fears. She also feels that not as many women have amputations as men. Discussed community support options.       Pertinent Vitals/Pain Pain Assessment: Faces Faces Pain Scale: Hurts  little more Pain Location: L residual limb with movement and phantom pain Pain Descriptors / Indicators: Dull;Pins and needles Pain Intervention(s): Limited activity within patient's tolerance;Monitored during session    Home Living                      Prior Function            PT Goals (current goals can now be found in the care plan section) Acute Rehab PT Goals Patient Stated Goal: Wants ST Rehab to maximize independence prior to returning home. PT Goal Formulation: With patient Time For Goal Achievement: 03/16/20 Potential to Achieve Goals: Good Progress towards PT goals: Progressing toward goals    Frequency    Min 3X/week      PT Plan Current plan remains appropriate    Co-evaluation              AM-PAC PT "6 Clicks" Mobility   Outcome Measure  Help needed turning from your back to your side while in a flat bed without using bedrails?: A Little Help needed moving from lying on your back to sitting on the side of a flat bed without using bedrails?: A Little Help needed moving to and from a bed to a chair (including a wheelchair)?: A Lot Help needed standing up from a chair using your arms (e.g., wheelchair or bedside chair)?: A Little Help needed to walk in hospital room?: Total Help needed climbing 3-5 steps with a railing? : Total 6 Click Score: 13    End of Session Equipment Utilized During Treatment: Gait belt Activity Tolerance: Patient tolerated treatment well Patient left: with call bell/phone within reach;in chair Nurse Communication: Mobility status PT Visit Diagnosis: Pain;Difficulty in walking, not elsewhere classified (R26.2);Other abnormalities of gait and mobility (R26.89);Unsteadiness on feet (R26.81) Pain - Right/Left: Left Pain - part of body: Leg     Time: 6811-5726 PT Time Calculation (min) (ACUTE ONLY): 32 min  Charges:  $Gait Training: 8-22 mins $Therapeutic Exercise: 8-22 mins                     Jessica Dunlap,  PT  Acute Rehab Services  Pager [phone] Office [place] 03/07/2020, 11:06 AM

## 2020-03-08 ENCOUNTER — Encounter (HOSPITAL_COMMUNITY): Payer: Self-pay | Admitting: Family Medicine

## 2020-03-08 LAB — CBC
HCT: 28.3 % — ABNORMAL LOW (ref 36.0–46.0)
Hemoglobin: 9.2 g/dL — ABNORMAL LOW (ref 12.0–15.0)
MCH: 30.2 pg (ref 26.0–34.0)
MCHC: 32.5 g/dL (ref 30.0–36.0)
MCV: 92.8 fL (ref 80.0–100.0)
Platelets: 165 10*3/uL (ref 150–400)
RBC: 3.05 MIL/uL — ABNORMAL LOW (ref 3.87–5.11)
RDW: 16 % — ABNORMAL HIGH (ref 11.5–15.5)
WBC: 5.6 10*3/uL (ref 4.0–10.5)
nRBC: 0.4 % — ABNORMAL HIGH (ref 0.0–0.2)

## 2020-03-08 LAB — GLUCOSE, CAPILLARY
Glucose-Capillary: 194 mg/dL — ABNORMAL HIGH (ref 70–99)
Glucose-Capillary: 165 mg/dL — ABNORMAL HIGH (ref 70–99)
Glucose-Capillary: 111 mg/dL — ABNORMAL HIGH (ref 70–99)
Glucose-Capillary: 167 mg/dL — ABNORMAL HIGH (ref 70–99)

## 2020-03-08 LAB — FERRITIN: Ferritin: 160 ng/mL (ref 11–307)

## 2020-03-08 LAB — BASIC METABOLIC PANEL
Anion gap: 11 (ref 5–15)
BUN: 10 mg/dL (ref 8–23)
CO2: 26 mmol/L (ref 22–32)
Calcium: 9 mg/dL (ref 8.9–10.3)
Chloride: 100 mmol/L (ref 98–111)
Creatinine, Ser: 0.6 mg/dL (ref 0.44–1.00)
GFR calc Af Amer: >60 mL/min (ref >60)
GFR calc non Af Amer: >60 mL/min (ref >60)
Glucose, Bld: 139 mg/dL — ABNORMAL HIGH (ref 70–99)
Potassium: 3.7 mmol/L (ref 3.5–5.1)
Sodium: 137 mmol/L (ref 135–145)

## 2020-03-08 MED ORDER — GLIPIZIDE 5 MG PO TABS
10.0000 mg | ORAL_TABLET | Freq: Every day | ORAL | Status: DC
  Administered 2020-03-08 – 2020-03-13 (×6): 10 mg via ORAL
  Filled 2020-03-08 (×6): qty 2

## 2020-03-08 MED ORDER — INSULIN GLARGINE 100 UNIT/ML ~~LOC~~ SOLN
5.0000 [IU] | Freq: Every day | SUBCUTANEOUS | Status: DC
  Administered 2020-03-08: 5 [IU] via SUBCUTANEOUS
  Filled 2020-03-08 (×2): qty 0.05

## 2020-03-08 MED ORDER — INSULIN GLARGINE 100 UNIT/ML ~~LOC~~ SOLN
15.0000 [IU] | Freq: Every day | SUBCUTANEOUS | Status: DC
  Filled 2020-03-08: qty 0.15

## 2020-03-08 MED ORDER — METFORMIN HCL 500 MG PO TABS
1000.0000 mg | ORAL_TABLET | Freq: Two times a day (BID) | ORAL | Status: DC
  Administered 2020-03-08 – 2020-03-13 (×11): 1000 mg via ORAL
  Filled 2020-03-08 (×11): qty 2

## 2020-03-08 NOTE — TOC Progression Note (Signed)
Transition of Care [facility]) - Progression Note    Patient Details  Name: Jessica Dunlap MRN: [id number] Date of Birth: Feb 03, 1956  Transition of Care [facility]) CM/SW [place], [place] Phone Number: 03/08/2020, 12:17 PM  Clinical Narrative:    Pt clinicals resent to SNF, auth pending.    Expected Discharge Plan: [place] Barriers to Discharge: Continued Medical Work up, Ship broker  Expected Discharge Plan and Services Expected Discharge Plan: [place] In-house Referral: Clinical Social Work Discharge Planning Services: CM Consult Post Acute Care Choice: Museum/gallery conservator, [place] Living arrangements for the past 2 months: Single Family Home                  Readmission Risk Interventions Readmission Risk Prevention Plan 03/03/2020  Post Dischage Appt Not Complete  Appt Comments pt preference is for SNF  Medication Screening Complete  Transportation Screening Complete  Some recent data might be hidden

## 2020-03-08 NOTE — Progress Notes (Signed)
PROGRESS NOTE  Jessica Dunlap  DOB: [birth year]  PCP: Redmond School, MD FWY:[id number]  DOA: 02/26/2020  LOS: 11 days   Chief Complaint  Patient presents with  . Foot Ulcer   Brief narrative: Jessica Dunlap is a 64 y.o. female with PMH of diabetes mellitus, peripheral neuropathy, hypertension, hypothyroidism, NASH cirrhosis Patient presented to the ED on 02/26/2020 with a left foot ulcer.  She was noted to be in DKA. She was admitted and managed for DKA per protocol with insulin drip, IV fluid and subsequently switched to subcutaneous insulin. Seen by vascular surgery.  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 Currently patient is pending insurance authorization to discharge to SNF.  Subjective: Patient was seen and examined this afternoon. Middle-aged Caucasian female.  Looks older for age.  Sitting up in the edge of the bed.  Has bandaged left BKA stump.  On wheelchair now  Assessment/Plan: DKA -Patient was on DKA at presentation. -She was admitted and managed for DKA per protocol with insulin drip, IV fluid and subsequently switched to subcutaneous insulin.  Diabetes mellitus type 2 with peripheral neuropathy -Interestingly she has never required insulin.  Prior to admission, she was on Metformin and glipizide.  Her hemoglobin A1c is also controlled at 6.7.  An infected foot also might have pushed her to DKA. -While in the hospital, oral pills are on hold and she is on subcutaneous insulin.  She received 22 units of Lantus last night. -I decided to resume her Metformin and glipizide this morning.  I stopped Premeal scheduled insulin.  Will reduce nightly dose of Lantus to 5 units for tonight. -Continue to adjust insulin based on blood sugar response.  Hopefully she will not require insulin long-term. 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  BKA stump is bandaged.  No soakage noted. -Completed the course of antibiotics.  On wheelchair now.  Will be evaluated as an outpatient for prosthesis. -SNF discharge pending.  Essential hypertension -Home meds include metoprolol 50 mg twice daily, Cardizem 120 mg daily, Lasix 20 mg daily, Aldactone 50 mg daily, -Currently blood pressure is controlled on home dose of metoprolol and Cardizem -Lasix and Aldactone remain on hold.  Coronary artery disease -Continue aspirin.  Not on a statin presumably because of liver cirrhosis  Nonalcoholic liver cirrhosis -Stable.  Hypothyroidism -Continue Synthroid  Acute anemia of blood loss -Likely secondary to surgical blood loss -On admission hemoglobin was 11.4, postsurgically it has been mostly between 7-9.  This morning at 9.2. -Obtain ferritin level. Recent Labs    02/26/20 1725 02/29/20 0148 03/01/20 0352 03/01/20 0924 03/01/20 1222 03/02/20 0637 03/03/20 0139 03/08/20 0439  HGB 11.4* 7.8* 8.1* 8.2* 7.4* 7.3* 8.4* 9.2*   Mobility: Limited mobility now because of BKA status.  On wheelchair Code Status:   Code Status: Full Code  Nutritional status: Body mass index is 23.21 kg/m. Nutrition Problem: Increased nutrient needs Etiology: wound healing Signs/Symptoms: estimated needs Diet Order            Diet heart healthy/carb modified Room service appropriate? Yes; Fluid consistency: Thin  Diet effective now                 DVT prophylaxis: enoxaparin (LOVENOX) injection 40 mg Start: 03/02/20 1000 SCD's Start: 03/01/20 1200   Antimicrobials:  Completed the course Fluid: None Consultants:  Vascular surgery, signed off Family Communication:  None at bedside  Status is: Inpatient  Remains inpatient appropriate because: Pending insurance authorization   Dispo: The patient is from: Home              Anticipated d/c is to: SNF              Anticipated d/c date is: Whenever bed/insurance authorization is  available              Patient currently is medically stable to d/c.       Infusions:  . magnesium sulfate bolus IVPB      Scheduled Meds: . aspirin EC  81 mg Oral Daily  . carbamazepine  100 mg Oral BID  . diltiazem  120 mg Oral Daily  . docusate sodium  100 mg Oral Daily  . DULoxetine  30 mg Oral QHS  . DULoxetine  60 mg Oral QHS  . enoxaparin (LOVENOX) injection  40 mg Subcutaneous Q24H  . glipiZIDE  10 mg Oral Daily  . HYDROmorphone  16 mg Oral BID  . HYDROmorphone  24 mg Oral Daily  . insulin aspart  0-15 Units Subcutaneous TID WC  . insulin aspart  0-5 Units Subcutaneous QHS  . insulin glargine  15 Units Subcutaneous Q2200  . levothyroxine  200 mcg Oral Q0600  . metFORMIN  1,000 mg Oral BID WC  . metoprolol tartrate  50 mg Oral BID  . morphine  30 mg Oral QHS  . multivitamin with minerals  1 tablet Oral Daily  . pantoprazole  40 mg Oral Daily  . polyethylene glycol  17 g Oral Daily  . pregabalin  75 mg Oral Daily  . Ensure Max Protein  11 oz Oral QHS  . traZODone  150 mg Oral QHS    Antimicrobials: Anti-infectives (From admission, onward)   Start     Dose/Rate Route Frequency Ordered Stop   03/01/20 2200  ceFAZolin (ANCEF) IVPB 2g/100 mL premix        2 g 200 mL/hr over 30 Minutes Intravenous Every 8 hours 03/01/20 1159 03/02/20 0512   02/26/20 2220  cefTRIAXone (ROCEPHIN) 2 g in sodium chloride 0.9 % 100 mL IVPB  Status:  Discontinued       "And" Linked Group Details   2 g 200 mL/hr over 30 Minutes Intravenous Every 24 hours 02/26/20 2220 03/01/20 1159   02/26/20 2220  metroNIDAZOLE (FLAGYL) IVPB 500 mg  Status:  Discontinued       "And" Linked Group Details   500 mg 100 mL/hr over 60 Minutes Intravenous Every 8 hours 02/26/20 2220 03/01/20 1159   02/26/20 1715  piperacillin-tazobactam (ZOSYN) IVPB 3.375 g        3.375 g 100 mL/hr over 30 Minutes Intravenous  Once 02/26/20 1701 02/26/20 1809      PRN meds: acetaminophen **OR** acetaminophen, alum &  mag hydroxide-simeth, bisacodyl, dextrose, guaiFENesin-dextromethorphan, hydrALAZINE, HYDROmorphone (DILAUDID) injection, labetalol, magnesium sulfate bolus IVPB, metoprolol tartrate, ondansetron, oxyCODONE-acetaminophen, phenol, polyvinyl alcohol, senna-docusate, sodium phosphate   Objective: Vitals:   03/08/20 0456 03/08/20 1508  BP: (!) 118/56 (!) 148/71  Pulse: 74 75  Resp: 17 16  Temp: 98.7 F (37.1 C) 97.9 F (36.6 C)  SpO2: 96% 100%    Intake/Output Summary (Last 24 hours) at 03/08/2020 1644 Last data filed at 03/08/2020 1639 Gross per 24 hour  Intake 520 ml  Output --  Net 520 ml   Filed Weights   02/26/20 1645  Weight: 59.4  kg   Weight change:  Body mass index is 23.21 kg/m.   Physical Exam: General exam: Appears calm and comfortable.  Not in physical distress Skin: No rashes, lesions or ulcers. HEENT: Atraumatic, normocephalic, supple neck, no obvious bleeding Lungs: Clear to auscultation bilaterally CVS: Regular rate and rhythm, no murmur GI/Abd soft, nontender, nondistended, bowel sound present CNS: Alert, awake, oriented x3 Psychiatry: Mood appropriate Extremities: Left BKA status.  Bandaged stump.  Right no pedal edema, calf tenderness  Data Review: I have personally reviewed the laboratory data and studies available.  Recent Labs  Lab 03/02/20 0637 03/03/20 0139 03/08/20 0439  WBC 6.2 5.8 5.6  NEUTROABS  --  3.9  --   HGB 7.3* 8.4* 9.2*  HCT 21.7* 25.2* 28.3*  MCV 91.6 90.6 92.8  PLT 185 199 165   Recent Labs  Lab 03/02/20 0637 03/03/20 0139 03/04/20 0300 03/08/20 0439  NA 136 135 136 137  K 3.2* 3.1* 4.3 3.7  CL 102 104 106 100  CO2 23 25 25 26   GLUCOSE 188* 216* 212* 139*  BUN <5* <5* <5* 10  CREATININE 0.76 0.68 0.55 0.60  CALCIUM 8.5* 8.4* 8.3* 9.0  MG  --   --  1.7  --   PHOS  --   --  3.3  --    Signed, Terrilee Croak, MD [facility] Pager: [phone] (Secure Chat preferred). 03/08/2020

## 2020-03-08 NOTE — Progress Notes (Addendum)
Inpatient Diabetes Program Recommendations  AACE/ADA: New Consensus Statement on Inpatient Glycemic Control (2015)  Target Ranges:  Prepandial:   less than 140 mg/dL      Peak postprandial:   less than 180 mg/dL (1-2 hours)      Critically ill patients:  140 - 180 mg/dL   Lab Results  Component Value Date   GLUCAP 165 (H) 03/08/2020   HGBA1C 6.7 (H) 02/27/2020    Review of Glycemic Control Results for Jessica Dunlap, Jessica Dunlap (MRN [id number]) as of 03/08/2020 14:45  Ref. Range 03/07/2020 12:36 03/07/2020 17:12 03/07/2020 20:55 03/08/2020 07:31 03/08/2020 12:04  Glucose-Capillary Latest Ref Range: 70 - 99 mg/dL 268 (H) 220 (H) 245 (H) 194 (H) 165 (H)   Diabetes history: DM 2 Outpatient Diabetes medications:  Glucotrol 10 mg daily, Metformin 500 mg bid Current orders for Inpatient glycemic control:  Novolog moderate tid with meals and HS Lantus 15 units q HS Glucotrol 10 mg daily Inpatient Diabetes Program Recommendations:    Note that patient has required basal and correction insulin while in the hospital, although it does not appear that she was on insulin prior to admit.  A1C indicates good control of DM prior to admit? Note plans for d/c to SNF at d/c.  May want to continue basal insulin at SNF as well and trend blood sugars. Patient has never been on insulin before.   Will follow.   Thanks,  Adah Perl, RN, BC-ADM Inpatient Diabetes Coordinator Pager [phone] (8a-5p)

## 2020-03-08 NOTE — Progress Notes (Signed)
[facility] notified for a K-pad to help with chronic back pain. Arthor Captain LPN

## 2020-03-09 LAB — GLUCOSE, CAPILLARY
Glucose-Capillary: 156 mg/dL — ABNORMAL HIGH (ref 70–99)
Glucose-Capillary: 135 mg/dL — ABNORMAL HIGH (ref 70–99)
Glucose-Capillary: 215 mg/dL — ABNORMAL HIGH (ref 70–99)
Glucose-Capillary: 108 mg/dL — ABNORMAL HIGH (ref 70–99)

## 2020-03-09 MED ORDER — FUROSEMIDE 20 MG PO TABS
20.0000 mg | ORAL_TABLET | Freq: Every day | ORAL | Status: DC
  Administered 2020-03-09 – 2020-03-13 (×5): 20 mg via ORAL
  Filled 2020-03-09 (×5): qty 1

## 2020-03-09 NOTE — Plan of Care (Signed)
°  Problem: Clinical Measurements: Goal: Ability to maintain clinical measurements within normal limits will improve Outcome: Progressing Goal: Will remain free from infection Outcome: Progressing Goal: Diagnostic test results will improve Outcome: Progressing Goal: Respiratory complications will improve Outcome: Progressing Goal: Cardiovascular complication will be avoided Outcome: Progressing   Problem: Activity: Goal: Risk for activity intolerance will decrease Outcome: Progressing   Problem: Coping: Goal: Level of anxiety will decrease Outcome: Progressing   Problem: Nutrition: Goal: Adequate nutrition will be maintained Outcome: Progressing   Problem: Elimination: Goal: Will not experience complications related to bowel motility Outcome: Progressing Goal: Will not experience complications related to urinary retention Outcome: Progressing   Problem: Pain Managment: Goal: General experience of comfort will improve Outcome: Progressing   Problem: Pain Managment: Goal: General experience of comfort will improve Outcome: Progressing   Problem: Safety: Goal: Ability to remain free from injury will improve Outcome: Progressing   Problem: Skin Integrity: Goal: Risk for impaired skin integrity will decrease Outcome: Progressing

## 2020-03-09 NOTE — Progress Notes (Signed)
   VASCULAR SURGERY ASSESSMENT & PLAN:   8 Days Post-Op. Left BKA secondary to gangrene of left foot. Surgically stable. Pain controlled. Good left knee extension. Hgb stable s/p 1 unit PRBCs on 9/2. Awaiting SNF placement.   SUBJECTIVE:   No complaints. RN at bedside. Dressing was changed by night shift staff.  Her RN today performed dressing change yesterday and reports intact staple line. No bleeding.  PHYSICAL EXAM:   Vitals:   03/08/20 0456 03/08/20 1508 03/08/20 2042 03/09/20 0512  BP: (!) 118/56 (!) 148/71 (!) 168/67 (!) 152/66  Pulse: 74 75 75 79  Resp: 17 16 15 15   Temp: 98.7 F (37.1 C) 97.9 F (36.6 C) 98.1 F (36.7 C) 98.2 F (36.8 C)  TempSrc: Oral Oral Oral Oral  SpO2: 96% 100% 100% 100%  Weight:      Height:       General apprearance: awake, alert in NAD CV: RRR Resp: nonlabored LLE: Dressing dry and intact. (I did not remove dressing.) Good left knee extension  LABS:   Lab Results  Component Value Date   WBC 5.6 03/08/2020   HGB 9.2 (L) 03/08/2020   HCT 28.3 (L) 03/08/2020   MCV 92.8 03/08/2020   PLT 165 03/08/2020   Lab Results  Component Value Date   CREATININE 0.60 03/08/2020   Lab Results  Component Value Date   INR 1.95 (H) 09/06/2010   CBG (last 3)  Recent Labs    03/08/20 1638 03/08/20 2108 03/09/20 0821  GLUCAP 111* 167* 156*    PROBLEM LIST:    Principal Problem:   DKA, type 2 (HCC) Active Problems:   Uncontrolled type 2 diabetes with neuropathy (HCC)   Hypothyroidism   Hypertension   Coronary artery disease   Cirrhosis, non-alcoholic (HCC)   Diabetic foot ulcer ([place])   DKA (diabetic ketoacidoses) ([place])   Diabetic ulcer of foot associated with diabetes mellitus due to underlying condition, limited to breakdown of skin ([place])   Cellulitis of left lower extremity   CURRENT MEDS:   . aspirin EC  81 mg Oral Daily  . carbamazepine  100 mg Oral BID  . diltiazem  120 mg Oral Daily  . docusate sodium  100 mg Oral Daily   . DULoxetine  30 mg Oral QHS  . DULoxetine  60 mg Oral QHS  . enoxaparin (LOVENOX) injection  40 mg Subcutaneous Q24H  . glipiZIDE  10 mg Oral Daily  . HYDROmorphone  16 mg Oral BID  . HYDROmorphone  24 mg Oral Daily  . insulin aspart  0-15 Units Subcutaneous TID WC  . insulin aspart  0-5 Units Subcutaneous QHS  . insulin glargine  5 Units Subcutaneous Q2200  . levothyroxine  200 mcg Oral Q0600  . metFORMIN  1,000 mg Oral BID WC  . metoprolol tartrate  50 mg Oral BID  . morphine  30 mg Oral QHS  . multivitamin with minerals  1 tablet Oral Daily  . pantoprazole  40 mg Oral Daily  . polyethylene glycol  17 g Oral Daily  . pregabalin  75 mg Oral Daily  . Ensure Max Protein  11 oz Oral QHS  . traZODone  150 mg Oral QHS    Risa Grill, [place] Office: [phone] 03/09/2020

## 2020-03-09 NOTE — TOC Progression Note (Addendum)
Transition of Care [facility]) - Progression Note    Patient Details  Name: MARCEDES TECH MRN: [id number] Date of Birth: 08/17/1955  Transition of Care [facility]) CM/SW [place], [place] Phone Number: 03/09/2020, 10:41 AM  Clinical Narrative:    2:47pm- Spoke with Dr. Pietro Cassis, explained delay, new COVID ordered.   2:01pm- CSW spent over 75 mins waiting for customer and speaking with [facility] liaison. Pt Josem Kaufmann has been received and is still processing, they state their contacts are as follows as pt is managed by 3rd party [place] commercial plan. [phone], fax [phone]. CSW also spoke with pt and pt sister [place] to let them know what was going on with delay. CSW team continues to follow, admissions liaison Mardene Celeste and I have been in contact.   10:41am- CSW spoke with Mardene Celeste in admissions at [facility], their office has had difficulty with pt commercial plan. Insurance auth still pending.    Expected Discharge Plan: [place] Barriers to Discharge: Continued Medical Work up, Ship broker  Expected Discharge Plan and Services Expected Discharge Plan: [place] In-house Referral: Clinical Social Work Discharge Planning Services: CM Consult Post Acute Care Choice: Museum/gallery conservator, [place] Living arrangements for the past 2 months: Single Family Home  Readmission Risk Interventions Readmission Risk Prevention Plan 03/03/2020  Post Dischage Appt Not Complete  Appt Comments pt preference is for SNF  Medication Screening Complete  Transportation Screening Complete  Some recent data might be hidden

## 2020-03-09 NOTE — Plan of Care (Signed)
  Problem: Education: Goal: Knowledge of General Education information will improve Description Including pain rating scale, medication(s)/side effects and non-pharmacologic comfort measures Outcome: Progressing   

## 2020-03-09 NOTE — Progress Notes (Signed)
PROGRESS NOTE  Jessica Dunlap  DOB: May 02, 1956  PCP: Redmond School, MD VEL:[id number]  DOA: 02/26/2020  LOS: 12 days   Chief Complaint  Patient presents with  . Foot Ulcer   Brief narrative: Jessica Dunlap is a 64 y.o. female with PMH of diabetes mellitus, peripheral neuropathy, hypertension, hypothyroidism, NASH cirrhosis Patient presented to the ED on 02/26/2020 with a left foot ulcer.  She was noted to be in DKA. She was admitted and managed for DKA per protocol with insulin drip, IV fluid and subsequently switched to subcutaneous insulin. Seen by vascular surgery.  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 Currently patient is pending insurance authorization to discharge to SNF.  Subjective: Patient was seen and examined this afternoon. Sitting up at the edge of the bed.  Not in distress.  Frustrated with the delay in obtaining insurance authorization.  Assessment/Plan: DKA -Patient was on DKA at presentation. -She was admitted and managed for DKA per protocol with insulin drip, IV fluid and subsequently switched to subcutaneous insulin.  Diabetes mellitus type 2 with peripheral neuropathy -Interestingly she has never required insulin.  Prior to admission, she was on Metformin and glipizide.  Her hemoglobin A1c is also controlled at 6.7.  An infected foot might have pushed her to DKA. -While in the hospital, oral pills are on hold and she is on subcutaneous insulin.   -9/8, oral Metformin and glipizide resumed.  Lantus dose was cut down.  I would completely stop Lantus at this point.  -Continue sliding scale insulin with Accu-Cheks. 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  BKA stump is bandaged.  No soakage noted. -Completed the course of antibiotics.  On wheelchair now.  Will be evaluated as an outpatient for  prosthesis. -SNF discharge pending.  Essential hypertension -Home meds include metoprolol 50 mg twice daily, Cardizem 120 mg daily, Lasix 20 mg daily, Aldactone 50 mg daily, -Currently on metoprolol and Cardizem.  Blood pressure seems to be trending up.  Resumed Lasix this morning.  Aldactone remains on hold.    Coronary artery disease -Continue aspirin.  Not on a statin presumably because of liver cirrhosis  Nonalcoholic liver cirrhosis -Stable.  Hypothyroidism -Continue Synthroid  Acute anemia of blood loss -Likely secondary to surgical blood loss -On admission hemoglobin was 11.4, postsurgically it has been mostly between 7-9.  On last blood check on 9/8, it was 9.2.  Repeat tomorrow. -Ferritin level adequate at 160. Recent Labs    02/26/20 1725 02/29/20 0148 03/01/20 0352 03/01/20 0924 03/01/20 1222 03/02/20 0637 03/03/20 0139 03/08/20 0439  HGB 11.4* 7.8* 8.1* 8.2* 7.4* 7.3* 8.4* 9.2*   Recent Labs    03/08/20 1821  FERRITIN 160    Mobility: Limited mobility now because of BKA status.  On wheelchair Code Status:   Code Status: Full Code  Nutritional status: Body mass index is 23.21 kg/m. Nutrition Problem: Increased nutrient needs Etiology: wound healing Signs/Symptoms: estimated needs Diet Order            Diet heart healthy/carb modified Room service appropriate? Yes; Fluid consistency: Thin  Diet effective now                 DVT prophylaxis: enoxaparin (LOVENOX) injection 40 mg Start: 03/02/20 1000 SCD's Start: 03/01/20 1200   Antimicrobials:  Completed the course Fluid: None Consultants: Vascular  surgery, signed off Family Communication:  None at bedside  Status is: Inpatient  Remains inpatient appropriate because: Pending insurance authorization   Dispo: The patient is from: Home              Anticipated d/c is to: SNF              Anticipated d/c date is: Whenever bed/insurance authorization is available              Patient currently  is medically stable to d/c.       Infusions:  . magnesium sulfate bolus IVPB      Scheduled Meds: . aspirin EC  81 mg Oral Daily  . carbamazepine  100 mg Oral BID  . diltiazem  120 mg Oral Daily  . docusate sodium  100 mg Oral Daily  . DULoxetine  30 mg Oral QHS  . DULoxetine  60 mg Oral QHS  . enoxaparin (LOVENOX) injection  40 mg Subcutaneous Q24H  . furosemide  20 mg Oral Daily  . glipiZIDE  10 mg Oral Daily  . HYDROmorphone  16 mg Oral BID  . HYDROmorphone  24 mg Oral Daily  . insulin aspart  0-15 Units Subcutaneous TID WC  . insulin aspart  0-5 Units Subcutaneous QHS  . levothyroxine  200 mcg Oral Q0600  . metFORMIN  1,000 mg Oral BID WC  . metoprolol tartrate  50 mg Oral BID  . morphine  30 mg Oral QHS  . multivitamin with minerals  1 tablet Oral Daily  . pantoprazole  40 mg Oral Daily  . polyethylene glycol  17 g Oral Daily  . pregabalin  75 mg Oral Daily  . Ensure Max Protein  11 oz Oral QHS  . traZODone  150 mg Oral QHS    Antimicrobials: Anti-infectives (From admission, onward)   Start     Dose/Rate Route Frequency Ordered Stop   03/01/20 2200  ceFAZolin (ANCEF) IVPB 2g/100 mL premix        2 g 200 mL/hr over 30 Minutes Intravenous Every 8 hours 03/01/20 1159 03/02/20 0512   02/26/20 2220  cefTRIAXone (ROCEPHIN) 2 g in sodium chloride 0.9 % 100 mL IVPB  Status:  Discontinued       "And" Linked Group Details   2 g 200 mL/hr over 30 Minutes Intravenous Every 24 hours 02/26/20 2220 03/01/20 1159   02/26/20 2220  metroNIDAZOLE (FLAGYL) IVPB 500 mg  Status:  Discontinued       "And" Linked Group Details   500 mg 100 mL/hr over 60 Minutes Intravenous Every 8 hours 02/26/20 2220 03/01/20 1159   02/26/20 1715  piperacillin-tazobactam (ZOSYN) IVPB 3.375 g        3.375 g 100 mL/hr over 30 Minutes Intravenous  Once 02/26/20 1701 02/26/20 1809      PRN meds: acetaminophen **OR** acetaminophen, alum & mag hydroxide-simeth, bisacodyl, dextrose,  guaiFENesin-dextromethorphan, hydrALAZINE, HYDROmorphone (DILAUDID) injection, labetalol, magnesium sulfate bolus IVPB, metoprolol tartrate, ondansetron, oxyCODONE-acetaminophen, phenol, polyvinyl alcohol, senna-docusate, sodium phosphate   Objective: Vitals:   03/09/20 0512 03/09/20 1317  BP: (!) 152/66 (!) 142/71  Pulse: 79 80  Resp: 15 16  Temp: 98.2 F (36.8 C) 97.9 F (36.6 C)  SpO2: 100% 100%    Intake/Output Summary (Last 24 hours) at 03/09/2020 1451 Last data filed at 03/09/2020 0952 Gross per 24 hour  Intake 420 ml  Output 400 ml  Net 20 ml   Filed Weights   02/26/20 1645  Weight: 59.4 kg  Weight change:  Body mass index is 23.21 kg/m.   Physical Exam: General exam: Appears calm and comfortable.  Not in physical distress. Skin: No rashes, lesions or ulcers. HEENT: Atraumatic, normocephalic, supple neck, no obvious bleeding Lungs: Clear to auscultation bilaterally CVS: Regular rate and rhythm, no murmur GI/Abd soft, nontender, nondistended, bowel sound present CNS: Alert, awake, oriented x3 Psychiatry: Frustrated. Extremities: Left BKA status.  Bandaged stump.  Right no pedal edema, calf tenderness  Data Review: I have personally reviewed the laboratory data and studies available.  Recent Labs  Lab 03/03/20 0139 03/08/20 0439  WBC 5.8 5.6  NEUTROABS 3.9  --   HGB 8.4* 9.2*  HCT 25.2* 28.3*  MCV 90.6 92.8  PLT 199 165   Recent Labs  Lab 03/03/20 0139 03/04/20 0300 03/08/20 0439  NA 135 136 137  K 3.1* 4.3 3.7  CL 104 106 100  CO2 25 25 26   GLUCOSE 216* 212* 139*  BUN <5* <5* 10  CREATININE 0.68 0.55 0.60  CALCIUM 8.4* 8.3* 9.0  MG  --  1.7  --   PHOS  --  3.3  --    Signed, Terrilee Croak, MD [facility] Pager: [phone] (Secure Chat preferred). 03/09/2020

## 2020-03-10 LAB — BASIC METABOLIC PANEL
Anion gap: 11 (ref 5–15)
BUN: 8 mg/dL (ref 8–23)
CO2: 27 mmol/L (ref 22–32)
Calcium: 9.2 mg/dL (ref 8.9–10.3)
Chloride: 99 mmol/L (ref 98–111)
Creatinine, Ser: 0.67 mg/dL (ref 0.44–1.00)
GFR calc Af Amer: >60 mL/min (ref >60)
GFR calc non Af Amer: >60 mL/min (ref >60)
Glucose, Bld: 159 mg/dL — ABNORMAL HIGH (ref 70–99)
Potassium: 4.2 mmol/L (ref 3.5–5.1)
Sodium: 137 mmol/L (ref 135–145)

## 2020-03-10 LAB — GLUCOSE, CAPILLARY
Glucose-Capillary: 164 mg/dL — ABNORMAL HIGH (ref 70–99)
Glucose-Capillary: 149 mg/dL — ABNORMAL HIGH (ref 70–99)
Glucose-Capillary: 101 mg/dL — ABNORMAL HIGH (ref 70–99)
Glucose-Capillary: 148 mg/dL — ABNORMAL HIGH (ref 70–99)

## 2020-03-10 LAB — CBC WITH DIFFERENTIAL/PLATELET
Abs Immature Granulocytes: 0.07 10*3/uL (ref 0.00–0.07)
Basophils Absolute: 0 10*3/uL (ref 0.0–0.1)
Basophils Relative: 1 %
Eosinophils Absolute: 0.2 10*3/uL (ref 0.0–0.5)
Eosinophils Relative: 3 %
HCT: 28.6 % — ABNORMAL LOW (ref 36.0–46.0)
Hemoglobin: 9.4 g/dL — ABNORMAL LOW (ref 12.0–15.0)
Immature Granulocytes: 1 %
Lymphocytes Relative: 25 %
Lymphs Abs: 1.5 10*3/uL (ref 0.7–4.0)
MCH: 29.9 pg (ref 26.0–34.0)
MCHC: 32.9 g/dL (ref 30.0–36.0)
MCV: 91.1 fL (ref 80.0–100.0)
Monocytes Absolute: 0.4 10*3/uL (ref 0.1–1.0)
Monocytes Relative: 6 %
Neutro Abs: 3.9 10*3/uL (ref 1.7–7.7)
Neutrophils Relative %: 64 %
Platelets: 156 10*3/uL (ref 150–400)
RBC: 3.14 MIL/uL — ABNORMAL LOW (ref 3.87–5.11)
RDW: 16.8 % — ABNORMAL HIGH (ref 11.5–15.5)
WBC: 6 10*3/uL (ref 4.0–10.5)
nRBC: 0 % (ref 0.0–0.2)

## 2020-03-10 LAB — SARS CORONAVIRUS 2 BY RT PCR (HOSPITAL ORDER, PERFORMED IN ~~LOC~~ HOSPITAL LAB): SARS Coronavirus 2: NEGATIVE

## 2020-03-10 NOTE — Progress Notes (Signed)
Occupational Therapy Treatment Patient Details Name: Jessica Dunlap MRN: [id number] DOB: 06/26/1956 Today's Date: 03/10/2020    History of present illness Pt is a 64 y.o. F with significant PMH of diabetes mellitus type 2, uncontrolled with hyperglycemia and neuropathy, hypertension, CAD, nonalcoholic cirrhosis who was admitted due to left foot diabetic ulcer as well as DKA. 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    OT comments  Pt seen for follow up OT session, progressing well toward stated goals. Session focused bil UE strengthening in preparation for improved functional ADL transfers. Pt completed exercises listed below to focus on latissimus, tricep, and posterior musculature to improve ability to sustain hop to mobility. Pt tolerate exercise well. Reports feeling depressed due to not yet d/cing. Pt able to squat pivot with min A this date. D/c recs remain appropriate for SNF, will continue to follow.   Follow Up Recommendations  SNF    Equipment Recommendations  3 in 1 bedside commode;Tub/shower bench;Wheelchair (measurements OT);Wheelchair cushion (measurements OT)    Recommendations for Other Services      Precautions / Restrictions Precautions Precautions: Fall Restrictions Weight Bearing Restrictions: Yes LLE Weight Bearing: Non weight bearing Other Position/Activity Restrictions: L BKA        Mobility Bed Mobility Overal bed mobility: Modified Independent             General bed mobility comments: Sitting EOB upon entry   Transfers Overall transfer level: Needs assistance Equipment used: Rolling walker (2 wheeled) Transfers: Sit to/from Stand Sit to Stand: Min assist Stand pivot transfers: Min assist       General transfer comment: Heavy min A for lift assist to stand this session. Min A for steadying to transfer to chair. Pt only able to perform pivotal movement on RLE. Still unable to take hop steps.     Balance Overall balance assessment: Needs  assistance Sitting-balance support: Feet supported Sitting balance-Leahy Scale: Good Sitting balance - Comments: supervision, vc's for moving wt fwd   Standing balance support: Bilateral upper extremity supported Standing balance-Leahy Scale: Poor Standing balance comment: Reliant on BUE and external support                            ADL either performed or assessed with clinical judgement   ADL Overall ADL's : Needs assistance/impaired                         Toilet Transfer: Minimal assistance;Squat-pivot;BSC Toilet Transfer Details (indicate cue type and reason): simulated to recliner; able to squat pivot with min A for guiding. Has not yet been able to progress to "hopping" mobility           General ADL Comments: session focused on BUE strengthening to increase transfer ability     Vision       Perception     Praxis      Cognition Arousal/Alertness: Awake/alert Behavior During Therapy: Flat affect Overall Cognitive Status: Within Functional Limits for tasks assessed                                 General Comments: pt reports she is feeling down/depressed due to stressors surrounding hospitalization and wanting to be d/c        Exercises Exercises: Amputee;General Lower Extremity General Exercises - Lower Extremity Heel Raises: AROM;10 reps;Standing;Right (using RW and min  A) Mini-Sqauts: AROM;Right;10 reps;Standing (with RW and min A ) Amputee Exercises Hip Flexion/Marching: AROM;Both;10 reps;Seated Knee Extension: AROM;Both;10 reps;Seated Chair Push Up: AROM;10 reps;Seated Other Exercises Other Exercises: UE: level one theraband- latissimus pull downs x10 with focus on scapular retraction; tricep extensions x10 each arm. Pt tolerated well with appropriate amount of fatigue   Shoulder Instructions       General Comments      Pertinent Vitals/ Pain       Pain Assessment: Faces Faces Pain Scale: Hurts little  more Pain Location: L residual limb with movement and phantom pain Pain Descriptors / Indicators: Aching Pain Intervention(s): Limited activity within patient's tolerance;Monitored during session  Home Living                                          Prior Functioning/Environment              Frequency  Min 2X/week        Progress Toward Goals  OT Goals(current goals can now be found in the care plan section)  Progress towards OT goals: Progressing toward goals  Acute Rehab OT Goals Patient Stated Goal: to go to rehab  OT Goal Formulation: With patient Time For Goal Achievement: 03/30/20 Potential to Achieve Goals: Good  Plan Discharge plan remains appropriate    Co-evaluation                 AM-PAC OT "6 Clicks" Daily Activity     Outcome Measure   Help from another person eating meals?: None Help from another person taking care of personal grooming?: None Help from another person toileting, which includes using toliet, bedpan, or urinal?: A Little Help from another person bathing (including washing, rinsing, drying)?: A Lot Help from another person to put on and taking off regular upper body clothing?: A Little Help from another person to put on and taking off regular lower body clothing?: A Little 6 Click Score: 19    End of Session    OT Visit Diagnosis: Unsteadiness on feet (R26.81);Other abnormalities of gait and mobility (R26.89);Muscle weakness (generalized) (M62.81);Pain Pain - Right/Left: Left Pain - part of body: Leg   Activity Tolerance Patient tolerated treatment well   Patient Left in bed;with call bell/phone within reach   Nurse Communication Mobility status        Time: 2010-0712 OT Time Calculation (min): 21 min  Charges: OT General Charges $OT Visit: 1 Visit OT Treatments $Therapeutic Activity: 8-22 mins  Zenovia Jarred, MSOT, OTR/L [facility] Office Number: [phone] Pager:  [phone]  Zenovia Jarred 03/10/2020, 5:44 PM

## 2020-03-10 NOTE — TOC Progression Note (Addendum)
Transition of Care [facility]) - Progression Note    Patient Details  Name: Jessica Dunlap MRN: [id number] Date of Birth: 03/13/1956  Transition of Care [facility]) CM/SW [place], [place] Phone Number:  03/10/2020, 11:49 AM  Clinical Narrative:   4:41pm- Spent 55 minutes calling [employer], finally received update from customer service rep that pt Jessica Dunlap is pending new PT notes. CSW spoke with [place], PT. Sent completed notes to [phone]. CSW has also called and left message with admissions liaison at [facility]. Case #[id number]   2:19pm- Spoke with admissions at [facility]; still no answer. Unable to get answer about if SNF waiver is in place.  1:43pm- Spoke with admissions at [facility]; still no answer from [employer]   11:49am- Continue to await insurance auth, pt was swabbed for COVID on 9/9 which resulted negative. TOC team continues to follow.    Expected Discharge Plan: [place] Barriers to Discharge: Continued Medical Work up, Ship broker  Expected Discharge Plan and Services Expected Discharge Plan: [place] In-house Referral: Clinical Social Work Discharge Planning Services: CM Consult Post Acute Care Choice: Museum/gallery conservator, [place] Living arrangements for the past 2 months: Single Family Home  Readmission Risk Interventions Readmission Risk Prevention Plan 03/03/2020  Post Dischage Appt Not Complete  Appt Comments pt preference is for SNF  Medication Screening Complete  Transportation Screening Complete  Some recent data might be hidden

## 2020-03-10 NOTE — Progress Notes (Signed)
Physical Therapy Treatment Patient Details Name: Jessica Dunlap MRN: [id number] DOB: [birth year] Today's Date: 03/10/2020    History of Present Illness Pt is a 64 y.o. F with significant PMH of diabetes mellitus type 2, uncontrolled with hyperglycemia and neuropathy, hypertension, CAD, nonalcoholic cirrhosis who was admitted due to left foot diabetic ulcer as well as DKA. 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     PT Comments    Pt progressing towards goals. Still unable to perform hop steps, however, was able to pivot on RLE to chair using RW this session. Required min A for steadying throughout. Performed seated and standing HEP this session. Pt reports plan is to d/c to SNF, so recommendations updated. Feel she would benefit to continue to increase independence and safety. Will continue to follow acutely.     Follow Up Recommendations  SNF     Equipment Recommendations  Wheelchair (measurements PT);Rolling walker with 5" wheels    Recommendations for Other Services       Precautions / Restrictions Precautions Precautions: Fall Restrictions Weight Bearing Restrictions: Yes LLE Weight Bearing: Non weight bearing Other Position/Activity Restrictions: L BKA     Mobility  Bed Mobility               General bed mobility comments: Sitting EOB upon entry   Transfers Overall transfer level: Needs assistance Equipment used: Rolling walker (2 wheeled) Transfers: Sit to/from [facility] Sit to Stand: Min assist Stand pivot transfers: Min assist       General transfer comment: Heavy min A for lift assist to stand this session. Min A for steadying to transfer to chair. Pt only able to perform pivotal movement on RLE. Still unable to take hop steps.   Ambulation/Gait                 Stairs             Wheelchair Mobility    Modified Rankin (Stroke Patients Only)       Balance Overall balance assessment: Needs  assistance Sitting-balance support: Feet supported Sitting balance-Leahy Scale: Good     Standing balance support: Bilateral upper extremity supported Standing balance-Leahy Scale: Poor Standing balance comment: Reliant on BUE and external support                             Cognition Arousal/Alertness: Awake/alert Behavior During Therapy: Flat affect Overall Cognitive Status: Within Functional Limits for tasks assessed                                        Exercises General Exercises - Lower Extremity Heel Raises: AROM;10 reps;Standing;Right (using RW and min A) Mini-Sqauts: AROM;Right;10 reps;Standing (with RW and min A ) Amputee Exercises Hip Flexion/Marching: AROM;Both;10 reps;Seated Knee Extension: AROM;Both;10 reps;Seated Chair Push Up: AROM;10 reps;Seated    General Comments        Pertinent Vitals/Pain Pain Assessment: Faces Faces Pain Scale: Hurts little more Pain Location: L residual limb with movement and phantom pain Pain Descriptors / Indicators: Aching Pain Intervention(s): Limited activity within patient's tolerance;Monitored during session;Repositioned    Home Living                      Prior Function            PT Goals (current goals can now  be found in the care plan section) Acute Rehab PT Goals Patient Stated Goal: to go to rehab  PT Goal Formulation: With patient Time For Goal Achievement: 03/16/20 Potential to Achieve Goals: Good Progress towards PT goals: Progressing toward goals    Frequency    Min 3X/week      PT Plan Discharge plan needs to be updated    Co-evaluation              AM-PAC PT "6 Clicks" Mobility   Outcome Measure  Help needed turning from your back to your side while in a flat bed without using bedrails?: A Little Help needed moving from lying on your back to sitting on the side of a flat bed without using bedrails?: A Little Help needed moving to and from a bed to  a chair (including a wheelchair)?: A Little Help needed standing up from a chair using your arms (e.g., wheelchair or bedside chair)?: A Little Help needed to walk in hospital room?: Total Help needed climbing 3-5 steps with a railing? : Total 6 Click Score: 14    End of Session Equipment Utilized During Treatment: Gait belt Activity Tolerance: Patient tolerated treatment well Patient left: in chair;with call bell/phone within reach Nurse Communication: Mobility status PT Visit Diagnosis: Pain;Difficulty in walking, not elsewhere classified (R26.2);Other abnormalities of gait and mobility (R26.89);Unsteadiness on feet (R26.81) Pain - Right/Left: Left Pain - part of body: Leg     Time: 2094-7096 PT Time Calculation (min) (ACUTE ONLY): 19 min  Charges:  $Therapeutic Activity: 8-22 mins                     Lou Miner, DPT  Acute Rehabilitation Services  Pager: [phone] Office: [phone]    Rudean Hitt 03/10/2020, 4:02 PM

## 2020-03-10 NOTE — Progress Notes (Signed)
PROGRESS NOTE  Jessica Dunlap  DOB: [birth year]  PCP: Redmond School, MD TGG:[id number]  DOA: 02/26/2020  LOS: 13 days   Chief Complaint  Patient presents with  . Foot Ulcer   Brief narrative: Jessica Dunlap is a 64 y.o. female with PMH of diabetes mellitus, peripheral neuropathy, hypertension, hypothyroidism, NASH cirrhosis Patient presented to the ED on 02/26/2020 with a left foot ulcer.  She was noted to be in DKA. She was admitted and managed for DKA per protocol with insulin drip, IV fluid and subsequently switched to subcutaneous insulin. Seen by vascular surgery.  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 Currently patient is pending insurance authorization to discharge to SNF.  Subjective: Patient was seen and examined this afternoon. Frustrated for not having insurance authorization for last several days  Assessment/Plan: DKA -Patient was on DKA at presentation. -She was admitted and managed for DKA per protocol with insulin drip, IV fluid and subsequently switched to subcutaneous insulin.  Diabetes mellitus type 2 with peripheral neuropathy -Interestingly she has never required insulin.  Prior to admission, she was on Metformin and glipizide.  Her hemoglobin A1c is also controlled at 6.7.  An infected foot might have pushed her to DKA. -While in the hospital, oral pills are on hold and she is on subcutaneous insulin.   - ultimately, Metformin and glipizide have been resumed.  Off scheduled insulin.  Blood sugar trend as below -Continue sliding scale insulin with Accu-Cheks. 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  BKA stump is bandaged.  No soakage noted. -Completed the course of antibiotics.  On wheelchair now.  Will be evaluated as an outpatient for prosthesis. -SNF discharge pending.  Essential hypertension -Home  meds include metoprolol 50 mg twice daily, Cardizem 120 mg daily, Lasix 20 mg daily, Aldactone 50 mg daily, -Currently blood pressure is better controlled on metoprolol, Cardizem and Lasix.  Aldactone remains on hold.    Coronary artery disease -Continue aspirin.  Not on a statin presumably because of liver cirrhosis  Nonalcoholic liver cirrhosis -Stable.  Hypothyroidism -Continue Synthroid  Acute anemia of blood loss -Likely secondary to surgical blood loss -On admission hemoglobin was 11.4, postsurgically it has been mostly between 8-9.  Repeat hemoglobin today is 9.4.  Ferritin level adequate at 160. Recent Labs    02/26/20 1725 02/29/20 0148 03/01/20 0352 03/01/20 7035 03/01/20 1222 03/02/20 0093 03/03/20 0139 03/08/20 0439 03/10/20 0427  HGB 11.4* 7.8* 8.1* 8.2* 7.4* 7.3* 8.4* 9.2* 9.4*   Recent Labs    03/08/20 1821  FERRITIN 160   Mobility: Limited mobility now because of BKA status.  On wheelchair Code Status:   Code Status: Full Code  Nutritional status: Body mass index is 23.21 kg/m. Nutrition Problem: Increased nutrient needs Etiology: wound healing Signs/Symptoms: estimated needs Diet Order            Diet heart healthy/carb modified Room service appropriate? Yes; Fluid consistency: Thin  Diet effective now                 DVT prophylaxis: enoxaparin (LOVENOX) injection 40 mg Start: 03/02/20 1000 SCD's Start: 03/01/20 1200   Antimicrobials:  Completed the course Fluid: None Consultants: Vascular surgery, signed off Family Communication:  None at bedside  Status is: Inpatient Remains inpatient appropriate because: Pending insurance authorization  Dispo: The patient is from:  Home              Anticipated d/c is to: SNF              Anticipated d/c date is: Whenever bed/insurance authorization is available              Patient currently is medically stable to d/c.    Infusions:  . magnesium sulfate bolus IVPB      Scheduled Meds: .  aspirin EC  81 mg Oral Daily  . carbamazepine  100 mg Oral BID  . diltiazem  120 mg Oral Daily  . docusate sodium  100 mg Oral Daily  . DULoxetine  30 mg Oral QHS  . DULoxetine  60 mg Oral QHS  . enoxaparin (LOVENOX) injection  40 mg Subcutaneous Q24H  . furosemide  20 mg Oral Daily  . glipiZIDE  10 mg Oral Daily  . HYDROmorphone  16 mg Oral BID  . HYDROmorphone  24 mg Oral Daily  . insulin aspart  0-15 Units Subcutaneous TID WC  . insulin aspart  0-5 Units Subcutaneous QHS  . levothyroxine  200 mcg Oral Q0600  . metFORMIN  1,000 mg Oral BID WC  . metoprolol tartrate  50 mg Oral BID  . morphine  30 mg Oral QHS  . multivitamin with minerals  1 tablet Oral Daily  . pantoprazole  40 mg Oral Daily  . polyethylene glycol  17 g Oral Daily  . pregabalin  75 mg Oral Daily  . Ensure Max Protein  11 oz Oral QHS  . traZODone  150 mg Oral QHS    Antimicrobials: Anti-infectives (From admission, onward)   Start     Dose/Rate Route Frequency Ordered Stop   03/01/20 2200  ceFAZolin (ANCEF) IVPB 2g/100 mL premix        2 g 200 mL/hr over 30 Minutes Intravenous Every 8 hours 03/01/20 1159 03/02/20 0512   02/26/20 2220  cefTRIAXone (ROCEPHIN) 2 g in sodium chloride 0.9 % 100 mL IVPB  Status:  Discontinued       "And" Linked Group Details   2 g 200 mL/hr over 30 Minutes Intravenous Every 24 hours 02/26/20 2220 03/01/20 1159   02/26/20 2220  metroNIDAZOLE (FLAGYL) IVPB 500 mg  Status:  Discontinued       "And" Linked Group Details   500 mg 100 mL/hr over 60 Minutes Intravenous Every 8 hours 02/26/20 2220 03/01/20 1159   02/26/20 1715  piperacillin-tazobactam (ZOSYN) IVPB 3.375 g        3.375 g 100 mL/hr over 30 Minutes Intravenous  Once 02/26/20 1701 02/26/20 1809      PRN meds: acetaminophen **OR** acetaminophen, alum & mag hydroxide-simeth, bisacodyl, dextrose, guaiFENesin-dextromethorphan, hydrALAZINE, HYDROmorphone (DILAUDID) injection, labetalol, magnesium sulfate bolus IVPB, metoprolol  tartrate, ondansetron, oxyCODONE-acetaminophen, phenol, polyvinyl alcohol, senna-docusate, sodium phosphate   Objective: Vitals:   03/10/20 0542 03/10/20 1324  BP: 133/61 (!) 148/75  Pulse: 75 80  Resp: 16 16  Temp: 98.1 F (36.7 C) 98.1 F (36.7 C)  SpO2: 100% 97%    Intake/Output Summary (Last 24 hours) at 03/10/2020 1419 Last data filed at 03/10/2020 1300 Gross per 24 hour  Intake 1080 ml  Output --  Net 1080 ml   Filed Weights   02/26/20 1645  Weight: 59.4 kg   Weight change:  Body mass index is 23.21 kg/m.   Physical Exam: General exam: Appears calm and comfortable.  Not in physical distress. Skin: No rashes, lesions or ulcers. HEENT:  Atraumatic, normocephalic, supple neck, no obvious bleeding Lungs: Clear to auscultation bilaterally CVS: Regular rate and rhythm, no murmur GI/Abd soft, nontender, nondistended, bowel sound present CNS: Alert, awake, oriented x3 Psychiatry: Frustrated Extremities: Left BKA status.  Bandaged stump.  Right no pedal edema, calf tenderness  Data Review: I have personally reviewed the laboratory data and studies available.  Recent Labs  Lab 03/08/20 0439 03/10/20 0427  WBC 5.6 6.0  NEUTROABS  --  3.9  HGB 9.2* 9.4*  HCT 28.3* 28.6*  MCV 92.8 91.1  PLT 165 156   Recent Labs  Lab 03/04/20 0300 03/08/20 0439 03/10/20 0427  NA 136 137 137  K 4.3 3.7 4.2  CL 106 100 99  CO2 25 26 27   GLUCOSE 212* 139* 159*  BUN <5* 10 8  CREATININE 0.55 0.60 0.67  CALCIUM 8.3* 9.0 9.2  MG 1.7  --   --   PHOS 3.3  --   --    Signed, Terrilee Croak, MD [facility] Pager: [phone] (Secure Chat preferred). 03/10/2020

## 2020-03-10 NOTE — Plan of Care (Signed)

## 2020-03-11 LAB — GLUCOSE, CAPILLARY
Glucose-Capillary: 163 mg/dL — ABNORMAL HIGH (ref 70–99)
Glucose-Capillary: 198 mg/dL — ABNORMAL HIGH (ref 70–99)
Glucose-Capillary: 184 mg/dL — ABNORMAL HIGH (ref 70–99)
Glucose-Capillary: 124 mg/dL — ABNORMAL HIGH (ref 70–99)

## 2020-03-11 MED ORDER — OXYCODONE-ACETAMINOPHEN 5-325 MG PO TABS: 1.0000 | ORAL_TABLET | ORAL | 0 refills | Status: DC | PRN

## 2020-03-11 MED ORDER — MORPHINE SULFATE ER 30 MG PO TBCR
30.0000 mg | EXTENDED_RELEASE_TABLET | Freq: Every day | ORAL | 0 refills | Status: AC
Start: 2020-03-11 — End: ?

## 2020-03-11 MED ORDER — HYDROMORPHONE HCL 8 MG PO TABS
24.0000 mg | ORAL_TABLET | Freq: Every day | ORAL | 0 refills | Status: AC
Start: 2020-03-12 — End: 2020-03-17

## 2020-03-11 MED ORDER — HYDROXYZINE HCL 10 MG PO TABS
10.0000 mg | ORAL_TABLET | Freq: Once | ORAL | Status: AC
  Administered 2020-03-11: 10 mg via ORAL
  Filled 2020-03-11: qty 1

## 2020-03-11 MED ORDER — HYDROMORPHONE HCL 8 MG PO TABS
16.0000 mg | ORAL_TABLET | Freq: Two times a day (BID) | ORAL | 0 refills | Status: DC
Start: 2020-03-11 — End: 2020-03-13

## 2020-03-11 NOTE — Progress Notes (Signed)
PROGRESS NOTE    Jessica Dunlap   WGN:[id number]  DOB: May 16, 1956  DOA: 02/26/2020 PCP: Redmond School, MD   Brief Narrative:  Jessica Dunlap is a 64 y.o. female with PMH of diabetes mellitus, peripheral neuropathy, hypertension, hypothyroidism, NASH cirrhosis Patient presented to the ED on 02/26/2020 with a left foot ulcer.  She was noted to be in DKA. She was admitted and managed for DKA per protocol with insulin drip, IV fluid and subsequently switched to subcutaneous insulin. Seen by vascular surgery.  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 Currently patient is pending insurance authorization to discharge to SNF.   Subjective: No complaints.     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 and has been stable - now awaiting SNF- per case manager, the SNF she plans to go to does not take admissions over the weekend  Chronic pain - on a number of chronic pain medications including Morphine and Dilaudid- she is also receiving Cymbalta and Lyrica    Hypothyroidism - cont Synthroid    Hypertension - BP stable today- cont current meds and follow -   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      Consultants:   ortho Procedures:   BKA Antimicrobials:  Anti-infectives (From admission, onward)   Start     Dose/Rate Route Frequency Ordered Stop   03/01/20 2200  ceFAZolin (ANCEF) IVPB 2g/100 mL premix        2 g 200 mL/hr over 30 Minutes Intravenous Every 8 hours 03/01/20 1159 03/02/20 0512   02/26/20 2220  cefTRIAXone (ROCEPHIN) 2 g in sodium chloride 0.9 % 100 mL IVPB  Status:  Discontinued       "And" Linked Group Details   2 g 200 mL/hr over 30 Minutes Intravenous Every 24 hours 02/26/20 2220 03/01/20 1159   02/26/20 2220  metroNIDAZOLE (FLAGYL) IVPB 500 mg  Status:  Discontinued       "And" Linked Group Details   500 mg 100 mL/hr over 60 Minutes Intravenous Every 8 hours 02/26/20 2220 03/01/20 1159   02/26/20 1715  piperacillin-tazobactam (ZOSYN) IVPB 3.375 g        3.375 g 100 mL/hr over 30 Minutes Intravenous  Once 02/26/20 1701 02/26/20 1809       Objective: Vitals:   03/10/20 1324 03/10/20 2121 03/10/20 2128 03/11/20 0531  BP: (!) 148/75 (!) 155/63 Marland Kitchen)  155/63 (!) 128/54  Pulse: 80 78 78 71  Resp: 16 18  16   Temp: 98.1 F (36.7 C) 98.3 F (36.8 C)  98.4 F (36.9 C)  TempSrc: Oral Oral  Oral  SpO2: 97% 99%  97%  Weight:      Height:        Intake/Output Summary (Last 24 hours) at 03/11/2020 1343 Last data filed at 03/11/2020 1044 Gross per 24 hour  Intake 420 ml  Output 750 ml  Net -330 ml   Filed Weights   02/26/20 1645  Weight: 59.4 kg    Examination: General exam: Appears comfortable  HEENT: PERRLA, oral mucosa moist, no sclera icterus or thrush Respiratory system: Clear to auscultation. Respiratory effort normal. Cardiovascular system: S1 & S2 heard, RRR.   Gastrointestinal system: Abdomen soft, non-tender, nondistended. Normal bowel sounds. Central nervous system: Alert and oriented. No focal neurological deficits. Extremities: No cyanosis, clubbing or edema- left  BKA- dressing not opened Skin: No rashes or ulcers Psychiatry:  Mood & affect appropriate.     Data Reviewed: I have personally reviewed following labs and imaging studies  CBC: Recent Labs  Lab 03/08/20 0439 03/10/20 0427  WBC 5.6 6.0  NEUTROABS  --  3.9  HGB 9.2* 9.4*  HCT 28.3* 28.6*  MCV 92.8 91.1  PLT 165 829   Basic Metabolic Panel: Recent Labs  Lab 03/08/20 0439 03/10/20 0427  NA 137 137  K 3.7 4.2  CL 100 99  CO2 26 27  GLUCOSE 139* 159*  BUN 10 8  CREATININE 0.60 0.67  CALCIUM 9.0 9.2   GFR: Estimated Creatinine Clearance: 58.8 mL/min (by C-G formula based on SCr of 0.67 mg/dL). Liver Function Tests: No results for input(s): AST, ALT, ALKPHOS, BILITOT, PROT, ALBUMIN in the last 168 hours. No results for input(s): LIPASE, AMYLASE in the last 168 hours. No results for input(s): AMMONIA in the last 168 hours. Coagulation Profile: No results for input(s): INR, PROTIME in the last 168 hours. Cardiac Enzymes: No results for input(s): CKTOTAL, CKMB, CKMBINDEX, TROPONINI in the last 168 hours. BNP (last 3 results) No results for input(s): PROBNP in the last 8760 hours. HbA1C: No results for input(s): HGBA1C in the last 72 hours. CBG: Recent Labs  Lab 03/10/20 1150 03/10/20 1633 03/10/20 2120 03/11/20 0802 03/11/20 1208  GLUCAP 149* 101* 148* 163* 198*   Lipid Profile: No results for input(s): CHOL, HDL, LDLCALC, TRIG, CHOLHDL, LDLDIRECT in the last 72 hours. Thyroid Function Tests: No results for input(s): TSH, T4TOTAL, FREET4, T3FREE, THYROIDAB in the last 72 hours. Anemia Panel: Recent Labs    03/08/20 1821  FERRITIN 160   Urine analysis:    Component Value Date/Time   COLORURINE YELLOW 09/04/2010 1852   APPEARANCEUR CLEAR 09/04/2010 1852   LABSPEC 1.025 09/04/2010 1852   PHURINE 5.0 09/04/2010 1852   GLUCOSEU NEGATIVE 09/04/2010 1852   HGBUR TRACE (A) 09/04/2010 1852   BILIRUBINUR NEGATIVE 09/04/2010 [place]  09/04/2010 1852   PROTEINUR 100 (A) 09/04/2010 1852   UROBILINOGEN 0.2 09/04/2010 1852   NITRITE NEGATIVE 09/04/2010 1852   LEUKOCYTESUR SMALL (A) 09/04/2010 1852   Sepsis Labs: @LABRCNTIP (procalcitonin:4,lacticidven:4) ) Recent Results (from the past 240 hour(s))  SARS Coronavirus 2 by RT PCR (hospital order, performed in [place] hospital lab) Nasopharyngeal Nasopharyngeal Swab     Status: None   Collection Time: 03/09/20 10:40 PM   Specimen: Nasopharyngeal Swab  Result Value Ref Range Status   SARS Coronavirus 2 NEGATIVE NEGATIVE  Final    Comment: (NOTE) SARS-CoV-2 target nucleic acids are NOT DETECTED.  The SARS-CoV-2 RNA is generally detectable in upper and lower respiratory specimens during the acute phase of infection. The lowest concentration of SARS-CoV-2 viral copies this assay can detect is 250 copies / mL. A negative result does not preclude SARS-CoV-2 infection and should not be used as the sole basis for treatment or other patient management decisions.  A negative result may occur with improper specimen collection / handling, submission of specimen other than nasopharyngeal swab, presence of viral mutation(s) within the areas targeted by this assay, and inadequate number of viral copies (<250 copies / mL). A negative result must be combined with clinical observations, patient history, and epidemiological information.  Fact Sheet for Patients:   StrictlyIdeas.no  Fact Sheet for Healthcare Providers: BankingDealers.co.za  This test is not yet approved or  cleared by the [place] FDA and has been authorized for detection and/or diagnosis of SARS-CoV-2 by FDA under an Emergency Use Authorization (EUA).  This EUA will remain in effect (meaning this test can be used) for the duration of the COVID-19 declaration under Section 564(b)(1) of the Act, 21 U.S.C. section 360bbb-3(b)(1), unless the authorization is  terminated or revoked sooner.  Performed at [facility], [place] [address] [place],  [address]          Radiology Studies: No results found.    Scheduled Meds: . aspirin EC  81 mg Oral Daily  . carbamazepine  100 mg Oral BID  . diltiazem  120 mg Oral Daily  . docusate sodium  100 mg Oral Daily  . DULoxetine  30 mg Oral QHS  . DULoxetine  60 mg Oral QHS  . enoxaparin (LOVENOX) injection  40 mg Subcutaneous Q24H  . furosemide  20 mg Oral Daily  . glipiZIDE  10 mg Oral Daily  . HYDROmorphone  16 mg Oral BID  . HYDROmorphone  24 mg Oral Daily  . insulin aspart  0-15 Units Subcutaneous TID WC  . insulin aspart  0-5 Units Subcutaneous QHS  . levothyroxine  200 mcg Oral Q0600  . metFORMIN  1,000 mg Oral BID WC  . metoprolol tartrate  50 mg Oral BID  . morphine  30 mg Oral QHS  . multivitamin with minerals  1 tablet Oral Daily  . pantoprazole  40 mg Oral Daily  . polyethylene glycol  17 g Oral Daily  . pregabalin  75 mg Oral Daily  . Ensure Max Protein  11 oz Oral QHS  . traZODone  150 mg Oral QHS   Continuous Infusions: . magnesium sulfate bolus IVPB       LOS: 14 days      Debbe Odea, MD [facility] Pager: [email or web address] 03/11/2020, 1:43 PM

## 2020-03-11 NOTE — TOC Progression Note (Signed)
Transition of Care [facility]) - Progression Note    Patient Details  Name: AKANSHA WYCHE MRN: [id number] Date of Birth: 09/07/1955  Transition of Care [facility]) CM/SW [place], [place] Phone Number: [phone] 03/11/2020, 9:21 AM  Clinical Narrative:     CSW called [facility] to check to see if authorization was back. CSW confirmed that their admission staff does not work on the weekends therefore would need to follow up on Monday. Staff at [facility] stated that they did not have patient is their system to be admitted today.  TOC team will continue to assist with discharge planning needs.  Expected Discharge Plan: [place] Barriers to Discharge: Continued Medical Work up, Ship broker  Expected Discharge Plan and Services Expected Discharge Plan: [place] In-house Referral: Clinical Social Work Discharge Planning Services: CM Consult Post Acute Care Choice: Museum/gallery conservator, [place] Living arrangements for the past 2 months: Single Family Home                                       Social Determinants of Health (SDOH) Interventions    Readmission Risk Interventions Readmission Risk Prevention Plan 03/03/2020  Post Dischage Appt Not Complete  Appt Comments pt preference is for SNF  Medication Screening Complete  Transportation Screening Complete  Some recent data might be hidden

## 2020-03-11 NOTE — Progress Notes (Signed)
Patient had an unwitnessed fall coming off the commode. Patient was placed on the commode by charge nurse and advised to call when she was done, patient did call to get placed back in bed but patient did not wait for assistance and fell. Nurse tech came in shortly after to assist and found patient on the floor. Patient states that she did not hurt herself and did not want her to tell her nurse.  Nurse made aware and charger nurse was made aware incident.  Safety zone portal documentation done. Dr. Debbe Odea was messaged to make her aware, no response. She was called and no response. Charge nurse Joann made aware.

## 2020-03-12 LAB — BASIC METABOLIC PANEL
Anion gap: 12 (ref 5–15)
BUN: 10 mg/dL (ref 8–23)
CO2: 24 mmol/L (ref 22–32)
Calcium: 9.4 mg/dL (ref 8.9–10.3)
Chloride: 100 mmol/L (ref 98–111)
Creatinine, Ser: 0.65 mg/dL (ref 0.44–1.00)
GFR calc Af Amer: >60 mL/min (ref >60)
GFR calc non Af Amer: >60 mL/min (ref >60)
Glucose, Bld: 172 mg/dL — ABNORMAL HIGH (ref 70–99)
Potassium: 3.9 mmol/L (ref 3.5–5.1)
Sodium: 136 mmol/L (ref 135–145)

## 2020-03-12 LAB — GLUCOSE, CAPILLARY
Glucose-Capillary: 140 mg/dL — ABNORMAL HIGH (ref 70–99)
Glucose-Capillary: 133 mg/dL — ABNORMAL HIGH (ref 70–99)
Glucose-Capillary: 127 mg/dL — ABNORMAL HIGH (ref 70–99)
Glucose-Capillary: 178 mg/dL — ABNORMAL HIGH (ref 70–99)

## 2020-03-12 NOTE — Plan of Care (Signed)

## 2020-03-12 NOTE — Progress Notes (Signed)
Pt refused dressing change to leg at this time. Would want to sleep longer. Will try again later.

## 2020-03-12 NOTE — Progress Notes (Signed)
PROGRESS NOTE    Jessica Dunlap   JIR:[id number]  DOB: February 24, 1956  DOA: 02/26/2020 PCP: Redmond School, MD   Brief Narrative:  Jessica Dunlap is a 64 y.o. female with PMH of diabetes mellitus, peripheral neuropathy, hypertension, hypothyroidism, NASH cirrhosis Patient presented to the ED on 02/26/2020 with a left foot ulcer.  She was noted to be in DKA. She was admitted and managed for DKA per protocol with insulin drip, IV fluid and subsequently switched to subcutaneous insulin. Seen by vascular surgery.  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 Currently patient is pending insurance authorization to discharge to SNF.   Subjective: She has no complaints but is anxiously awaiting to leave the hospital. She states if the SNF will not take her tomorrow, she will go home instead.     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 and has been stable - now awaiting SNF- per case manager, the SNF she plans to go to does not take admissions over the weekend  Chronic pain - on a number of chronic pain medications including Morphine and Dilaudid- she is also receiving Cymbalta and Lyrica    Hypothyroidism - cont Synthroid    Hypertension - BP stable today- cont current meds and follow -   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      Consultants:   ortho Procedures:   BKA Antimicrobials:  Anti-infectives (From admission, onward)   Start     Dose/Rate Route Frequency Ordered Stop   03/01/20 2200  ceFAZolin (ANCEF) IVPB 2g/100 mL premix        2 g 200 mL/hr over 30 Minutes Intravenous Every 8 hours 03/01/20 1159 03/02/20 0512   02/26/20 2220  cefTRIAXone (ROCEPHIN) 2 g in sodium chloride 0.9 % 100 mL IVPB  Status:  Discontinued       "And" Linked Group Details   2 g 200 mL/hr over 30 Minutes Intravenous Every 24 hours 02/26/20 2220 03/01/20 1159   02/26/20 2220  metroNIDAZOLE (FLAGYL) IVPB 500 mg  Status:  Discontinued       "And" Linked Group Details   500 mg 100 mL/hr over 60 Minutes Intravenous Every 8 hours 02/26/20 2220 03/01/20 1159   02/26/20 1715  piperacillin-tazobactam (ZOSYN) IVPB 3.375 g        3.375 g 100 mL/hr over 30 Minutes Intravenous  Once 02/26/20 1701 02/26/20 1809  Objective: Vitals:   03/11/20 0531 03/11/20 1532 03/11/20 2059 03/12/20 0522  BP: (!) 128/54 125/72 (!) 147/64 (!) 145/64  Pulse: 71 82 79 79  Resp: 16 15 18 16   Temp: 98.4 F (36.9 C) (!) 97.4 F (36.3 C) 98.4 F (36.9 C) 98.3 F (36.8 C)  TempSrc: Oral Oral Oral Oral  SpO2: 97% 99% 100% 100%  Weight:      Height:        Intake/Output Summary (Last 24 hours) at 03/12/2020 1200 Last data filed at 03/12/2020 1032 Gross per 24 hour  Intake 790 ml  Output 300 ml  Net 490 ml   Filed Weights   02/26/20 1645  Weight: 59.4 kg    Examination: General exam: Appears comfortable  HEENT: PERRLA, oral mucosa moist, no sclera icterus or thrush Respiratory system: Clear to auscultation. Respiratory effort normal. Cardiovascular system: S1 & S2 heard, RRR.   Gastrointestinal system: Abdomen soft, non-tender,  nondistended. Normal bowel sounds. Central nervous system: Alert and oriented. No focal neurological deficits. Extremities: No cyanosis, clubbing or edema- left BKA- dressing not opened Skin: No rashes or ulcers Psychiatry:  Mood & affect appropriate.     Data Reviewed: I have personally reviewed following labs and imaging studies  CBC: Recent Labs  Lab 03/08/20 0439 03/10/20 0427  WBC 5.6 6.0  NEUTROABS  --  3.9  HGB 9.2* 9.4*  HCT 28.3* 28.6*  MCV 92.8 91.1  PLT 165 831   Basic Metabolic Panel: Recent Labs  Lab 03/08/20 0439 03/10/20 0427 03/12/20 0133  NA 137 137 136  K 3.7 4.2 3.9  CL 100 99 100  CO2 26 27 24   GLUCOSE 139* 159* 172*  BUN 10 8 10   CREATININE 0.60 0.67 0.65  CALCIUM 9.0 9.2 9.4   GFR: Estimated Creatinine Clearance: 58.8 mL/min (by C-G formula based on SCr of 0.65 mg/dL). Liver Function Tests: No results for input(s): AST, ALT, ALKPHOS, BILITOT, PROT, ALBUMIN in the last 168 hours. No results for input(s): LIPASE, AMYLASE in the last 168 hours. No results for input(s): AMMONIA in the last 168 hours. Coagulation Profile: No results for input(s): INR, PROTIME in the last 168 hours. Cardiac Enzymes: No results for input(s): CKTOTAL, CKMB, CKMBINDEX, TROPONINI in the last 168 hours. BNP (last 3 results) No results for input(s): PROBNP in the last 8760 hours. HbA1C: No results for input(s): HGBA1C in the last 72 hours. CBG: Recent Labs  Lab 03/11/20 0802 03/11/20 1208 03/11/20 1656 03/11/20 2103 03/12/20 0751  GLUCAP 163* 198* 184* 124* 140*   Lipid Profile: No results for input(s): CHOL, HDL, LDLCALC, TRIG, CHOLHDL, LDLDIRECT in the last 72 hours. Thyroid Function Tests: No results for input(s): TSH, T4TOTAL, FREET4, T3FREE, THYROIDAB in the last 72 hours. Anemia Panel: No results for input(s): VITAMINB12, FOLATE, FERRITIN, TIBC, IRON, RETICCTPCT in the last 72 hours. Urine analysis:    Component Value Date/Time   COLORURINE YELLOW  09/04/2010 1852   APPEARANCEUR CLEAR 09/04/2010 1852   LABSPEC 1.025 09/04/2010 1852   PHURINE 5.0 09/04/2010 1852   GLUCOSEU NEGATIVE 09/04/2010 1852   HGBUR TRACE (A) 09/04/2010 1852   BILIRUBINUR NEGATIVE 09/04/2010 1852   KETONESUR NEGATIVE 09/04/2010 1852   PROTEINUR 100 (A) 09/04/2010 1852   UROBILINOGEN 0.2 09/04/2010 1852   NITRITE NEGATIVE 09/04/2010 1852   LEUKOCYTESUR SMALL (A) 09/04/2010 1852   Sepsis Labs: @LABRCNTIP (procalcitonin:4,lacticidven:4) ) Recent Results (from the past 240 hour(s))  SARS Coronavirus 2 by RT PCR (hospital order, performed in [place] hospital  lab) Nasopharyngeal Nasopharyngeal Swab     Status: None   Collection Time: 03/09/20 10:40 PM   Specimen: Nasopharyngeal Swab  Result Value Ref Range Status   SARS Coronavirus 2 NEGATIVE NEGATIVE Final    Comment: (NOTE) SARS-CoV-2 target nucleic acids are NOT DETECTED.  The SARS-CoV-2 RNA is generally detectable in upper and lower respiratory specimens during the acute phase of infection. The lowest concentration of SARS-CoV-2 viral copies this assay can detect is 250 copies / mL. A negative result does not preclude SARS-CoV-2 infection and should not be used as the sole basis for treatment or other patient management decisions.  A negative result may occur with improper specimen collection / handling, submission of specimen other than nasopharyngeal swab, presence of viral mutation(s) within the areas targeted by this assay, and inadequate number of viral copies (<250 copies / mL). A negative result must be combined with clinical observations, patient history, and epidemiological information.  Fact Sheet for Patients:   StrictlyIdeas.no  Fact Sheet for Healthcare Providers: BankingDealers.co.za  This test is not yet approved or  cleared by the [place] FDA and has been authorized for detection and/or diagnosis of SARS-CoV-2 by FDA under an  Emergency Use Authorization (EUA).  This EUA will remain in effect (meaning this test can be used) for the duration of the COVID-19 declaration under Section 564(b)(1) of the Act, 21 U.S.C. section 360bbb-3(b)(1), unless the authorization is terminated or revoked sooner.  Performed at [facility], [place] [address], [place], [place] [address]          Radiology Studies: No results found.    Scheduled Meds: . aspirin EC  81 mg Oral Daily  . carbamazepine  100 mg Oral BID  . diltiazem  120 mg Oral Daily  . docusate sodium  100 mg Oral Daily  . DULoxetine  30 mg Oral QHS  . DULoxetine  60 mg Oral QHS  . enoxaparin (LOVENOX) injection  40 mg Subcutaneous Q24H  . furosemide  20 mg Oral Daily  . glipiZIDE  10 mg Oral Daily  . HYDROmorphone  16 mg Oral BID  . HYDROmorphone  24 mg Oral Daily  . insulin aspart  0-15 Units Subcutaneous TID WC  . insulin aspart  0-5 Units Subcutaneous QHS  . levothyroxine  200 mcg Oral Q0600  . metFORMIN  1,000 mg Oral BID WC  . metoprolol tartrate  50 mg Oral BID  . morphine  30 mg Oral QHS  . multivitamin with minerals  1 tablet Oral Daily  . pantoprazole  40 mg Oral Daily  . polyethylene glycol  17 g Oral Daily  . pregabalin  75 mg Oral Daily  . Ensure Max Protein  11 oz Oral QHS  . traZODone  150 mg Oral QHS   Continuous Infusions: . magnesium sulfate bolus IVPB       LOS: 15 days      Debbe Odea, MD [facility] Pager: [email or web address] 03/12/2020, 12:00 PM

## 2020-03-13 LAB — GLUCOSE, CAPILLARY
Glucose-Capillary: 142 mg/dL — ABNORMAL HIGH (ref 70–99)
Glucose-Capillary: 166 mg/dL — ABNORMAL HIGH (ref 70–99)

## 2020-03-13 LAB — SARS CORONAVIRUS 2 BY RT PCR (HOSPITAL ORDER, PERFORMED IN ~~LOC~~ HOSPITAL LAB): SARS Coronavirus 2: NEGATIVE

## 2020-03-13 MED ORDER — PREGABALIN 75 MG PO CAPS: 75.0000 mg | ORAL_CAPSULE | Freq: Every day | ORAL | 0 refills | Status: AC

## 2020-03-13 MED ORDER — HYDROMORPHONE HCL 8 MG PO TABS: 16.0000 mg | ORAL_TABLET | Freq: Two times a day (BID) | ORAL | 0 refills | Status: AC

## 2020-03-13 MED ORDER — OXYCODONE-ACETAMINOPHEN 5-325 MG PO TABS: 1.0000 | ORAL_TABLET | ORAL | 0 refills | Status: AC | PRN

## 2020-03-13 MED ORDER — METFORMIN HCL 1000 MG PO TABS: 1000.0000 mg | ORAL_TABLET | Freq: Two times a day (BID) | ORAL | 1 refills | Status: AC

## 2020-03-13 NOTE — Social Work (Signed)
Clinical Social Worker facilitated patient discharge including contacting patient family and facility to confirm patient discharge plans.  Clinical information faxed to facility and family agreeable with plan.  CSW arranged ambulance transport via [facility] to [facility]) RN to call [phone]  with report prior to discharge.  Clinical Social Worker will sign off for now as social work intervention is no longer needed. Please consult [place] again if new need arises.  Westley Hummer, MSW, LCSW Clinical Social Worker

## 2020-03-13 NOTE — Progress Notes (Signed)
Orthopedic Tech Progress Note Patient Details:  Jessica Dunlap Nov 26, 1955 [id number] Ordered outside vendor brace Patient ID: Jessica Dunlap, female   DOB: [birth year], 64 y.o.   MRN: [id number]   Jessica Dunlap 03/13/2020, 2:03 PM

## 2020-03-13 NOTE — Progress Notes (Signed)
Pt to [facility] via [place], AVS and DC summary sent with [facility].

## 2020-03-13 NOTE — Discharge Summary (Signed)
Physician Discharge Summary  MEGAHN KILLINGS YNW:[id number] DOB: May 03, 1956 DOA: 02/26/2020  PCP: Redmond School, MD  Admit date: 02/26/2020   Discharge date: 03/13/2020  Admitted From:  Home.  Disposition:  Skilled Nursing Facility  Recommendations for Outpatient Follow-up:  1. Follow up with PCP in 1-2 weeks. 2. Please obtain BMP/CBC in one week. 3. Follow up Vascular surgery as scheduled. 4. 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 5. Her Diabetes was well controlled, Hb A1c 6.7  Home Health: None.   Equipment/Devices: Wound care  Discharge Condition: Stable CODE STATUS:Full code Diet recommendation: Heart Healthy   Brief Summary / Hospital course: LIZABETH FELLNER is a 64 y.o.femalewith PMH of diabetes mellitus, peripheral neuropathy, hypertension, hypothyroidism, NASH cirrhosis presented to the ED on8/28/2021with a left foot ulcer. She was noted to be in DKA. She was admitted and managed for DKA per protocol with insulin drip, IV fluids and subsequently switched to subcutaneous insulin. She was seen by vascular surgery. 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 She tolerated procedure well and today is post operative day 13. She is currently doing fine , awaiting placement in the nursing home, insurance authorization approved.  Patient is being transferred to skilled nursing facility for rehab.  Patient will follow up with vascular surgery outpatient.  Patient was resumed back on her home medications for diabetes hypertension and CHF.  She was managed for below problems.  Discharge Diagnoses:  Principal Problem:   DKA, type 2 ([place]) Active Problems:   Uncontrolled type 2 diabetes with neuropathy ([place])   Hypothyroidism   Hypertension   Coronary artery disease   Cirrhosis, non-alcoholic (HCC)   Diabetic foot ulcer ([place])   DKA (diabetic ketoacidoses) ([place])   Diabetic ulcer of foot associated with diabetes mellitus due to underlying condition, limited  to breakdown of skin ([place])   Cellulitis of left lower extremity  DKA, type 2  >> Resolved. - treated with insulin infusion and resolved.  - A1c 6.7 revealing relatively good control- suspect DKA was related to underlying infection - back on home meds (Glipizide and Metformin) and sugars stable. - on SSI while she is here   Infected left foot with osteomyelitis: - she underwent a BKA on 9/1 and has been stable - now awaiting SNF- per case manager, the SNF she plans to go to does not take admissions over the weekend  Chronic pain .  - on a number of chronic pain medications including Morphine and Dilaudid- she is also receiving Cymbalta and Lyrica    Hypothyroidism - cont Synthroid.    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  - follow  Noted to be on Lasix- follow Cr intermittently- may be on this for ascites- I see on ECHO report in chart and no h/o CHF  Discharge Instructions  Discharge Instructions    Call MD for:  difficulty breathing, headache or visual disturbances   Complete by: As directed    Call MD for:  persistant dizziness or light-headedness   Complete by: As directed    Call MD for:  persistant nausea and vomiting   Complete by: As directed    Diet - low sodium heart healthy   Complete by: As directed    Diet Carb Modified   Complete by: As directed    Discharge instructions   Complete by: As directed    Advised to follow  up PCP in one week. Advised to follow up Vascular surgery as scheduled.   Discharge wound care:   Complete by: As directed    Wound care at Nursing home.   Increase activity slowly   Complete by: As directed      Allergies as of 03/13/2020   No Known Allergies     Medication List    STOP taking these medications   amoxicillin-clavulanate 875-125 MG tablet Commonly known as: AUGMENTIN   ibuprofen 200 MG tablet Commonly known as: ADVIL      TAKE these medications   alendronate 70 MG tablet Commonly known as: FOSAMAX Take 70 mg by mouth once a week. Take with a full glass of water on an empty stomach.   anti-nausea solution Take 10 mLs by mouth as needed for nausea or vomiting.   aspirin EC 81 MG tablet Take 81 mg by mouth daily.   carbamazepine 200 MG 12 hr tablet Commonly known as: TEGRETOL XR Take 200 mg by mouth 2 (two) times daily as needed (neuropathy).   diltiazem 120 MG 24 hr capsule Commonly known as: TIAZAC Take 120 mg by mouth daily.   DULoxetine 30 MG capsule Commonly known as: CYMBALTA Take 1 capsule by mouth daily. Take with 60mg  cap   DULoxetine 60 MG capsule Commonly known as: CYMBALTA Take 60 mg by mouth at bedtime. Take with 30mg  cap   furosemide 20 MG tablet Commonly known as: LASIX Take 20 mg by mouth daily.   glipiZIDE 10 MG tablet Commonly known as: GLUCOTROL Take 10 mg by mouth daily.   HYDROmorphone 8 MG tablet Commonly known as: DILAUDID Take 2 tablets (16 mg total) by mouth 2 (two) times daily. What changed: You were already taking a medication with the same name, and this prescription was added. Make sure you understand how and when to take each.   HYDROmorphone 8 MG tablet Commonly known as: DILAUDID Take 3 tablets (24 mg total) by mouth daily for 5 doses. What changed:   how much to take  when to take this  reasons to take this   levothyroxine 200 MCG tablet Commonly known as: SYNTHROID Take 200 mcg by mouth at bedtime.   levothyroxine 25 MCG tablet Commonly known as: SYNTHROID Take 25 mcg by mouth at bedtime. Take one tablet along with 279mcg tablet for total dose of 222mcg.   metFORMIN 1000 MG tablet Commonly known as: GLUCOPHAGE Take 1 tablet (1,000 mg total) by mouth 2 (two) times daily with a meal. What changed:   medication strength  how much to take   metoprolol tartrate 50 MG tablet Commonly known as: LOPRESSOR Take 50 mg by mouth 2 (two) times  daily.   morphine 30 MG 12 hr tablet Commonly known as: MS CONTIN Take 1 tablet (30 mg total) by mouth at bedtime.   oxyCODONE-acetaminophen 5-325 MG tablet Commonly known as: PERCOCET/ROXICET Take 1-2 tablets by mouth every 4 (four) hours as needed for moderate pain.   pregabalin 75 MG capsule Commonly known as: LYRICA Take 1 capsule (75 mg total) by mouth daily.   spironolactone 25 MG tablet Commonly known as: ALDACTONE Take 50 mg by mouth daily.   SYSTANE OP Place 1 drop into both eyes 3 (three) times daily as needed (dry eyes).   traZODone 150 MG tablet Commonly known as: DESYREL Take 150 mg by mouth at bedtime. For sleep   vitamin E 180 MG (400 UNITS) capsule Take 400 Units by mouth 2 (two) times daily.  Discharge Care Instructions  (From admission, onward)         Start     Ordered   03/13/20 0000  Discharge wound care:       Comments: Wound care at Nursing home.   03/13/20 1139          Contact information for follow-up providers    Waynetta Sandy, MD Follow up in 3 week(s).   Specialties: Vascular Surgery, Cardiology Why: sent Contact information: [place] [address] [phone]        [name], MD In 4 weeks.   Specialties: Vascular Surgery, Cardiology Why: Office will call you to arrange your appt (sent) Contact information: [place] [address] [phone]        Redmond School, MD Follow up in 1 week(s).   Specialty: Internal Medicine Contact information: [address] [place] [address] [phone]            Contact information for after-discharge care    Destination    [facility] .   Service: Skilled Nursing Contact information: [address] [place] [phone]                 No Known Allergies  Consultations: Vascular  surgery.  Procedures/Studies: DG Tibia/Fibula Left  Result Date: 02/26/2020 CLINICAL DATA:  Left lower extremity redness. EXAM: LEFT TIBIA AND FIBULA - 2 VIEW COMPARISON:  None. FINDINGS: There is no evidence of fracture or other focal bone lesions. Mild soft tissue swelling is seen along the anterior aspect of the distal left tibia. This is seen on the lateral view. IMPRESSION: Mild soft tissue swelling without evidence of an acute osseous abnormality. Electronically Signed   By: Virgina Norfolk M.D.   On: 02/26/2020 17:33   DG Ankle Complete Left  Result Date: 02/26/2020 CLINICAL DATA:  Left foot ulcer with left tib/fib redness. EXAM: LEFT ANKLE COMPLETE - 3+ VIEW COMPARISON:  None. FINDINGS: There is no evidence of fracture, dislocation, or joint effusion. Mild degenerative changes seen along the dorsal aspect of the mid left foot. Moderate severity soft tissue swelling is noted along the medial aspect of the left ankle. Moderate severity soft tissue swelling of the dorsal aspect of the mid left foot is also noted. IMPRESSION: 1. Moderate severity medial soft tissue swelling without evidence of acute fracture. 2. Mild degenerative changes without evidence of an acute osseous abnormality. Electronically Signed   By: Virgina Norfolk M.D.   On: 02/26/2020 17:35   MR FOOT LEFT W WO CONTRAST  Result Date: 02/27/2020 CLINICAL DATA:  Foot ulcer, question of osteomyelitis EXAM: MRI OF THE LEFT FOREFOOT WITHOUT AND WITH CONTRAST TECHNIQUE: Multiplanar, multisequence MR imaging of the left foot was performed both before and after administration of intravenous contrast. CONTRAST:  31mL GADAVIST GADOBUTROL 1 MMOL/ML IV SOLN COMPARISON:  None. FINDINGS: Bones/Joint/Cartilage There is mildly increased T2 hyperintense signal with enhancement and T1 hypointensity with cortical irregularity seen at the plantar base of the fifth metatarsal. There is also a probable thin incomplete nondisplaced fracture seen at the  fifth metatarsal base with overlying periosteal reaction and mildly increased STIR signal. There is mild joint space loss seen at the tarsal metatarsal joints with subchondral cystic changes. There is scattered T2 hyperintense signal seen throughout the calcaneus and anterior talus without T1 hypointense no large joint effusions are noted. Ligaments The Lisfranc ligaments are intact. Muscles and Tendons Diffusely  increased STIR signal with fatty infiltration is noted throughout the muscles of the forefoot, likely from chronic denervation atrophy. The flexor and extensor tendons appear to be intact. The visualized portion of the plantar fascia is intact. Soft tissues There is a focal area of ulceration seen along the lateral aspect of the midfoot measuring approximately 1 cm in transverse dimension with a peripherally enhancing sinus tract extending to the base of the fifth metatarsal where there is a peripherally enhancing loculated fluid collection which measures approximately 2.3 x 1.3 cm in dimension surrounding the base of the fifth metatarsal. IMPRESSION: Area of ulceration overlying the lateral plantar base of the fifth metatarsal with a enhancing sinus tract and loculated fluid collection, likely consistent with abscess measuring 2.3 x 1.3 cm. Findings suggestive of acute osteomyelitis involving the plantar base of the fifth metatarsal. Probable incomplete nondisplaced stress fracture seen at the base of the fifth metatarsal with overlying periosteal reaction and mild reactive marrow. Electronically Signed   By: Prudencio Pair M.D.   On: 02/27/2020 23:59   US ARTERIAL ABI (SCREENING LOWER EXTREMITY)  Result Date: 02/27/2020 CLINICAL DATA:  Infected diabetic left foot ulcer. Redness with cellulitis. EXAM: NONINVASIVE PHYSIOLOGIC VASCULAR STUDY OF BILATERAL LOWER EXTREMITIES TECHNIQUE: Evaluation of both lower extremities were performed at rest, including calculation of ankle-brachial indices with single  level Doppler, pressure and pulse volume recording. COMPARISON:  09/14/2019 FINDINGS: Right ABI: 1.12, previously 0.94 Left ABI:  1.06, previously 0.95 Right Lower Extremity: Biphasic Doppler waveforms in the right posterior tibial artery and similar to the previous examination. Primarily monophasic waveforms in the right dorsalis pedis artery. Left Lower Extremity: Biphasic Doppler waveforms in left dorsalis pedis artery. Difficult to characterize waveforms in left posterior tibial artery. 1.0-1.4 Normal IMPRESSION: Normal ankle-brachial indices bilaterally. Minimal change from the prior examination. Electronically Signed   By: Markus Daft M.D.   On: 02/27/2020 10:35   DG Foot Complete Left  Result Date: 02/26/2020 CLINICAL DATA:  Left foot ulcer. EXAM: LEFT FOOT - COMPLETE 3+ VIEW COMPARISON:  None. FINDINGS: There is no evidence of an acute fracture or dislocation. Mild degenerative changes are seen along the dorsal aspect of the mid left foot. There is moderate severity soft tissue swelling of the dorsal aspect of the distal left foot. A 1.3 cm x 0.7 cm superficial soft tissue defect is seen along the lateral aspect of the mid left foot. This is adjacent to the base of the fifth left metatarsal. Mild-to-moderate severity focal soft tissue swelling is also seen within this region. IMPRESSION: 1. Soft tissue ulceration along the lateral aspect of the mid left foot with associated soft tissue swelling. 2. No acute fracture or acute osteomyelitis. 3. Mild degenerative changes, as described above. Electronically Signed   By: Virgina Norfolk M.D.   On: 02/26/2020 17:37   DG Toe Great Right  Result Date: 02/26/2020 CLINICAL DATA:  Left foot ulcer. EXAM: RIGHT GREAT TOE COMPARISON:  None. FINDINGS: There is no evidence of an acute fracture or dislocation. A chronic appearing deformity is seen involving the tuft of the distal phalanx of the right great toe. Moderate severity diffuse soft tissue swelling is  noted. IMPRESSION: Soft tissue swelling without evidence of acute osteomyelitis. MRI correlation is recommended if this remains of clinical concern. Electronically Signed   By: Virgina Norfolk M.D.   On: 02/26/2020 17:32    Left below-knee amputation.   Subjective: Patient was seen and examined at bedside.  Overnight events noted.  Patient is  ready to be discharged.   She denies any complaints.  She reports still has mild pain but which is better controlled with the pain medication.  Discharge Exam: Vitals:   03/12/20 1954 03/13/20 0356  BP: 133/68 132/66  Pulse: 83 84  Resp: 17 16  Temp: 99.4 F (37.4 C) 99.1 F (37.3 C)  SpO2: 99% 99%   Vitals:   03/12/20 0522 03/12/20 1342 03/12/20 1954 03/13/20 0356  BP: (!) 145/64 (!) 154/76 133/68 132/66  Pulse: 79 99 83 84  Resp: 16 16 17 16   Temp: 98.3 F (36.8 C) (!) 97.4 F (36.3 C) 99.4 F (37.4 C) 99.1 F (37.3 C)  TempSrc: Oral  Oral Oral  SpO2: 100% 99% 99% 99%  Weight:      Height:        General: Pt is alert, awake, not in acute distress Cardiovascular: RRR, S1/S2 +, no rubs, no gallops Respiratory: CTA bilaterally, no wheezing, no rhonchi Abdominal: Soft, NT, ND, bowel sounds + Extremities: Left BKA in dressing.    The results of significant diagnostics from this hospitalization (including imaging, microbiology, ancillary and laboratory) are listed below for reference.     Microbiology: Recent Results (from the past 240 hour(s))  SARS Coronavirus 2 by RT PCR (hospital order, performed in [facility] hospital lab) Nasopharyngeal Nasopharyngeal Swab     Status: None   Collection Time: 03/09/20 10:40 PM   Specimen: Nasopharyngeal Swab  Result Value Ref Range Status   SARS Coronavirus 2 NEGATIVE NEGATIVE Final    Comment: (NOTE) SARS-CoV-2 target nucleic acids are NOT DETECTED.  The SARS-CoV-2 RNA is generally detectable in upper and lower respiratory specimens during the acute phase of infection. The  lowest concentration of SARS-CoV-2 viral copies this assay can detect is 250 copies / mL. A negative result does not preclude SARS-CoV-2 infection and should not be used as the sole basis for treatment or other patient management decisions.  A negative result may occur with improper specimen collection / handling, submission of specimen other than nasopharyngeal swab, presence of viral mutation(s) within the areas targeted by this assay, and inadequate number of viral copies (<250 copies / mL). A negative result must be combined with clinical observations, patient history, and epidemiological information.  Fact Sheet for Patients:   StrictlyIdeas.no  Fact Sheet for Healthcare Providers: BankingDealers.co.za  This test is not yet approved or  cleared by the [place] FDA and has been authorized for detection and/or diagnosis of SARS-CoV-2 by FDA under an Emergency Use Authorization (EUA).  This EUA will remain in effect (meaning this test can be used) for the duration of the COVID-19 declaration under Section 564(b)(1) of the Act, 21 U.S.C. section 360bbb-3(b)(1), unless the authorization is terminated or revoked sooner.  Performed at [facility], [place] [address] [place], [place] [address]      Labs: BNP (last 3 results) No results for input(s): BNP in the last 8760 hours. Basic Metabolic Panel: Recent Labs  Lab 03/08/20 0439 03/10/20 0427 03/12/20 0133  NA 137 137 136  K 3.7 4.2 3.9  CL 100 99 100  CO2 26 27 24   GLUCOSE 139* 159* 172*  BUN 10 8 10   CREATININE 0.60 0.67 0.65  CALCIUM 9.0 9.2 9.4   Liver Function Tests: No results for input(s): AST, ALT, ALKPHOS, BILITOT, PROT, ALBUMIN in the last 168 hours. No results for input(s): LIPASE, AMYLASE in the last 168 hours. No results for input(s): AMMONIA in the last 168  hours. CBC: Recent Labs  Lab 03/08/20 0439 03/10/20 0427  WBC 5.6 6.0  NEUTROABS  --   3.9  HGB 9.2* 9.4*  HCT 28.3* 28.6*  MCV 92.8 91.1  PLT 165 156   Cardiac Enzymes: No results for input(s): CKTOTAL, CKMB, CKMBINDEX, TROPONINI in the last 168 hours. BNP: Invalid input(s): POCBNP CBG: Recent Labs  Lab 03/12/20 0751 03/12/20 1205 03/12/20 1652 03/12/20 2038 03/13/20 0808  GLUCAP 140* 133* 127* 178* 142*   D-Dimer No results for input(s): DDIMER in the last 72 hours. Hgb A1c No results for input(s): HGBA1C in the last 72 hours. Lipid Profile No results for input(s): CHOL, HDL, LDLCALC, TRIG, CHOLHDL, LDLDIRECT in the last 72 hours. Thyroid function studies No results for input(s): TSH, T4TOTAL, T3FREE, THYROIDAB in the last 72 hours.  Invalid input(s): FREET3 Anemia work up No results for input(s): VITAMINB12, FOLATE, FERRITIN, TIBC, IRON, RETICCTPCT in the last 72 hours. Urinalysis    Component Value Date/Time   COLORURINE YELLOW 09/04/2010 1852   APPEARANCEUR CLEAR 09/04/2010 1852   LABSPEC 1.025 09/04/2010 1852   PHURINE 5.0 09/04/2010 1852   GLUCOSEU NEGATIVE 09/04/2010 1852   HGBUR TRACE (A) 09/04/2010 1852   BILIRUBINUR NEGATIVE 09/04/2010 1852   KETONESUR NEGATIVE 09/04/2010 1852   PROTEINUR 100 (A) 09/04/2010 1852   UROBILINOGEN 0.2 09/04/2010 1852   NITRITE NEGATIVE 09/04/2010 1852   LEUKOCYTESUR SMALL (A) 09/04/2010 1852   Sepsis Labs Invalid input(s): PROCALCITONIN,  WBC,  LACTICIDVEN Microbiology Recent Results (from the past 240 hour(s))  SARS Coronavirus 2 by RT PCR (hospital order, performed in [facility] hospital lab) Nasopharyngeal Nasopharyngeal Swab     Status: None   Collection Time: 03/09/20 10:40 PM   Specimen: Nasopharyngeal Swab  Result Value Ref Range Status   SARS Coronavirus 2 NEGATIVE NEGATIVE Final    Comment: (NOTE) SARS-CoV-2 target nucleic acids are NOT DETECTED.  The SARS-CoV-2 RNA is generally detectable in upper and lower respiratory specimens during the acute phase of infection. The lowest concentration  of SARS-CoV-2 viral copies this assay can detect is 250 copies / mL. A negative result does not preclude SARS-CoV-2 infection and should not be used as the sole basis for treatment or other patient management decisions.  A negative result may occur with improper specimen collection / handling, submission of specimen other than nasopharyngeal swab, presence of viral mutation(s) within the areas targeted by this assay, and inadequate number of viral copies (<250 copies / mL). A negative result must be combined with clinical observations, patient history, and epidemiological information.  Fact Sheet for Patients:   StrictlyIdeas.no  Fact Sheet for Healthcare Providers: BankingDealers.co.za  This test is not yet approved or  cleared by the [place] FDA and has been authorized for detection and/or diagnosis of SARS-CoV-2 by FDA under an Emergency Use Authorization (EUA).  This EUA will remain in effect (meaning this test can be used) for the duration of the COVID-19 declaration under Section 564(b)(1) of the Act, 21 U.S.C. section 360bbb-3(b)(1), unless the authorization is terminated or revoked sooner.  Performed at [facility], [place] [address], [place], [place] [address]      Time coordinating discharge: Over 30 minutes  SIGNED:   Shawna Clamp, MD  [facility] 03/13/2020, 11:41 AM Pager   If 7PM-7AM, please contact night-coverage [email or web address]

## 2020-03-13 NOTE — Progress Notes (Signed)
COVID test negative, will call report to facility.

## 2020-03-13 NOTE — Progress Notes (Signed)
Report given to [name] at facility.

## 2020-03-13 NOTE — Discharge Instructions (Signed)
Advised to follow up PCP in one week. Advised to follow up Vascular surgery as scheduled.

## 2020-03-13 NOTE — Progress Notes (Signed)
Orthopedic Tech Progress Note Patient Details:  Jessica Dunlap [birth year] [id number] Called in order to [facility] for a BKA SHRINKER Patient ID: Jessica Dunlap, female   DOB: [birth year], 64 y.o.   MRN: [id number]   Janit Pagan 03/13/2020, 2:01 PM

## 2020-03-13 NOTE — TOC Transition Note (Addendum)
Transition of Care [facility]) - CM/SW Discharge Note   Patient Details  Name: Jessica Dunlap MRN: [id number] Date of Birth: December 16, 1955  [facility]) CM/SW Contact:  Alexander Mt, LCSW Phone Number: 03/13/2020, 12:39 PM   Clinical Narrative:    1:41pm- Scheduled PTAR for next available dispatch. Pt COVID test resulted negative.   12:39pm- Pt approved for SNF at [facility]  Josem Kaufmann #[id number]. COVID rapid requested, CSW called pt sister Colletta and let them know, pt sister aware of visitation restrictions. PTAR to be scheduled when COVID has resulted. Pt will need signed scripts for controlled meds. MD aware.    Final next level of care: [place] Barriers to Discharge: Barriers Resolved   Patient Goals and CMS Choice Patient states their goals for this hospitalization and ongoing recovery are:: go to [facility] before returning home CMS Medicare.gov Compare Post Acute Care list provided to:: Patient Choice offered to / list presented to : Patient, Sibling  Discharge Placement PASRR number recieved: 03/07/20            Patient chooses bed at: [facility] Patient to be transferred to facility by: [place] Name of family member notified: pt sister Colletta Patient and family notified of of transfer: 03/13/20  Discharge Plan and Services In-house Referral: Clinical Social Work Discharge Planning Services: CM Consult Post Acute Care Choice: [place], [place]           Readmission Risk Interventions Readmission Risk Prevention Plan 03/03/2020  Post Dischage Appt Not Complete  Appt Comments pt preference is for SNF  Medication Screening Complete  Transportation Screening Complete  Some recent data might be hidden

## 2020-03-13 NOTE — Progress Notes (Signed)
Called and updated [name] about pt having just left and that the PA had ordered a BKA shrinker but told me that if she had to leave prior to the shrinker coming, it was OK. Jessica Dunlap will see if their facility can order one.

## 2020-03-16 ENCOUNTER — Ambulatory Visit: Payer: 59 | Attending: Internal Medicine

## 2020-03-16 DIAGNOSIS — Z23 Encounter for immunization: Secondary | ICD-10-CM

## 2020-03-16 NOTE — Progress Notes (Signed)
   Covid-19 Vaccination Clinic  Name:  Jessica Dunlap    MRN: [id number] DOB: 1955/11/23  03/16/2020  [name] was observed post Covid-19 immunization for 15 minutes without incident. She was provided with Vaccine Information Sheet and instruction to access the V-Safe system.   [name] was instructed to call 911 with any severe reactions post vaccine: Marland Kitchen Difficulty breathing  . Swelling of face and throat  . A fast heartbeat  . A bad rash all over body  . Dizziness and weakness   Immunizations Administered    Name Date Dose VIS Date Route   Pfizer COVID-19 Vaccine 03/16/2020  2:27 PM 0.3 mL 08/25/2018 Intramuscular   Manufacturer: [place]   Lot: [id number]   [place]: [id number]

## 2020-03-27 ENCOUNTER — Ambulatory Visit (INDEPENDENT_AMBULATORY_CARE_PROVIDER_SITE_OTHER): Payer: Self-pay | Admitting: Vascular Surgery

## 2020-03-27 ENCOUNTER — Encounter: Payer: Self-pay | Admitting: Vascular Surgery

## 2020-03-27 ENCOUNTER — Other Ambulatory Visit: Payer: Self-pay

## 2020-03-27 VITALS — BP 135/72 | HR 79 | Temp 97.5°F | Resp 12 | Ht 63.0 in | Wt 125.0 lb

## 2020-03-27 DIAGNOSIS — Z89512 Acquired absence of left leg below knee: Secondary | ICD-10-CM

## 2020-03-27 NOTE — Progress Notes (Signed)
Patient name: Jessica Dunlap MRN: [id number] DOB: 1955/10/18 Sex: female  REASON FOR VISIT: Follow-up left below-knee amputation  HPI: Jessica Dunlap is a 64 y.o. female here today for follow-up of left below-knee amputation.  She did well in the hospital and continues to do well with physical therapy.  She has had no difficulty with wound healing  Current Outpatient Medications  Medication Sig Dispense Refill   alendronate (FOSAMAX) 70 MG tablet Take 70 mg by mouth once a week. Take with a full glass of water on an empty stomach.     anti-nausea (EMETROL) solution Take 10 mLs by mouth as needed for nausea or vomiting.     aspirin EC 81 MG tablet Take 81 mg by mouth daily.     carbamazepine (TEGRETOL XR) 200 MG 12 hr tablet Take 200 mg by mouth 2 (two) times daily as needed (neuropathy).      diltiazem (TIAZAC) 120 MG 24 hr capsule Take 120 mg by mouth daily.     DULoxetine (CYMBALTA) 30 MG capsule Take 1 capsule by mouth daily. Take with 60mg  cap     DULoxetine (CYMBALTA) 60 MG capsule Take 60 mg by mouth at bedtime. Take with 30mg  cap     furosemide (LASIX) 20 MG tablet Take 20 mg by mouth daily.     glipiZIDE (GLUCOTROL) 10 MG tablet Take 10 mg by mouth daily.     HYDROmorphone (DILAUDID) 8 MG tablet Take 2 tablets (16 mg total) by mouth 2 (two) times daily. 10 tablet 0   levothyroxine (SYNTHROID, LEVOTHROID) 200 MCG tablet Take 200 mcg by mouth at bedtime.      metFORMIN (GLUCOPHAGE) 1000 MG tablet Take 1 tablet (1,000 mg total) by mouth 2 (two) times daily with a meal. 60 tablet 1   metoprolol (LOPRESSOR) 50 MG tablet Take 50 mg by mouth 2 (two) times daily.     morphine (MS CONTIN) 30 MG 12 hr tablet Take 1 tablet (30 mg total) by mouth at bedtime. 10 tablet 0   oxyCODONE-acetaminophen (PERCOCET/ROXICET) 5-325 MG tablet Take 1-2 tablets by mouth every 4 (four) hours as needed for moderate pain. 10 tablet 0   Polyethyl Glycol-Propyl Glycol  (SYSTANE OP) Place 1 drop into both eyes 3 (three) times daily as needed (dry eyes).     pregabalin (LYRICA) 75 MG capsule Take 1 capsule (75 mg total) by mouth daily. 30 capsule 0   spironolactone (ALDACTONE) 25 MG tablet Take 50 mg by mouth daily.     traZODone (DESYREL) 150 MG tablet Take 150 mg by mouth at bedtime. For sleep     vitamin E 400 UNIT capsule Take 400 Units by mouth 2 (two) times daily.     levothyroxine (SYNTHROID, LEVOTHROID) 25 MCG tablet Take 25 mcg by mouth at bedtime. Take one tablet along with 213mcg tablet for total dose of 257mcg. (Patient not taking: Reported on 02/26/2020)     No current facility-administered medications for this visit.     PHYSICAL EXAM: Vitals:   03/27/20 1319  BP: 135/72  Pulse: 79  Resp: 12  Temp: (!) 97.5 F (36.4 C)  TempSrc: Other (Comment)  SpO2: 98%  Weight: 125 lb (56.7 kg)  Height: 5\' 3"  (1.6 m)    GENERAL: The patient is a well-nourished female, in no acute distress. The vital signs are documented above. Her left below-knee amputation is well-healed.  Staples are removed today  MEDICAL ISSUES: Stable status post left below-knee amputation on 03/01/2020.  Is continue to work with physical therapy.  We will coordinate referral for stump shrinker and prosthetists.  She will see [place] again on an as-needed basis   Jessica Posner, MD [facility] Tel [phone] Pager [phone]

## 2021-04-03 DIAGNOSIS — R69 Illness, unspecified: Secondary | ICD-10-CM | POA: Diagnosis not present

## 2021-04-03 DIAGNOSIS — G3184 Mild cognitive impairment, so stated: Secondary | ICD-10-CM | POA: Diagnosis not present

## 2021-04-03 DIAGNOSIS — K7460 Unspecified cirrhosis of liver: Secondary | ICD-10-CM | POA: Diagnosis not present

## 2021-04-03 DIAGNOSIS — Z89512 Acquired absence of left leg below knee: Secondary | ICD-10-CM | POA: Diagnosis not present

## 2021-04-03 DIAGNOSIS — E039 Hypothyroidism, unspecified: Secondary | ICD-10-CM | POA: Diagnosis not present

## 2021-04-03 DIAGNOSIS — E1142 Type 2 diabetes mellitus with diabetic polyneuropathy: Secondary | ICD-10-CM | POA: Diagnosis not present

## 2021-04-03 DIAGNOSIS — E261 Secondary hyperaldosteronism: Secondary | ICD-10-CM | POA: Diagnosis not present

## 2021-04-03 DIAGNOSIS — I471 Supraventricular tachycardia: Secondary | ICD-10-CM | POA: Diagnosis not present

## 2021-04-03 DIAGNOSIS — E1165 Type 2 diabetes mellitus with hyperglycemia: Secondary | ICD-10-CM | POA: Diagnosis not present

## 2021-04-20 DIAGNOSIS — I739 Peripheral vascular disease, unspecified: Secondary | ICD-10-CM | POA: Diagnosis not present

## 2021-04-20 DIAGNOSIS — B351 Tinea unguium: Secondary | ICD-10-CM | POA: Diagnosis not present

## 2021-04-20 DIAGNOSIS — E1142 Type 2 diabetes mellitus with diabetic polyneuropathy: Secondary | ICD-10-CM | POA: Diagnosis not present

## 2021-06-20 DIAGNOSIS — G894 Chronic pain syndrome: Secondary | ICD-10-CM | POA: Diagnosis not present

## 2021-06-20 DIAGNOSIS — E1140 Type 2 diabetes mellitus with diabetic neuropathy, unspecified: Secondary | ICD-10-CM | POA: Diagnosis not present

## 2021-06-20 DIAGNOSIS — E063 Autoimmune thyroiditis: Secondary | ICD-10-CM | POA: Diagnosis not present

## 2021-08-24 DIAGNOSIS — E1140 Type 2 diabetes mellitus with diabetic neuropathy, unspecified: Secondary | ICD-10-CM | POA: Diagnosis not present

## 2021-08-24 DIAGNOSIS — G4700 Insomnia, unspecified: Secondary | ICD-10-CM | POA: Diagnosis not present

## 2021-08-24 DIAGNOSIS — Z899 Acquired absence of limb, unspecified: Secondary | ICD-10-CM | POA: Diagnosis not present

## 2021-08-24 DIAGNOSIS — G5600 Carpal tunnel syndrome, unspecified upper limb: Secondary | ICD-10-CM | POA: Diagnosis not present

## 2021-09-11 DIAGNOSIS — M79671 Pain in right foot: Secondary | ICD-10-CM | POA: Diagnosis not present

## 2021-09-11 DIAGNOSIS — E1151 Type 2 diabetes mellitus with diabetic peripheral angiopathy without gangrene: Secondary | ICD-10-CM | POA: Diagnosis not present

## 2021-09-11 DIAGNOSIS — M79672 Pain in left foot: Secondary | ICD-10-CM | POA: Diagnosis not present

## 2021-09-11 DIAGNOSIS — M79675 Pain in left toe(s): Secondary | ICD-10-CM | POA: Diagnosis not present

## 2021-09-11 DIAGNOSIS — I739 Peripheral vascular disease, unspecified: Secondary | ICD-10-CM | POA: Diagnosis not present

## 2021-09-11 DIAGNOSIS — M79674 Pain in right toe(s): Secondary | ICD-10-CM | POA: Diagnosis not present

## 2021-11-08 DIAGNOSIS — Z899 Acquired absence of limb, unspecified: Secondary | ICD-10-CM | POA: Diagnosis not present

## 2021-11-23 DIAGNOSIS — R262 Difficulty in walking, not elsewhere classified: Secondary | ICD-10-CM | POA: Diagnosis not present

## 2021-11-23 DIAGNOSIS — M6281 Muscle weakness (generalized): Secondary | ICD-10-CM | POA: Diagnosis not present

## 2021-11-23 DIAGNOSIS — Z4781 Encounter for orthopedic aftercare following surgical amputation: Secondary | ICD-10-CM | POA: Diagnosis not present

## 2021-11-23 DIAGNOSIS — Z89512 Acquired absence of left leg below knee: Secondary | ICD-10-CM | POA: Diagnosis not present

## 2021-11-30 DIAGNOSIS — E063 Autoimmune thyroiditis: Secondary | ICD-10-CM | POA: Diagnosis not present

## 2021-11-30 DIAGNOSIS — I10 Essential (primary) hypertension: Secondary | ICD-10-CM | POA: Diagnosis not present

## 2021-11-30 DIAGNOSIS — Z6823 Body mass index (BMI) 23.0-23.9, adult: Secondary | ICD-10-CM | POA: Diagnosis not present

## 2021-11-30 DIAGNOSIS — E1140 Type 2 diabetes mellitus with diabetic neuropathy, unspecified: Secondary | ICD-10-CM | POA: Diagnosis not present

## 2021-11-30 DIAGNOSIS — Z0001 Encounter for general adult medical examination with abnormal findings: Secondary | ICD-10-CM | POA: Diagnosis not present

## 2021-11-30 DIAGNOSIS — G894 Chronic pain syndrome: Secondary | ICD-10-CM | POA: Diagnosis not present

## 2021-11-30 DIAGNOSIS — Z899 Acquired absence of limb, unspecified: Secondary | ICD-10-CM | POA: Diagnosis not present

## 2021-11-30 DIAGNOSIS — K7460 Unspecified cirrhosis of liver: Secondary | ICD-10-CM | POA: Diagnosis not present

## 2021-11-30 DIAGNOSIS — Z89519 Acquired absence of unspecified leg below knee: Secondary | ICD-10-CM | POA: Diagnosis not present

## 2021-12-19 DIAGNOSIS — Z89512 Acquired absence of left leg below knee: Secondary | ICD-10-CM | POA: Diagnosis not present

## 2021-12-19 DIAGNOSIS — R262 Difficulty in walking, not elsewhere classified: Secondary | ICD-10-CM | POA: Diagnosis not present

## 2021-12-19 DIAGNOSIS — Z4781 Encounter for orthopedic aftercare following surgical amputation: Secondary | ICD-10-CM | POA: Diagnosis not present

## 2021-12-19 DIAGNOSIS — M6281 Muscle weakness (generalized): Secondary | ICD-10-CM | POA: Diagnosis not present

## 2021-12-21 DIAGNOSIS — R262 Difficulty in walking, not elsewhere classified: Secondary | ICD-10-CM | POA: Diagnosis not present

## 2021-12-21 DIAGNOSIS — Z89512 Acquired absence of left leg below knee: Secondary | ICD-10-CM | POA: Diagnosis not present

## 2021-12-21 DIAGNOSIS — M6281 Muscle weakness (generalized): Secondary | ICD-10-CM | POA: Diagnosis not present

## 2021-12-21 DIAGNOSIS — Z4781 Encounter for orthopedic aftercare following surgical amputation: Secondary | ICD-10-CM | POA: Diagnosis not present

## 2021-12-25 DIAGNOSIS — R262 Difficulty in walking, not elsewhere classified: Secondary | ICD-10-CM | POA: Diagnosis not present

## 2021-12-25 DIAGNOSIS — Z89512 Acquired absence of left leg below knee: Secondary | ICD-10-CM | POA: Diagnosis not present

## 2021-12-25 DIAGNOSIS — M6281 Muscle weakness (generalized): Secondary | ICD-10-CM | POA: Diagnosis not present

## 2021-12-25 DIAGNOSIS — Z4781 Encounter for orthopedic aftercare following surgical amputation: Secondary | ICD-10-CM | POA: Diagnosis not present

## 2021-12-27 DIAGNOSIS — Z4781 Encounter for orthopedic aftercare following surgical amputation: Secondary | ICD-10-CM | POA: Diagnosis not present

## 2021-12-27 DIAGNOSIS — R262 Difficulty in walking, not elsewhere classified: Secondary | ICD-10-CM | POA: Diagnosis not present

## 2021-12-27 DIAGNOSIS — Z89512 Acquired absence of left leg below knee: Secondary | ICD-10-CM | POA: Diagnosis not present

## 2021-12-27 DIAGNOSIS — M6281 Muscle weakness (generalized): Secondary | ICD-10-CM | POA: Diagnosis not present

## 2022-01-03 DIAGNOSIS — Z89512 Acquired absence of left leg below knee: Secondary | ICD-10-CM | POA: Diagnosis not present

## 2022-01-03 DIAGNOSIS — Z4781 Encounter for orthopedic aftercare following surgical amputation: Secondary | ICD-10-CM | POA: Diagnosis not present

## 2022-01-03 DIAGNOSIS — R262 Difficulty in walking, not elsewhere classified: Secondary | ICD-10-CM | POA: Diagnosis not present

## 2022-01-03 DIAGNOSIS — M6281 Muscle weakness (generalized): Secondary | ICD-10-CM | POA: Diagnosis not present

## 2022-01-05 DIAGNOSIS — R262 Difficulty in walking, not elsewhere classified: Secondary | ICD-10-CM | POA: Diagnosis not present

## 2022-01-05 DIAGNOSIS — Z4781 Encounter for orthopedic aftercare following surgical amputation: Secondary | ICD-10-CM | POA: Diagnosis not present

## 2022-01-05 DIAGNOSIS — M6281 Muscle weakness (generalized): Secondary | ICD-10-CM | POA: Diagnosis not present

## 2022-01-05 DIAGNOSIS — Z89512 Acquired absence of left leg below knee: Secondary | ICD-10-CM | POA: Diagnosis not present

## 2022-01-08 DIAGNOSIS — M6281 Muscle weakness (generalized): Secondary | ICD-10-CM | POA: Diagnosis not present

## 2022-01-08 DIAGNOSIS — Z89512 Acquired absence of left leg below knee: Secondary | ICD-10-CM | POA: Diagnosis not present

## 2022-01-08 DIAGNOSIS — Z4781 Encounter for orthopedic aftercare following surgical amputation: Secondary | ICD-10-CM | POA: Diagnosis not present

## 2022-01-08 DIAGNOSIS — R262 Difficulty in walking, not elsewhere classified: Secondary | ICD-10-CM | POA: Diagnosis not present

## 2022-01-29 DIAGNOSIS — D649 Anemia, unspecified: Secondary | ICD-10-CM | POA: Diagnosis not present

## 2022-01-29 DIAGNOSIS — Z89519 Acquired absence of unspecified leg below knee: Secondary | ICD-10-CM | POA: Diagnosis not present

## 2022-01-29 DIAGNOSIS — I10 Essential (primary) hypertension: Secondary | ICD-10-CM | POA: Diagnosis not present

## 2022-01-29 DIAGNOSIS — K7460 Unspecified cirrhosis of liver: Secondary | ICD-10-CM | POA: Diagnosis not present

## 2022-01-29 DIAGNOSIS — E11628 Type 2 diabetes mellitus with other skin complications: Secondary | ICD-10-CM | POA: Diagnosis not present

## 2022-01-29 DIAGNOSIS — G894 Chronic pain syndrome: Secondary | ICD-10-CM | POA: Diagnosis not present

## 2022-01-29 DIAGNOSIS — N1831 Chronic kidney disease, stage 3a: Secondary | ICD-10-CM | POA: Diagnosis not present

## 2022-01-29 DIAGNOSIS — E118 Type 2 diabetes mellitus with unspecified complications: Secondary | ICD-10-CM | POA: Diagnosis not present

## 2022-01-29 DIAGNOSIS — Z899 Acquired absence of limb, unspecified: Secondary | ICD-10-CM | POA: Diagnosis not present

## 2022-03-27 DIAGNOSIS — I10 Essential (primary) hypertension: Secondary | ICD-10-CM | POA: Diagnosis not present

## 2022-03-27 DIAGNOSIS — G894 Chronic pain syndrome: Secondary | ICD-10-CM | POA: Diagnosis not present

## 2022-03-27 DIAGNOSIS — Z89519 Acquired absence of unspecified leg below knee: Secondary | ICD-10-CM | POA: Diagnosis not present

## 2022-03-27 DIAGNOSIS — E1140 Type 2 diabetes mellitus with diabetic neuropathy, unspecified: Secondary | ICD-10-CM | POA: Diagnosis not present

## 2022-03-29 DIAGNOSIS — Z89512 Acquired absence of left leg below knee: Secondary | ICD-10-CM | POA: Diagnosis not present

## 2022-03-29 DIAGNOSIS — Z4781 Encounter for orthopedic aftercare following surgical amputation: Secondary | ICD-10-CM | POA: Diagnosis not present

## 2022-03-29 DIAGNOSIS — R262 Difficulty in walking, not elsewhere classified: Secondary | ICD-10-CM | POA: Diagnosis not present

## 2022-03-29 DIAGNOSIS — M6281 Muscle weakness (generalized): Secondary | ICD-10-CM | POA: Diagnosis not present

## 2022-04-05 DIAGNOSIS — M6281 Muscle weakness (generalized): Secondary | ICD-10-CM | POA: Diagnosis not present

## 2022-04-05 DIAGNOSIS — Z89512 Acquired absence of left leg below knee: Secondary | ICD-10-CM | POA: Diagnosis not present

## 2022-04-05 DIAGNOSIS — R262 Difficulty in walking, not elsewhere classified: Secondary | ICD-10-CM | POA: Diagnosis not present

## 2022-04-05 DIAGNOSIS — Z4781 Encounter for orthopedic aftercare following surgical amputation: Secondary | ICD-10-CM | POA: Diagnosis not present

## 2022-04-10 DIAGNOSIS — Z4781 Encounter for orthopedic aftercare following surgical amputation: Secondary | ICD-10-CM | POA: Diagnosis not present

## 2022-04-10 DIAGNOSIS — M6281 Muscle weakness (generalized): Secondary | ICD-10-CM | POA: Diagnosis not present

## 2022-04-10 DIAGNOSIS — R262 Difficulty in walking, not elsewhere classified: Secondary | ICD-10-CM | POA: Diagnosis not present

## 2022-04-10 DIAGNOSIS — Z89512 Acquired absence of left leg below knee: Secondary | ICD-10-CM | POA: Diagnosis not present

## 2022-04-12 DIAGNOSIS — R262 Difficulty in walking, not elsewhere classified: Secondary | ICD-10-CM | POA: Diagnosis not present

## 2022-04-12 DIAGNOSIS — Z4781 Encounter for orthopedic aftercare following surgical amputation: Secondary | ICD-10-CM | POA: Diagnosis not present

## 2022-04-12 DIAGNOSIS — M6281 Muscle weakness (generalized): Secondary | ICD-10-CM | POA: Diagnosis not present

## 2022-04-12 DIAGNOSIS — Z89512 Acquired absence of left leg below knee: Secondary | ICD-10-CM | POA: Diagnosis not present

## 2022-04-19 DIAGNOSIS — D649 Anemia, unspecified: Secondary | ICD-10-CM | POA: Diagnosis not present

## 2022-04-19 DIAGNOSIS — I10 Essential (primary) hypertension: Secondary | ICD-10-CM | POA: Diagnosis not present

## 2022-04-19 DIAGNOSIS — N1831 Chronic kidney disease, stage 3a: Secondary | ICD-10-CM | POA: Diagnosis not present

## 2022-04-19 DIAGNOSIS — Z899 Acquired absence of limb, unspecified: Secondary | ICD-10-CM | POA: Diagnosis not present

## 2022-04-19 DIAGNOSIS — Z89519 Acquired absence of unspecified leg below knee: Secondary | ICD-10-CM | POA: Diagnosis not present

## 2022-04-19 DIAGNOSIS — G894 Chronic pain syndrome: Secondary | ICD-10-CM | POA: Diagnosis not present

## 2022-04-19 DIAGNOSIS — E1140 Type 2 diabetes mellitus with diabetic neuropathy, unspecified: Secondary | ICD-10-CM | POA: Diagnosis not present

## 2022-04-23 DIAGNOSIS — M6281 Muscle weakness (generalized): Secondary | ICD-10-CM | POA: Diagnosis not present

## 2022-04-23 DIAGNOSIS — Z4781 Encounter for orthopedic aftercare following surgical amputation: Secondary | ICD-10-CM | POA: Diagnosis not present

## 2022-04-23 DIAGNOSIS — R262 Difficulty in walking, not elsewhere classified: Secondary | ICD-10-CM | POA: Diagnosis not present

## 2022-04-23 DIAGNOSIS — Z89512 Acquired absence of left leg below knee: Secondary | ICD-10-CM | POA: Diagnosis not present

## 2022-05-01 DIAGNOSIS — E1151 Type 2 diabetes mellitus with diabetic peripheral angiopathy without gangrene: Secondary | ICD-10-CM | POA: Diagnosis not present

## 2022-05-01 DIAGNOSIS — M79675 Pain in left toe(s): Secondary | ICD-10-CM | POA: Diagnosis not present

## 2022-05-01 DIAGNOSIS — I739 Peripheral vascular disease, unspecified: Secondary | ICD-10-CM | POA: Diagnosis not present

## 2022-05-01 DIAGNOSIS — M79671 Pain in right foot: Secondary | ICD-10-CM | POA: Diagnosis not present

## 2022-05-01 DIAGNOSIS — M79672 Pain in left foot: Secondary | ICD-10-CM | POA: Diagnosis not present

## 2022-05-01 DIAGNOSIS — M79674 Pain in right toe(s): Secondary | ICD-10-CM | POA: Diagnosis not present

## 2022-05-06 DIAGNOSIS — R262 Difficulty in walking, not elsewhere classified: Secondary | ICD-10-CM | POA: Diagnosis not present

## 2022-05-06 DIAGNOSIS — M6281 Muscle weakness (generalized): Secondary | ICD-10-CM | POA: Diagnosis not present

## 2022-05-06 DIAGNOSIS — Z4781 Encounter for orthopedic aftercare following surgical amputation: Secondary | ICD-10-CM | POA: Diagnosis not present

## 2022-05-06 DIAGNOSIS — Z89512 Acquired absence of left leg below knee: Secondary | ICD-10-CM | POA: Diagnosis not present

## 2022-05-20 DIAGNOSIS — Z89512 Acquired absence of left leg below knee: Secondary | ICD-10-CM | POA: Diagnosis not present

## 2022-05-20 DIAGNOSIS — Z4781 Encounter for orthopedic aftercare following surgical amputation: Secondary | ICD-10-CM | POA: Diagnosis not present

## 2022-05-20 DIAGNOSIS — R262 Difficulty in walking, not elsewhere classified: Secondary | ICD-10-CM | POA: Diagnosis not present

## 2022-05-20 DIAGNOSIS — M6281 Muscle weakness (generalized): Secondary | ICD-10-CM | POA: Diagnosis not present

## 2022-05-27 DIAGNOSIS — Z4781 Encounter for orthopedic aftercare following surgical amputation: Secondary | ICD-10-CM | POA: Diagnosis not present

## 2022-05-27 DIAGNOSIS — Z89512 Acquired absence of left leg below knee: Secondary | ICD-10-CM | POA: Diagnosis not present

## 2022-05-27 DIAGNOSIS — M6281 Muscle weakness (generalized): Secondary | ICD-10-CM | POA: Diagnosis not present

## 2022-05-27 DIAGNOSIS — R262 Difficulty in walking, not elsewhere classified: Secondary | ICD-10-CM | POA: Diagnosis not present

## 2022-07-09 DIAGNOSIS — E08621 Diabetes mellitus due to underlying condition with foot ulcer: Secondary | ICD-10-CM | POA: Diagnosis not present

## 2022-07-09 DIAGNOSIS — E1165 Type 2 diabetes mellitus with hyperglycemia: Secondary | ICD-10-CM | POA: Diagnosis not present

## 2022-07-09 DIAGNOSIS — Z89519 Acquired absence of unspecified leg below knee: Secondary | ICD-10-CM | POA: Diagnosis not present

## 2022-07-09 DIAGNOSIS — K7460 Unspecified cirrhosis of liver: Secondary | ICD-10-CM | POA: Diagnosis not present

## 2022-08-09 DIAGNOSIS — E1165 Type 2 diabetes mellitus with hyperglycemia: Secondary | ICD-10-CM | POA: Diagnosis not present

## 2022-08-09 DIAGNOSIS — T50905A Adverse effect of unspecified drugs, medicaments and biological substances, initial encounter: Secondary | ICD-10-CM | POA: Diagnosis not present

## 2022-08-09 DIAGNOSIS — K7460 Unspecified cirrhosis of liver: Secondary | ICD-10-CM | POA: Diagnosis not present

## 2022-08-09 DIAGNOSIS — Z899 Acquired absence of limb, unspecified: Secondary | ICD-10-CM | POA: Diagnosis not present

## 2022-08-09 DIAGNOSIS — N1831 Chronic kidney disease, stage 3a: Secondary | ICD-10-CM | POA: Diagnosis not present

## 2022-08-09 DIAGNOSIS — E1140 Type 2 diabetes mellitus with diabetic neuropathy, unspecified: Secondary | ICD-10-CM | POA: Diagnosis not present

## 2022-08-09 DIAGNOSIS — Z89519 Acquired absence of unspecified leg below knee: Secondary | ICD-10-CM | POA: Diagnosis not present

## 2022-08-09 DIAGNOSIS — E11628 Type 2 diabetes mellitus with other skin complications: Secondary | ICD-10-CM | POA: Diagnosis not present

## 2022-08-09 DIAGNOSIS — E08621 Diabetes mellitus due to underlying condition with foot ulcer: Secondary | ICD-10-CM | POA: Diagnosis not present

## 2022-08-21 DIAGNOSIS — I129 Hypertensive chronic kidney disease with stage 1 through stage 4 chronic kidney disease, or unspecified chronic kidney disease: Secondary | ICD-10-CM | POA: Diagnosis not present

## 2022-08-21 DIAGNOSIS — N1831 Chronic kidney disease, stage 3a: Secondary | ICD-10-CM | POA: Diagnosis not present

## 2022-08-21 DIAGNOSIS — E1165 Type 2 diabetes mellitus with hyperglycemia: Secondary | ICD-10-CM | POA: Diagnosis not present

## 2022-08-21 DIAGNOSIS — D631 Anemia in chronic kidney disease: Secondary | ICD-10-CM | POA: Diagnosis not present

## 2022-08-21 DIAGNOSIS — G4700 Insomnia, unspecified: Secondary | ICD-10-CM | POA: Diagnosis not present

## 2022-08-21 DIAGNOSIS — G894 Chronic pain syndrome: Secondary | ICD-10-CM | POA: Diagnosis not present

## 2022-08-21 DIAGNOSIS — F329 Major depressive disorder, single episode, unspecified: Secondary | ICD-10-CM | POA: Diagnosis not present

## 2022-08-21 DIAGNOSIS — E1122 Type 2 diabetes mellitus with diabetic chronic kidney disease: Secondary | ICD-10-CM | POA: Diagnosis not present

## 2022-08-21 DIAGNOSIS — E1140 Type 2 diabetes mellitus with diabetic neuropathy, unspecified: Secondary | ICD-10-CM | POA: Diagnosis not present

## 2022-09-11 DIAGNOSIS — G894 Chronic pain syndrome: Secondary | ICD-10-CM | POA: Diagnosis not present

## 2022-09-11 DIAGNOSIS — N1831 Chronic kidney disease, stage 3a: Secondary | ICD-10-CM | POA: Diagnosis not present

## 2022-09-11 DIAGNOSIS — G4700 Insomnia, unspecified: Secondary | ICD-10-CM | POA: Diagnosis not present

## 2022-09-11 DIAGNOSIS — D631 Anemia in chronic kidney disease: Secondary | ICD-10-CM | POA: Diagnosis not present

## 2022-09-11 DIAGNOSIS — I129 Hypertensive chronic kidney disease with stage 1 through stage 4 chronic kidney disease, or unspecified chronic kidney disease: Secondary | ICD-10-CM | POA: Diagnosis not present

## 2022-09-11 DIAGNOSIS — E1140 Type 2 diabetes mellitus with diabetic neuropathy, unspecified: Secondary | ICD-10-CM | POA: Diagnosis not present

## 2022-09-11 DIAGNOSIS — F329 Major depressive disorder, single episode, unspecified: Secondary | ICD-10-CM | POA: Diagnosis not present

## 2022-09-11 DIAGNOSIS — E1122 Type 2 diabetes mellitus with diabetic chronic kidney disease: Secondary | ICD-10-CM | POA: Diagnosis not present

## 2022-09-11 DIAGNOSIS — E1165 Type 2 diabetes mellitus with hyperglycemia: Secondary | ICD-10-CM | POA: Diagnosis not present

## 2022-09-16 DIAGNOSIS — I129 Hypertensive chronic kidney disease with stage 1 through stage 4 chronic kidney disease, or unspecified chronic kidney disease: Secondary | ICD-10-CM | POA: Diagnosis not present

## 2022-09-16 DIAGNOSIS — E1165 Type 2 diabetes mellitus with hyperglycemia: Secondary | ICD-10-CM | POA: Diagnosis not present

## 2022-09-16 DIAGNOSIS — N1831 Chronic kidney disease, stage 3a: Secondary | ICD-10-CM | POA: Diagnosis not present

## 2022-09-16 DIAGNOSIS — G4700 Insomnia, unspecified: Secondary | ICD-10-CM | POA: Diagnosis not present

## 2022-09-16 DIAGNOSIS — E1140 Type 2 diabetes mellitus with diabetic neuropathy, unspecified: Secondary | ICD-10-CM | POA: Diagnosis not present

## 2022-09-16 DIAGNOSIS — G894 Chronic pain syndrome: Secondary | ICD-10-CM | POA: Diagnosis not present

## 2022-09-16 DIAGNOSIS — F329 Major depressive disorder, single episode, unspecified: Secondary | ICD-10-CM | POA: Diagnosis not present

## 2022-09-16 DIAGNOSIS — E1122 Type 2 diabetes mellitus with diabetic chronic kidney disease: Secondary | ICD-10-CM | POA: Diagnosis not present

## 2022-09-16 DIAGNOSIS — D631 Anemia in chronic kidney disease: Secondary | ICD-10-CM | POA: Diagnosis not present

## 2022-09-20 DIAGNOSIS — G4700 Insomnia, unspecified: Secondary | ICD-10-CM | POA: Diagnosis not present

## 2022-09-20 DIAGNOSIS — E1140 Type 2 diabetes mellitus with diabetic neuropathy, unspecified: Secondary | ICD-10-CM | POA: Diagnosis not present

## 2022-09-20 DIAGNOSIS — I129 Hypertensive chronic kidney disease with stage 1 through stage 4 chronic kidney disease, or unspecified chronic kidney disease: Secondary | ICD-10-CM | POA: Diagnosis not present

## 2022-09-20 DIAGNOSIS — D631 Anemia in chronic kidney disease: Secondary | ICD-10-CM | POA: Diagnosis not present

## 2022-09-20 DIAGNOSIS — E1122 Type 2 diabetes mellitus with diabetic chronic kidney disease: Secondary | ICD-10-CM | POA: Diagnosis not present

## 2022-09-20 DIAGNOSIS — F329 Major depressive disorder, single episode, unspecified: Secondary | ICD-10-CM | POA: Diagnosis not present

## 2022-09-20 DIAGNOSIS — G894 Chronic pain syndrome: Secondary | ICD-10-CM | POA: Diagnosis not present

## 2022-09-20 DIAGNOSIS — E1165 Type 2 diabetes mellitus with hyperglycemia: Secondary | ICD-10-CM | POA: Diagnosis not present

## 2022-09-20 DIAGNOSIS — N1831 Chronic kidney disease, stage 3a: Secondary | ICD-10-CM | POA: Diagnosis not present

## 2022-09-25 DIAGNOSIS — N1831 Chronic kidney disease, stage 3a: Secondary | ICD-10-CM | POA: Diagnosis not present

## 2022-09-25 DIAGNOSIS — E1122 Type 2 diabetes mellitus with diabetic chronic kidney disease: Secondary | ICD-10-CM | POA: Diagnosis not present

## 2022-09-25 DIAGNOSIS — F329 Major depressive disorder, single episode, unspecified: Secondary | ICD-10-CM | POA: Diagnosis not present

## 2022-09-25 DIAGNOSIS — D631 Anemia in chronic kidney disease: Secondary | ICD-10-CM | POA: Diagnosis not present

## 2022-09-25 DIAGNOSIS — E1165 Type 2 diabetes mellitus with hyperglycemia: Secondary | ICD-10-CM | POA: Diagnosis not present

## 2022-09-25 DIAGNOSIS — I129 Hypertensive chronic kidney disease with stage 1 through stage 4 chronic kidney disease, or unspecified chronic kidney disease: Secondary | ICD-10-CM | POA: Diagnosis not present

## 2022-09-25 DIAGNOSIS — G4700 Insomnia, unspecified: Secondary | ICD-10-CM | POA: Diagnosis not present

## 2022-09-25 DIAGNOSIS — G894 Chronic pain syndrome: Secondary | ICD-10-CM | POA: Diagnosis not present

## 2022-09-25 DIAGNOSIS — E1140 Type 2 diabetes mellitus with diabetic neuropathy, unspecified: Secondary | ICD-10-CM | POA: Diagnosis not present

## 2022-10-03 DIAGNOSIS — G894 Chronic pain syndrome: Secondary | ICD-10-CM | POA: Diagnosis not present

## 2022-10-03 DIAGNOSIS — E1122 Type 2 diabetes mellitus with diabetic chronic kidney disease: Secondary | ICD-10-CM | POA: Diagnosis not present

## 2022-10-03 DIAGNOSIS — N1831 Chronic kidney disease, stage 3a: Secondary | ICD-10-CM | POA: Diagnosis not present

## 2022-10-03 DIAGNOSIS — I10 Essential (primary) hypertension: Secondary | ICD-10-CM | POA: Diagnosis not present

## 2022-10-10 DIAGNOSIS — E1165 Type 2 diabetes mellitus with hyperglycemia: Secondary | ICD-10-CM | POA: Diagnosis not present

## 2022-10-10 DIAGNOSIS — E1122 Type 2 diabetes mellitus with diabetic chronic kidney disease: Secondary | ICD-10-CM | POA: Diagnosis not present

## 2022-10-10 DIAGNOSIS — E1140 Type 2 diabetes mellitus with diabetic neuropathy, unspecified: Secondary | ICD-10-CM | POA: Diagnosis not present

## 2022-10-10 DIAGNOSIS — G894 Chronic pain syndrome: Secondary | ICD-10-CM | POA: Diagnosis not present

## 2022-10-10 DIAGNOSIS — F329 Major depressive disorder, single episode, unspecified: Secondary | ICD-10-CM | POA: Diagnosis not present

## 2022-10-10 DIAGNOSIS — D631 Anemia in chronic kidney disease: Secondary | ICD-10-CM | POA: Diagnosis not present

## 2022-10-10 DIAGNOSIS — N1831 Chronic kidney disease, stage 3a: Secondary | ICD-10-CM | POA: Diagnosis not present

## 2022-10-10 DIAGNOSIS — I129 Hypertensive chronic kidney disease with stage 1 through stage 4 chronic kidney disease, or unspecified chronic kidney disease: Secondary | ICD-10-CM | POA: Diagnosis not present

## 2022-10-10 DIAGNOSIS — G4700 Insomnia, unspecified: Secondary | ICD-10-CM | POA: Diagnosis not present

## 2022-10-14 DIAGNOSIS — E1140 Type 2 diabetes mellitus with diabetic neuropathy, unspecified: Secondary | ICD-10-CM | POA: Diagnosis not present

## 2022-10-14 DIAGNOSIS — N1831 Chronic kidney disease, stage 3a: Secondary | ICD-10-CM | POA: Diagnosis not present

## 2022-10-14 DIAGNOSIS — D631 Anemia in chronic kidney disease: Secondary | ICD-10-CM | POA: Diagnosis not present

## 2022-10-14 DIAGNOSIS — E1122 Type 2 diabetes mellitus with diabetic chronic kidney disease: Secondary | ICD-10-CM | POA: Diagnosis not present

## 2022-10-14 DIAGNOSIS — G894 Chronic pain syndrome: Secondary | ICD-10-CM | POA: Diagnosis not present

## 2022-10-14 DIAGNOSIS — G4700 Insomnia, unspecified: Secondary | ICD-10-CM | POA: Diagnosis not present

## 2022-10-14 DIAGNOSIS — I129 Hypertensive chronic kidney disease with stage 1 through stage 4 chronic kidney disease, or unspecified chronic kidney disease: Secondary | ICD-10-CM | POA: Diagnosis not present

## 2022-10-14 DIAGNOSIS — F329 Major depressive disorder, single episode, unspecified: Secondary | ICD-10-CM | POA: Diagnosis not present

## 2022-10-14 DIAGNOSIS — E1165 Type 2 diabetes mellitus with hyperglycemia: Secondary | ICD-10-CM | POA: Diagnosis not present

## 2023-01-20 DIAGNOSIS — G894 Chronic pain syndrome: Secondary | ICD-10-CM | POA: Diagnosis not present

## 2023-01-20 DIAGNOSIS — Z6823 Body mass index (BMI) 23.0-23.9, adult: Secondary | ICD-10-CM | POA: Diagnosis not present

## 2023-01-23 DIAGNOSIS — E1122 Type 2 diabetes mellitus with diabetic chronic kidney disease: Secondary | ICD-10-CM | POA: Diagnosis not present

## 2023-01-23 DIAGNOSIS — G894 Chronic pain syndrome: Secondary | ICD-10-CM | POA: Diagnosis not present

## 2023-01-23 DIAGNOSIS — I129 Hypertensive chronic kidney disease with stage 1 through stage 4 chronic kidney disease, or unspecified chronic kidney disease: Secondary | ICD-10-CM | POA: Diagnosis not present

## 2023-01-23 DIAGNOSIS — I10 Essential (primary) hypertension: Secondary | ICD-10-CM | POA: Diagnosis not present

## 2023-02-19 DIAGNOSIS — E1140 Type 2 diabetes mellitus with diabetic neuropathy, unspecified: Secondary | ICD-10-CM | POA: Diagnosis not present

## 2023-02-19 DIAGNOSIS — K7460 Unspecified cirrhosis of liver: Secondary | ICD-10-CM | POA: Diagnosis not present

## 2023-02-19 DIAGNOSIS — I10 Essential (primary) hypertension: Secondary | ICD-10-CM | POA: Diagnosis not present

## 2023-02-19 DIAGNOSIS — I129 Hypertensive chronic kidney disease with stage 1 through stage 4 chronic kidney disease, or unspecified chronic kidney disease: Secondary | ICD-10-CM | POA: Diagnosis not present

## 2023-02-19 DIAGNOSIS — N1831 Chronic kidney disease, stage 3a: Secondary | ICD-10-CM | POA: Diagnosis not present

## 2023-02-19 DIAGNOSIS — G894 Chronic pain syndrome: Secondary | ICD-10-CM | POA: Diagnosis not present

## 2023-06-10 DIAGNOSIS — E1165 Type 2 diabetes mellitus with hyperglycemia: Secondary | ICD-10-CM | POA: Diagnosis not present

## 2023-06-10 DIAGNOSIS — E1122 Type 2 diabetes mellitus with diabetic chronic kidney disease: Secondary | ICD-10-CM | POA: Diagnosis not present

## 2023-06-10 DIAGNOSIS — E1140 Type 2 diabetes mellitus with diabetic neuropathy, unspecified: Secondary | ICD-10-CM | POA: Diagnosis not present

## 2023-06-10 DIAGNOSIS — K7460 Unspecified cirrhosis of liver: Secondary | ICD-10-CM | POA: Diagnosis not present

## 2023-06-10 DIAGNOSIS — G894 Chronic pain syndrome: Secondary | ICD-10-CM | POA: Diagnosis not present

## 2023-06-10 DIAGNOSIS — N1831 Chronic kidney disease, stage 3a: Secondary | ICD-10-CM | POA: Diagnosis not present

## 2023-06-10 DIAGNOSIS — E11628 Type 2 diabetes mellitus with other skin complications: Secondary | ICD-10-CM | POA: Diagnosis not present

## 2023-06-10 DIAGNOSIS — Z89519 Acquired absence of unspecified leg below knee: Secondary | ICD-10-CM | POA: Diagnosis not present

## 2023-06-10 DIAGNOSIS — T50905A Adverse effect of unspecified drugs, medicaments and biological substances, initial encounter: Secondary | ICD-10-CM | POA: Diagnosis not present

## 2023-07-09 DIAGNOSIS — N1831 Chronic kidney disease, stage 3a: Secondary | ICD-10-CM | POA: Diagnosis not present

## 2023-07-09 DIAGNOSIS — K7460 Unspecified cirrhosis of liver: Secondary | ICD-10-CM | POA: Diagnosis not present

## 2023-07-09 DIAGNOSIS — E1140 Type 2 diabetes mellitus with diabetic neuropathy, unspecified: Secondary | ICD-10-CM | POA: Diagnosis not present

## 2023-07-09 DIAGNOSIS — G894 Chronic pain syndrome: Secondary | ICD-10-CM | POA: Diagnosis not present

## 2023-08-05 DIAGNOSIS — Z89519 Acquired absence of unspecified leg below knee: Secondary | ICD-10-CM | POA: Diagnosis not present

## 2023-08-05 DIAGNOSIS — G894 Chronic pain syndrome: Secondary | ICD-10-CM | POA: Diagnosis not present

## 2023-08-05 DIAGNOSIS — E1140 Type 2 diabetes mellitus with diabetic neuropathy, unspecified: Secondary | ICD-10-CM | POA: Diagnosis not present

## 2023-08-05 DIAGNOSIS — I10 Essential (primary) hypertension: Secondary | ICD-10-CM | POA: Diagnosis not present

## 2023-08-19 DIAGNOSIS — E1140 Type 2 diabetes mellitus with diabetic neuropathy, unspecified: Secondary | ICD-10-CM | POA: Diagnosis not present

## 2023-08-19 DIAGNOSIS — E559 Vitamin D deficiency, unspecified: Secondary | ICD-10-CM | POA: Diagnosis not present

## 2023-08-19 DIAGNOSIS — D518 Other vitamin B12 deficiency anemias: Secondary | ICD-10-CM | POA: Diagnosis not present

## 2023-08-19 DIAGNOSIS — G9332 Myalgic encephalomyelitis/chronic fatigue syndrome: Secondary | ICD-10-CM | POA: Diagnosis not present

## 2023-08-19 DIAGNOSIS — Z0001 Encounter for general adult medical examination with abnormal findings: Secondary | ICD-10-CM | POA: Diagnosis not present

## 2023-09-17 DIAGNOSIS — G894 Chronic pain syndrome: Secondary | ICD-10-CM | POA: Diagnosis not present

## 2023-09-17 DIAGNOSIS — N1831 Chronic kidney disease, stage 3a: Secondary | ICD-10-CM | POA: Diagnosis not present

## 2023-09-17 DIAGNOSIS — E1140 Type 2 diabetes mellitus with diabetic neuropathy, unspecified: Secondary | ICD-10-CM | POA: Diagnosis not present

## 2023-09-17 DIAGNOSIS — E1122 Type 2 diabetes mellitus with diabetic chronic kidney disease: Secondary | ICD-10-CM | POA: Diagnosis not present

## 2023-10-27 DIAGNOSIS — E1140 Type 2 diabetes mellitus with diabetic neuropathy, unspecified: Secondary | ICD-10-CM | POA: Diagnosis not present

## 2023-10-27 DIAGNOSIS — I10 Essential (primary) hypertension: Secondary | ICD-10-CM | POA: Diagnosis not present

## 2023-10-27 DIAGNOSIS — G894 Chronic pain syndrome: Secondary | ICD-10-CM | POA: Diagnosis not present

## 2023-10-30 ENCOUNTER — Encounter: Payer: Self-pay | Admitting: *Deleted

## 2023-11-27 DIAGNOSIS — E1122 Type 2 diabetes mellitus with diabetic chronic kidney disease: Secondary | ICD-10-CM | POA: Diagnosis not present

## 2023-11-27 DIAGNOSIS — I10 Essential (primary) hypertension: Secondary | ICD-10-CM | POA: Diagnosis not present

## 2023-11-27 DIAGNOSIS — Z89519 Acquired absence of unspecified leg below knee: Secondary | ICD-10-CM | POA: Diagnosis not present

## 2023-11-27 DIAGNOSIS — I129 Hypertensive chronic kidney disease with stage 1 through stage 4 chronic kidney disease, or unspecified chronic kidney disease: Secondary | ICD-10-CM | POA: Diagnosis not present

## 2023-11-27 DIAGNOSIS — E1165 Type 2 diabetes mellitus with hyperglycemia: Secondary | ICD-10-CM | POA: Diagnosis not present

## 2023-11-27 DIAGNOSIS — N1831 Chronic kidney disease, stage 3a: Secondary | ICD-10-CM | POA: Diagnosis not present

## 2023-11-27 DIAGNOSIS — K7460 Unspecified cirrhosis of liver: Secondary | ICD-10-CM | POA: Diagnosis not present

## 2023-11-27 DIAGNOSIS — G894 Chronic pain syndrome: Secondary | ICD-10-CM | POA: Diagnosis not present

## 2023-11-27 DIAGNOSIS — E1140 Type 2 diabetes mellitus with diabetic neuropathy, unspecified: Secondary | ICD-10-CM | POA: Diagnosis not present

## 2023-12-09 DIAGNOSIS — L97519 Non-pressure chronic ulcer of other part of right foot with unspecified severity: Secondary | ICD-10-CM | POA: Diagnosis not present

## 2023-12-09 DIAGNOSIS — E11621 Type 2 diabetes mellitus with foot ulcer: Secondary | ICD-10-CM | POA: Diagnosis not present

## 2023-12-09 DIAGNOSIS — M19071 Primary osteoarthritis, right ankle and foot: Secondary | ICD-10-CM | POA: Diagnosis not present

## 2023-12-09 DIAGNOSIS — Z7984 Long term (current) use of oral hypoglycemic drugs: Secondary | ICD-10-CM | POA: Diagnosis not present

## 2023-12-09 DIAGNOSIS — R21 Rash and other nonspecific skin eruption: Secondary | ICD-10-CM | POA: Diagnosis not present

## 2023-12-09 DIAGNOSIS — Z7983 Long term (current) use of bisphosphonates: Secondary | ICD-10-CM | POA: Diagnosis not present

## 2023-12-09 DIAGNOSIS — Z87891 Personal history of nicotine dependence: Secondary | ICD-10-CM | POA: Diagnosis not present

## 2023-12-09 DIAGNOSIS — E1140 Type 2 diabetes mellitus with diabetic neuropathy, unspecified: Secondary | ICD-10-CM | POA: Diagnosis not present

## 2023-12-09 DIAGNOSIS — E1165 Type 2 diabetes mellitus with hyperglycemia: Secondary | ICD-10-CM | POA: Diagnosis not present

## 2023-12-09 DIAGNOSIS — Z89512 Acquired absence of left leg below knee: Secondary | ICD-10-CM | POA: Diagnosis not present

## 2023-12-24 DIAGNOSIS — J309 Allergic rhinitis, unspecified: Secondary | ICD-10-CM | POA: Diagnosis not present

## 2023-12-24 DIAGNOSIS — I10 Essential (primary) hypertension: Secondary | ICD-10-CM | POA: Diagnosis not present

## 2023-12-24 DIAGNOSIS — G894 Chronic pain syndrome: Secondary | ICD-10-CM | POA: Diagnosis not present

## 2023-12-24 DIAGNOSIS — K7460 Unspecified cirrhosis of liver: Secondary | ICD-10-CM | POA: Diagnosis not present

## 2024-02-26 DIAGNOSIS — E1140 Type 2 diabetes mellitus with diabetic neuropathy, unspecified: Secondary | ICD-10-CM | POA: Diagnosis not present

## 2024-02-26 DIAGNOSIS — E1165 Type 2 diabetes mellitus with hyperglycemia: Secondary | ICD-10-CM | POA: Diagnosis not present

## 2024-02-26 DIAGNOSIS — Z899 Acquired absence of limb, unspecified: Secondary | ICD-10-CM | POA: Diagnosis not present

## 2024-02-26 DIAGNOSIS — I10 Essential (primary) hypertension: Secondary | ICD-10-CM | POA: Diagnosis not present

## 2024-02-26 DIAGNOSIS — G894 Chronic pain syndrome: Secondary | ICD-10-CM | POA: Diagnosis not present

## 2024-02-26 DIAGNOSIS — I129 Hypertensive chronic kidney disease with stage 1 through stage 4 chronic kidney disease, or unspecified chronic kidney disease: Secondary | ICD-10-CM | POA: Diagnosis not present

## 2024-02-26 DIAGNOSIS — G4700 Insomnia, unspecified: Secondary | ICD-10-CM | POA: Diagnosis not present

## 2024-02-26 DIAGNOSIS — E1122 Type 2 diabetes mellitus with diabetic chronic kidney disease: Secondary | ICD-10-CM | POA: Diagnosis not present

## 2024-02-26 DIAGNOSIS — Z89519 Acquired absence of unspecified leg below knee: Secondary | ICD-10-CM | POA: Diagnosis not present

## 2024-03-09 DIAGNOSIS — F418 Other specified anxiety disorders: Secondary | ICD-10-CM | POA: Insufficient documentation

## 2024-03-09 DIAGNOSIS — F32A Depression, unspecified: Secondary | ICD-10-CM | POA: Diagnosis not present

## 2024-03-09 DIAGNOSIS — I10 Essential (primary) hypertension: Secondary | ICD-10-CM | POA: Diagnosis present

## 2024-03-09 DIAGNOSIS — F419 Anxiety disorder, unspecified: Secondary | ICD-10-CM | POA: Diagnosis not present

## 2024-03-09 DIAGNOSIS — Z23 Encounter for immunization: Secondary | ICD-10-CM | POA: Diagnosis not present

## 2024-03-09 DIAGNOSIS — Z7689 Persons encountering health services in other specified circumstances: Secondary | ICD-10-CM | POA: Diagnosis not present

## 2024-03-09 DIAGNOSIS — E1165 Type 2 diabetes mellitus with hyperglycemia: Secondary | ICD-10-CM | POA: Diagnosis not present

## 2024-03-09 DIAGNOSIS — E1142 Type 2 diabetes mellitus with diabetic polyneuropathy: Secondary | ICD-10-CM | POA: Diagnosis present

## 2024-03-09 DIAGNOSIS — Z8679 Personal history of other diseases of the circulatory system: Secondary | ICD-10-CM | POA: Diagnosis not present

## 2024-03-09 DIAGNOSIS — G894 Chronic pain syndrome: Secondary | ICD-10-CM | POA: Diagnosis not present

## 2024-03-09 DIAGNOSIS — K7460 Unspecified cirrhosis of liver: Secondary | ICD-10-CM | POA: Diagnosis not present

## 2024-03-18 DIAGNOSIS — R809 Proteinuria, unspecified: Secondary | ICD-10-CM | POA: Insufficient documentation

## 2024-03-18 DIAGNOSIS — E782 Mixed hyperlipidemia: Secondary | ICD-10-CM | POA: Insufficient documentation

## 2024-03-18 DIAGNOSIS — N1831 Chronic kidney disease, stage 3a: Secondary | ICD-10-CM | POA: Insufficient documentation

## 2024-03-18 DIAGNOSIS — D649 Anemia, unspecified: Secondary | ICD-10-CM | POA: Insufficient documentation

## 2024-03-18 DIAGNOSIS — E1129 Type 2 diabetes mellitus with other diabetic kidney complication: Secondary | ICD-10-CM | POA: Insufficient documentation

## 2024-03-18 DIAGNOSIS — D696 Thrombocytopenia, unspecified: Secondary | ICD-10-CM | POA: Insufficient documentation

## 2024-03-19 DIAGNOSIS — I10 Essential (primary) hypertension: Secondary | ICD-10-CM | POA: Diagnosis not present

## 2024-03-19 DIAGNOSIS — Z899 Acquired absence of limb, unspecified: Secondary | ICD-10-CM | POA: Diagnosis not present

## 2024-03-19 DIAGNOSIS — E1165 Type 2 diabetes mellitus with hyperglycemia: Secondary | ICD-10-CM | POA: Diagnosis not present

## 2024-03-19 DIAGNOSIS — F32A Depression, unspecified: Secondary | ICD-10-CM | POA: Diagnosis not present

## 2024-03-19 DIAGNOSIS — G894 Chronic pain syndrome: Secondary | ICD-10-CM | POA: Diagnosis not present

## 2024-03-19 DIAGNOSIS — E039 Hypothyroidism, unspecified: Secondary | ICD-10-CM | POA: Diagnosis not present

## 2024-03-19 DIAGNOSIS — E1142 Type 2 diabetes mellitus with diabetic polyneuropathy: Secondary | ICD-10-CM | POA: Diagnosis not present

## 2024-03-19 DIAGNOSIS — F419 Anxiety disorder, unspecified: Secondary | ICD-10-CM | POA: Diagnosis not present

## 2024-03-19 DIAGNOSIS — K7460 Unspecified cirrhosis of liver: Secondary | ICD-10-CM | POA: Diagnosis not present

## 2024-03-23 ENCOUNTER — Encounter: Payer: Self-pay | Admitting: *Deleted

## 2024-03-23 ENCOUNTER — Other Ambulatory Visit: Payer: Self-pay

## 2024-03-23 ENCOUNTER — Telehealth: Payer: Self-pay | Admitting: *Deleted

## 2024-03-23 NOTE — Patient Outreach (Signed)
 Complex Care Management   Visit Note  04/06/2024 updated note for 03/23/24  Name:  Jessica Dunlap MRN: [id number] DOB: Nov 18, 1955  Situation: Referral received for Complex Care Management related to SDOH Barriers:  Transportation and left BKA , difficulties performing ADLs, hypertension and diabetic peripheral neuropathy I obtained verbal consent from Patient.  Visit completed with Caregiver Patient  on the phone  Today the patient's primary concern is related to a bill related to her pcp office visit with Lauraine Lunger, NP  She is questioning if her [employer] and medicaid coverages have been filed. She discuss [employer] employee benefits   She voiced interest in Personal care services (PCS) renewal. She reports she had PCS only for 20 hours only in the past She is aware of her DSS sitter and can not recall a DSS case worker   She lives with her son who has a PCS aide name Tish since 2022   Diabetic Neuropathy is a chronic symptom but her Diabetes is controlled as her HgA1c = 5.5  She also confirms she experiences hand tremors now for 3 + years  She reports she experience a runny nose When assessed she confirms a history of and lots of dust in the home.  Transportation resources have not been provided by care management's care guide as there were unsuccessful outreaches with no return calls after messages left. She is pending a Scheduled call from the social worker that may address this also She voiced understanding of possible access to [facility] transportation benefit after discussed by RN CM  Background:   Past Medical History:  Diagnosis Date   Anemia    Cancer (HCC)    cervical   Cirrhosis, non-alcoholic (HCC)    Coronary artery disease    Depression    Diabetes mellitus without complication (HCC)    Hypertension    Hypothyroidism    Neuropathy    Tachycardia    Thyroid  disease     Assessment: Patient Reported Symptoms:  Cognitive Cognitive Status: No symptoms reported, Alert  and oriented to person, place, and time, Insightful and able to interpret abstract concepts, Normal speech and language skills Cognitive/Intellectual Conditions Management [RPT]: None reported or documented in medical history or problem list   Health Maintenance Behaviors: Annual physical exam, Healthy diet, Social activities Healing Pattern: Unsure Health Facilitated by: Pain control, Rest  Neurological Neurological Review of Symptoms: Numbness, Other: [place] Neurological Symptoms/Conditions [RPT]: diabetic neuropathy, tingling of extremities Neurological Management Strategies: Medication therapy, Routine screening Neurological Self-Management Outcome: 3 (uncertain)  HEENT HEENT Symptoms Reported: Nasal discharge HEENT Management Strategies: Routine screening HEENT Self-Management Outcome: 3 (uncertain)    Cardiovascular Cardiovascular Symptoms Reported: No symptoms reported Does patient have uncontrolled Hypertension?: No Cardiovascular Management Strategies: Routine screening, Medication therapy Cardiovascular Self-Management Outcome: 4 (good)  Respiratory Respiratory Symptoms Reported: Other: Other Respiratory Symptoms: runny nose dusty home Respiratory Management Strategies: Routine screening Respiratory Self-Management Outcome: 3 (uncertain)  Endocrine Endocrine Symptoms Reported: No symptoms reported Endocrine Self-Management Outcome: 4 (good)  Gastrointestinal Gastrointestinal Symptoms Reported: No symptoms reported Gastrointestinal Self-Management Outcome: 4 (good)    Genitourinary Genitourinary Symptoms Reported: No symptoms reported Genitourinary Self-Management Outcome: 4 (good)  Integumentary Integumentary Symptoms Reported: No symptoms reported Other Integumentary Symptoms: has real dry skin Skin Self-Management Outcome: 4 (good)  Musculoskeletal Musculoskelatal Symptoms Reviewed: Other Other Musculoskeletal Symptoms: diabetic neuropathy hands tremors Musculoskeletal  Management Strategies: Medication therapy, Routine screening Musculoskeletal Self-Management Outcome: 3 (uncertain) Falls in the past year?: No Number of falls in past year: 1  or less Was there an injury with Fall?: No Fall Risk Category Calculator: 0 Patient Fall Risk Level: Low Fall Risk Patient at Risk for Falls Due to: Orthopedic patient, Other (Comment) (amputee) Fall risk Follow up: Falls evaluation completed  Psychosocial       Quality of Family Relationships: helpful, supportive Do you feel physically threatened by others?: No    04/06/2024    PHQ2-9 Depression Screening   Little interest or pleasure in doing things Not at all  Feeling down, depressed, or hopeless Several days  PHQ-2 - Total Score 1  Trouble falling or staying asleep, or sleeping too much    Feeling tired or having little energy    Poor appetite or overeating     Feeling bad about yourself - or that you are a failure or have let yourself or your family down    Trouble concentrating on things, such as reading the newspaper or watching television    Moving or speaking so slowly that other people could have noticed.  Or the opposite - being so fidgety or restless that you have been moving around a lot more than usual    Thoughts that you would be better off dead, or hurting yourself in some way    PHQ2-9 Total Score    If you checked off any problems, how difficult have these problems made it for you to do your work, take care of things at home, or get along with other people    Depression Interventions/Treatment      There were no vitals filed for this visit.  Medications Reviewed Today   Medications were not reviewed in this encounter     Recommendation:   PCP Follow-up Continue Current Plan of Care Pending SW services for [facility] & transportation Pending information from billing department/[employer]  Follow Up Plan:   Telephone follow up appointment date/time:  04/06/24  Suzen L. Ramonita, RN, BSN,  CCM [place]  [facility], [facility] RN Care Manager Direct Dial: [phone]  Fax: [phone]

## 2024-03-25 ENCOUNTER — Telehealth: Payer: Self-pay

## 2024-03-25 DIAGNOSIS — I10 Essential (primary) hypertension: Secondary | ICD-10-CM

## 2024-03-25 DIAGNOSIS — E1142 Type 2 diabetes mellitus with diabetic polyneuropathy: Secondary | ICD-10-CM

## 2024-03-25 NOTE — Progress Notes (Signed)
 Complex Care Management Note  Care Guide Note 03/25/2024 Name: Jessica Dunlap MRN: [id number] DOB: 07/27/1955  Jessica Dunlap is a 68 y.o. year old female who sees Bertell Satterfield, MD for primary care. I reached out to Jessica Dunlap by phone today to offer complex care management services.  Jessica Dunlap was given information about Complex Care Management services today including:   The Complex Care Management services include support from the care team which includes your Nurse Care Manager, Clinical Social Worker, or Pharmacist.  The Complex Care Management team is here to help remove barriers to the health concerns and goals most important to you. Complex Care Management services are voluntary, and the patient may decline or stop services at any time by request to their care team member.   Complex Care Management Consent Status: Patient agreed to services and verbal consent obtained.   Follow up plan:  Telephone appointment with complex care management team member scheduled for:  03/29/24 @ 2 PM  Encounter Outcome:  Patient Scheduled  Jessica Dunlap [facility], [facility] Guide  Direct Dial: [phone]  Fax [phone]

## 2024-03-29 ENCOUNTER — Telehealth: Payer: Self-pay | Admitting: *Deleted

## 2024-03-29 ENCOUNTER — Other Ambulatory Visit: Payer: Self-pay

## 2024-03-29 ENCOUNTER — Telehealth: Payer: Self-pay

## 2024-03-29 NOTE — Patient Instructions (Signed)
 Visit Information  Thank you for taking time to visit with me today. Please don't hesitate to contact me if I can be of assistance to you before our next scheduled appointment.  Our next appointment is by telephone on 04/12/24 at 10am  Please call the care guide team at [phone] if you need to cancel or reschedule your appointment.   Following is a copy of your care plan:   Goals Addressed             This Visit's Progress    BSW VBCI Social Work Care Plan       Problems:   Transportation and Personal Care  CSW Clinical Goal(s):   Over the next 2 weeks the Patient will work with [facility] and Medicaid to address needs related to transportation and personal care.  Interventions:  Social Determinants of Health in Patient with Anxiety, CKD Stage 3, Depression, and DMII: SDOH assessments completed: Transportation and Personal Care Evaluation of current treatment plan related to unmet needs Patient has signed up for [facility] but did not like the thought of riding a van.  T/c [facility] and they do not provide individual rides.  Patient agreed to accept [facility] services and information is updated.  Patient is in a wheelchair and her son's aide assists with some of her ADL's.  SW suggest submitting a request to her provider for [facility] services.  Patient agrees that she needs extra help.  Patient is in a wheelchair and amputee.  Patient has a ramp but needs extra help getting out of the house and into her medical appointments.    Patient Goals/Self-Care Activities:  Patient will contact eye provider to schedule and contact [facility] for transportation.  Patient will await follow up from [facility] on PCS.  Plan:   Telephone follow up appointment with care management team member scheduled for:  04/12/24 at 10am        Please call 911 if you are experiencing a Mental Health or Behavioral Health Crisis or need someone to talk to.  Patient verbalizes understanding of instructions and care plan  provided today and agrees to view in MyChart. Active MyChart status and patient understanding of how to access instructions and care plan via MyChart confirmed with patient.     Tillman Gardener, BSW   [facility], [facility] Social Worker Direct Dial: [phone]  Fax: [phone] Website: [email or web address]

## 2024-03-29 NOTE — Patient Outreach (Signed)
 Complex Care Management   Visit Note  03/29/2024  Name:  Jessica Dunlap MRN: [id number] DOB: 06/09/1956  Situation: Referral received for Complex Care Management related to SDOH Barriers:  Transportation Personal Care Services I obtained verbal consent from Patient.  Visit completed with Patient  on the phone  Background:   Past Medical History:  Diagnosis Date   Anemia    Cancer (HCC)    cervical   Cirrhosis, non-alcoholic (HCC)    Coronary artery disease    Depression    Diabetes mellitus without complication (HCC)    Hypertension    Hypothyroidism    Neuropathy    Tachycardia    Thyroid  disease     Assessment:  Patient has signed up for [facility] but did not like the thought of riding a van. T/c [facility] and they do not provide individual rides. Patient agreed to accept [facility] services and information is updated. Patient is in a wheelchair and her son's aide assists with some of her ADL's. SW suggest submitting a request to her provider for [facility] services. Patient agrees that she needs extra help. Patient is in a wheelchair and amputee. Patient has a ramp but needs extra help getting out of the house and into her medical appointments.   SDOH Interventions    Flowsheet Row Patient Outreach Telephone from 03/29/2024 in [place] POPULATION HEALTH DEPARTMENT Telephone from 03/23/2024 in [place] POPULATION HEALTH DEPARTMENT  SDOH Interventions    Food Insecurity Interventions -- Intervention Not Indicated  Housing Interventions -- Intervention Not Indicated  Transportation Interventions Other (Comment)  [Medicaid, enrolled with [facility]] Other (Comment)  [too nervous to get on [facility]]  Utilities Interventions -- Intervention Not Indicated  Alcohol  Usage Interventions -- Intervention Not Indicated (Score <7)  Financial Strain Interventions -- Intervention Not Indicated  [owe $400 to Dr Hall's]  Stress Interventions -- Intervention Not Indicated  Social Connections Interventions --  Intervention Not Indicated  Health Literacy Interventions -- Intervention Not Indicated    Recommendation:   SW will submit form to provider request PCS   Follow Up Plan:   Telephone follow up appointment date/time:  04/12/24 at 10am  Tillman Gardener, BSW   [facility], [facility] Social Worker Direct Dial: [phone]  Fax: [phone] Website: [email or web address]

## 2024-03-29 NOTE — Progress Notes (Signed)
 Complex Care Management Note Care Guide Note  03/29/2024 Name: Jessica Dunlap MRN: [id number] DOB: June 28, 1956   Complex Care Management Outreach Attempts: An unsuccessful telephone outreach was attempted today to offer the patient information about available complex care management services.  Follow Up Plan:  Additional outreach attempts will be made to offer the patient complex care management information and services.   Encounter Outcome:  No Answer  [facility] Guide  Direct [phone] Fax:[phone] Website: [email or web address]

## 2024-03-30 ENCOUNTER — Telehealth: Payer: Self-pay | Admitting: *Deleted

## 2024-03-30 NOTE — Progress Notes (Signed)
  Complex Care Management Note Care Guide Note  03/30/2024 Name: Jessica Dunlap MRN: [id number] DOB: 1956/05/14   Complex Care Management Outreach Attempts: An unsuccessful telephone outreach was attempted today to offer the patient information about available complex care management services.  Follow Up Plan:  Additional outreach attempts will be made to offer the patient complex care management information and services.   Encounter Outcome:  No Answer  [facility] Care Guide  Direct Dial:[phone] [phone] Website: [email or web address]

## 2024-04-01 ENCOUNTER — Telehealth: Payer: Self-pay | Admitting: *Deleted

## 2024-04-01 NOTE — Progress Notes (Signed)
 Complex Care Management Note Care Guide Note  04/01/2024 Name: ANAYELLI LAI MRN: [id number] DOB: Nov 08, 1955   Complex Care Management Outreach Attempts: A third unsuccessful outreach was attempted today to offer the patient with information about available complex care management services.  Follow Up Plan:  No further outreach attempts will be made at this time. We have been unable to contact the patient to offer or enroll patient in complex care management services.  Encounter Outcome:  No Answer  Number no longer in service   Asencion Gell  [facility]Population Health Care Guide  Direct Dial:[phone] [phone] Website: [email or web address]

## 2024-04-06 ENCOUNTER — Other Ambulatory Visit: Payer: Self-pay | Admitting: *Deleted

## 2024-04-06 NOTE — Patient Instructions (Addendum)
 Visit Information  Thank you for taking time to visit with me today. Please don't hesitate to contact me if I can be of assistance to you before our next scheduled appointment.  Our next appointment is by telephone on 04/06/24 at 1100 Please call the care guide team at [phone] if you need to cancel or reschedule your appointment.   Following is a copy of your care plan:   Goals Addressed             This Visit's Progress    maintain management of Diabetes- VBCI RN Care Plan   No change         Problems:  Chronic Disease Management support and education needs related to DMII   Goal: Over the next 6 months the Patient will attend all scheduled medical appointments: to pcp and any referrals to specialty MDs  as evidenced by documentation in EPIC         demonstrate Ongoing adherence to prescribed treatment plan for DMII as evidenced by HgA1c remaining <7   Interventions:    Diabetes Interventions: Assessed patient's understanding of A1c goal: <7% Provided education to patient about basic DM disease process Counseled on importance of regular laboratory monitoring as prescribed Discussed plans with patient for ongoing care management follow up and provided patient with direct contact information for care management team Review of patient status, including review of consultants reports, relevant laboratory and other test results, and medications completed Screening for signs and symptoms of depression related to chronic disease state  Assessed social determinant of health barriers      Lab Results  Component Value Date    HGBA1C 6.7 (H) 02/27/2020      Patient Self-Care Activities:  Attend all scheduled provider appointments Continue to follow her diabetic low fat, low sodium diet, being aware of hypo & hyperglycemia worsening symptoms Call MD as needed for elevated  or very low glucose values   Plan:  Telephone follow up appointment with care management team member  scheduled for:  04/06/24             personal care services if eligible-VBCI RN Care Plan   No change    03/23/2024   4:13 PM Goal added by Ramonita Suzen CROME, RN  personal care services if eligible-VBCI RN Care Plan     Problems:  Care Coordination needs related to personal care services  Has an autistic son at home, She is amputee, wheelchair bound, no home support/personal care services   Goal: Over the next 6 months the Patient will continue to work with Medical illustrator and/or Social Worker to address care management and care coordination needs related to personal care services as evidenced by adherence to care management team scheduled appointments     + document personal care services (PCS) information documented in EHR   Interventions:    Health Maintenance Interventions: Advised to discuss  hand tremors with primary care provider  Provided education about [employer] medicare DSS case worker,      Evaluation of current treatment plan related to home support services , Limited social support, Literacy concerns, and Lacks knowledge of community resource: for personal care services DSS case worker self-management and patient's adherence to plan as established by provider. Discussed plans with patient for ongoing care management follow up and provided patient with direct contact information for care management team Discussed plans with patient for ongoing care management follow up and provided patient with direct contact information for care management team  Screening for signs and symptoms of depression related to chronic disease state  Assessed social determinant of health barriers   Patient Self-Care Activities:  Attend all scheduled provider appointments Work with RN CM/SW on personal care services    Plan:  Telephone follow up appointment with care management team member scheduled for:  04/06/1129             transportation resources - VBCI RN Care Plan   No change     Problems:  Care Coordination needs related to Transportation  Goal: Over the next 3 months the Patient will collaborate with RN CM & care guide to obtain transportation resources as evidenced by patient attendance to all MD visits/appointments per EPIC documentation   Interventions:   Evaluation of current treatment plan related to transportation to medical appointment, Transportation self-management and patient's adherence to plan as established by provider. Discussed plans with patient for ongoing care management follow up and provided patient with direct contact information for care management team Provided education to patient re: her [employer] medicare, medicaid transportation benefits Collaborated with patient & care guides as needed regarding transportation resources  Patient Self-Care Activities:  Attend all scheduled provider appointments Call RN CM or other VBCI staff if concerns with covered transportation medical services  Plan:  Telephone follow up appointment with care management team member scheduled for:  04/06/24 1130           Treatment for neuropathy and/or hand tremors- VBCI RN Care Plan   No change    Treatment for neuropathy and/or hand tremors- VBCI RN Care Plan     Problems:  Chronic Disease Management support and education needs related to diabetic neuropathy & hand tremors Patient reports she frequently forgot to mention hand tremors to her previous pcp   Goal: Over the next 6 months the Patient will work with RN CM & pcp staff  to find a treatment plan for neuropathy of hands & feet and tremors of her hands  as evidenced by review of electronic medical record and patient or care team member report     Interventions:    Evaluation of current treatment plan related to neuropathy of feet & hands and tremors of hands, home self-management and patient's adherence to plan as established by provider. Discussed plans with patient for ongoing care management follow  up and provided patient with direct contact information for care management team Advised patient to discuss these concerns with her new pcp Provided education to patient re: neuropathy, tremors Reviewed scheduled/upcoming provider appointments including upcoming pcp visit, possible future visit to specialists as needed   Patient Self-Care Activities:  Attend all scheduled provider appointments Call pharmacy for medication refills 3-7 days in advance of running out of medications Call provider office for new concerns or questions  Take medications as prescribed     Plan:  Telephone follow up appointment with care management team member scheduled for:  04/06/24 1130                Please call the Suicide and Crisis Lifeline: 988 call the [place]  National Suicide Prevention Lifeline: (551)054-0381 or TTY: (220) 255-9015 TTY 518-262-0230) to talk to a trained counselor call 1-800-273-TALK (toll free, 24 hour hotline) call the [facility]: 3465069086 call 911 if you are experiencing a Mental Health or Behavioral Health Crisis or need someone to talk to.  Patient verbalizes understanding of instructions and care plan provided today and agrees to view in MyChart. Active MyChart status and patient  understanding of how to access instructions and care plan via MyChart confirmed with patient.     Nixxon Faria L. Ramonita, RN, BSN, CCM [place]  [facility], [facility] Health RN Care Manager Direct Dial: [phone]  Fax: [phone]

## 2024-04-06 NOTE — Patient Outreach (Signed)
 Complex Care Management   Visit Note  04/06/2024  Name:  Jessica Dunlap MRN: [id number] DOB: Jun 15, 1956  Situation: Referral received for Complex Care Management related to SDOH Barriers:  Transportation and left [place] , difficulties performing ADLs, hypertension and diabetic peripheral neuropathy I obtained verbal consent from Patient.  Visit completed with Patient  on the phone   Today the patient denies any other medical issues except  1) concerns with a outstanding bill at pcp office   2) neuropathy pain    She voiced understanding that RN CM spoke with Dr Milford office staff about her outstanding bill to try to identify the reason for the amount of her of bill and of the importance to find all insurance cards for 2025 and to bring them to the MD office for scanning + to use for filing/paying her bill. She voiced understanding of primary and secondary coverages  It appears Jessica Dunlap is listed as primary insurance coverage and [employer] paid partial payments. EPIC and [employer] confirms patient with [employer] (last scanned card in EPIC is for 2019, for [employer] it is for 2024) and medicaid coverage.   Her peripheral neuropathy pain is being addressed by pcp since 03/19/24 Pending pain management referral completion   Background:   Past Medical History:  Diagnosis Date   Anemia    Cancer (HCC)    cervical   Cirrhosis, non-alcoholic (HCC)    Coronary artery disease    Depression    Diabetes mellitus without complication (HCC)    Hypertension    Hypothyroidism    Neuropathy    Tachycardia    Thyroid  disease     Assessment: Patient Reported Symptoms:  Cognitive Cognitive Status: No symptoms reported, Alert and oriented to person, place, and time, Insightful and able to interpret abstract concepts, Normal speech and language skills Cognitive/Intellectual Conditions Management [RPT]: None reported or documented in medical history or problem list   Health Maintenance Behaviors: Annual  physical exam, Healthy diet, Social activities Healing Pattern: Unsure Health Facilitated by: Pain control, Rest, Healthy diet  Neurological Neurological Review of Symptoms: Numbness, Other: [place] Neurological Symptoms/Conditions [RPT]: tingling Neurological Management Strategies: Medication therapy, Routine screening  HEENT HEENT Symptoms Reported: No symptoms reported HEENT Self-Management Outcome: 4 (good)    Cardiovascular Cardiovascular Symptoms Reported: No symptoms reported Cardiovascular Self-Management Outcome: 4 (good)  Respiratory Respiratory Symptoms Reported: No symptoms reported Respiratory Self-Management Outcome: 4 (good)  Endocrine Endocrine Symptoms Reported: No symptoms reported Is patient diabetic?: Yes Is patient checking blood sugars at home?: No Endocrine Self-Management Outcome: 4 (good) Endocrine Comment: hgbA1c5.5 on 03/19/24  Gastrointestinal Gastrointestinal Symptoms Reported: No symptoms reported Gastrointestinal Self-Management Outcome: 4 (good)    Genitourinary Genitourinary Symptoms Reported: No symptoms reported Genitourinary Self-Management Outcome: 4 (good)  Integumentary Integumentary Symptoms Reported: No symptoms reported Other Integumentary Symptoms: dry skin, bruising healed Skin Management Strategies: Routine screening Skin Self-Management Outcome: 4 (good)  Musculoskeletal Musculoskelatal Symptoms Reviewed: Other Other Musculoskeletal Symptoms: neuropathy        Psychosocial Psychosocial Symptoms Reported: No symptoms reported Behavioral Health Self-Management Outcome: 4 (good)        04/06/2024    PHQ2-9 Depression Screening   Little interest or pleasure in doing things Not at all  Feeling down, depressed, or hopeless Several days  PHQ-2 - Total Score 1  Trouble falling or staying asleep, or sleeping too much    Feeling tired or having little energy    Poor appetite or overeating     Feeling bad about yourself -  or that you are a  failure or have let yourself or your family down    Trouble concentrating on things, such as reading the newspaper or watching television    Moving or speaking so slowly that other people could have noticed.  Or the opposite - being so fidgety or restless that you have been moving around a lot more than usual    Thoughts that you would be better off dead, or hurting yourself in some way    PHQ2-9 Total Score    If you checked off any problems, how difficult have these problems made it for you to do your work, take care of things at home, or get along with other people    Depression Interventions/Treatment      There were no vitals filed for this visit.  Medications Reviewed Today     Reviewed by Ramonita Suzen CROME, RN (Registered Nurse) on 04/06/24 at 1330  Med List Status: <None>   Medication Order Taking? Sig Documenting Provider Last Dose Status Informant  alendronate (FOSAMAX) 70 MG tablet [id number]  Take 70 mg by mouth once a week. Take with a full glass of water  on an empty stomach. [provider]  Active Self  anti-nausea (EMETROL) solution [id number] Yes Take 10 mLs by mouth as needed for nausea or vomiting. [provider]  Active Self  aspirin  EC 81 MG tablet [id number] Yes Take 81 mg by mouth daily. [provider]  Active Self  azithromycin (ZITHROMAX) 250 MG tablet [id number]  Take by mouth.  Patient not taking: Reported on 04/06/2024   [provider]  Active   Biotin (SUPER BIOTIN) 5 MG TABS [id number]  Take 1 tablet by mouth. [provider]  Active   carbamazepine  (TEGRETOL  XR) 200 MG 12 hr tablet [id number] Yes Take 200 mg by mouth 2 (two) times daily as needed (neuropathy).  [provider]  Active Self  diltiazem  (DILACOR XR ) 120 MG 24 hr capsule [id number] Yes Take 120 mg by mouth daily. [provider]  Active   diltiazem  (TIAZAC ) 120 MG 24 hr capsule [id number]  Take 120 mg by mouth daily.  Patient not taking:  Reported on 04/06/2024   [provider]  Consider Medication Status and Discontinue (Change in therapy) Self  doxycycline  (VIBRAMYCIN ) 100 MG capsule [id number]  Take 100 mg by mouth 2 (two) times daily.  Patient not taking: Reported on 04/06/2024   [provider]  Consider Medication Status and Discontinue (Completed Course)   DULoxetine  (CYMBALTA ) 30 MG capsule [id number]  Take 1 capsule by mouth daily. Take with 60mg  cap  Patient not taking: Reported on 04/06/2024   [provider]  Active Self  DULoxetine  (CYMBALTA ) 60 MG capsule [id number] Yes Take 60 mg by mouth at bedtime. Take with 30mg  cap [provider]  Active Self  DULoxetine  (CYMBALTA ) 60 MG capsule [id number] Yes Take 120 mg by mouth daily. [provider]  Active   furosemide  (LASIX ) 20 MG tablet [id number]  Take 20 mg by mouth daily.  Patient not taking: Reported on 04/06/2024   [provider]  Consider Medication Status and Discontinue (Duplicate) Self  furosemide  (LASIX ) 20 MG tablet [id number]  Take 20 mg by mouth daily. [provider]  Active   glipiZIDE  (GLUCOTROL ) 10 MG tablet [id number]  Take 10 mg by mouth daily.  Patient not taking: Reported on 04/06/2024   [provider]  Consider Medication Status and Discontinue (Duplicate) Self  glipiZIDE  (GLUCOTROL ) 10 MG  tablet [id number]  Take 10 mg by mouth daily. [provider]  Active   hydrocortisone cream 1 % [id number]  Apply topically. [provider]  Active   HYDROmorphone  (DILAUDID ) 8 MG tablet [id number] Yes Take 2 tablets (16 mg total) by mouth 2 (two) times daily. Leotis Bogus, MD  Active   levothyroxine  (SYNTHROID ) 175 MCG tablet [id number] Yes Take 175 mcg by mouth daily. [provider]  Active   levothyroxine  (SYNTHROID , LEVOTHROID) 200 MCG tablet [id number]  Take 200 mcg by mouth at bedtime.  [provider]  Consider Medication Status and Discontinue (Duplicate)  Self  levothyroxine  (SYNTHROID , LEVOTHROID) 25 MCG tablet [id number]  Take 25 mcg by mouth at bedtime. Take one tablet along with 200mcg tablet for total dose of 225mcg.  Patient not taking: Reported on 02/26/2020   [provider]  Consider Medication Status and Discontinue (Duplicate) Self  metFORMIN  (GLUCOPHAGE ) 1000 MG tablet [id number]  Take 1 tablet (1,000 mg total) by mouth 2 (two) times daily with a meal. Leotis Bogus, MD  Active   metFORMIN  (GLUCOPHAGE ) 500 MG tablet [id number] Yes TAKE FIVE TABLETS BY MOUTH EVERY DAY [provider]  Active   metoprolol  (LOPRESSOR ) 50 MG tablet [id number]  Take 50 mg by mouth 2 (two) times daily.  Patient not taking: Reported on 04/06/2024   [provider]  Consider Medication Status and Discontinue (Duplicate) Self  metoprolol  tartrate (LOPRESSOR ) 50 MG tablet [id number] Yes Take 50 mg by mouth 2 (two) times daily. [provider]  Active   montelukast (SINGULAIR) 10 MG tablet [id number]  Take 10 mg by mouth at bedtime. [provider]  Active   morphine  (MS CONTIN ) 30 MG 12 hr tablet [id number]  Take 1 tablet (30 mg total) by mouth at bedtime. Rizwan, Saima, MD  Active   oxyCODONE -acetaminophen  (PERCOCET/ROXICET) 5-325 MG tablet [id number]  Take 1-2 tablets by mouth every 4 (four) hours as needed for moderate pain.  Patient not taking: Reported on 04/06/2024   Leotis Bogus, MD  Active   pantoprazole  (PROTONIX ) 40 MG tablet [id number] Yes Take 40 mg by mouth 2 (two) times daily. [provider]  Active   Polyethyl Glycol-Propyl Glycol (SYSTANE OP) [id number] Yes Place 1 drop into both eyes 3 (three) times daily as needed (dry eyes). [provider]  Active Self  pregabalin  (LYRICA ) 75 MG capsule [id number]  Take 1 capsule (75 mg total) by mouth daily. Leotis Bogus, MD  Active   spironolactone  (ALDACTONE ) 25 MG tablet [id number]  Take 50 mg by mouth daily.  Patient not taking: Reported on 04/06/2024    [provider]  Consider Medication Status and Discontinue (Duplicate) Self  spironolactone  (ALDACTONE ) 25 MG tablet [id number] Yes Take 50 mg by mouth daily. [provider]  Active   traZODone  (DESYREL ) 150 MG tablet [id number]  Take 150 mg by mouth at bedtime. For sleep  Patient not taking: Reported on 04/06/2024   [provider]  Consider Medication Status and Discontinue (Duplicate) Self  traZODone  (DESYREL ) 150 MG tablet [id number]  Take 150 mg by mouth at bedtime.  Patient not taking: Reported on 04/06/2024   [provider]  Consider Medication Status and Discontinue (Duplicate)   trazodone  (DESYREL ) 300 MG tablet [id number] Yes Take 1 tablet every day by oral route at bedtime for 30 days. [provider]  Active   vitamin E 400 UNIT capsule [id number] Yes Take 400 Units by mouth 2 (two) times daily. [provider]  Active Self  Recommendation:   PCP Follow-up Continue Current Plan of Care Clarify with [employer] the denial of payment for service date 03/19/24 Pending pain management services from [facility] referral ordered on 03/19/24  Follow Up Plan:   Telephone follow up appointment date/time:  with Hendricks Her, RN CM 04/20/24 2 pm   Suzen L. Ramonita, RN, BSN, CCM [place]  [facility], [facility] Health RN Care Manager Direct Dial: [phone]  Fax: [phone]

## 2024-04-06 NOTE — Patient Instructions (Signed)
 Visit Information  Thank you for taking time to visit with me today. Please don't hesitate to contact me if I can be of assistance to you before our next scheduled appointment.  Your next care management appointment is by telephone on 04/20/24  at 2 pm  Please remember to bring any new insurance cards to Dr Milford office & call RN CM if assistance is needed to get clarity from [facility] as why the bill was partially paid in September 2025   Please call the care guide team at [phone] if you need to cancel, schedule, or reschedule an appointment.   Please call the Suicide and Crisis Lifeline: 988 call the [place]  National Suicide Prevention Lifeline: 249 529 2654 or TTY: 351-105-4685 TTY 913-758-1746) to talk to a trained counselor call 1-800-273-TALK (toll free, 24 hour hotline) call the [facility]: 430 247 3731 call 911 if you are experiencing a Mental Health or Behavioral Health Crisis or need someone to talk to.  Shawntavia Saunders L. Ramonita, RN, BSN, CCM [place]  [facility], [facility] Health RN Care Manager

## 2024-04-12 ENCOUNTER — Telehealth: Payer: Self-pay

## 2024-04-14 ENCOUNTER — Other Ambulatory Visit: Payer: Self-pay

## 2024-04-14 NOTE — Patient Outreach (Signed)
 Complex Care Management   Visit Note  04/14/2024  Name:  Jessica Dunlap MRN: [id number] DOB: 1956-01-20  Situation: Referral received for Complex Care Management related to SDOH Barriers:  Home Health I obtained verbal consent from Patient.  Visit completed with Patient  on the phone  Background:   Past Medical History:  Diagnosis Date   Anemia    Cancer (HCC)    cervical   Cirrhosis, non-alcoholic (HCC)    Coronary artery disease    Depression    Diabetes mellitus without complication (HCC)    Hypertension    Hypothyroidism    Neuropathy    Tachycardia    Thyroid  disease     Assessment:  Patient reports she does not know how to clear messages on her phone. SW t/c NCLIFTS and discovered patient does not have full medicaid and is denied. SW t/c ADTS and left message to apply for In Home services. SW explains self pay services for Home Health as insurance does not cover. SW spoke to [place], the [place] aide for patients son, who agreed to provide [place] information and assist patient to determine what she can afford. SW emailed a list of Home Health providers so that patient can get a quote   SDOH Interventions    Flowsheet Row Patient Outreach Telephone from 03/29/2024 in [place] POPULATION HEALTH DEPARTMENT Telephone from 03/23/2024 in [place] POPULATION HEALTH DEPARTMENT  SDOH Interventions    Food Insecurity Interventions -- Intervention Not Indicated  Housing Interventions -- Intervention Not Indicated  Transportation Interventions Other (Comment)  [Medicaid, enrolled with [facility]] Other (Comment)  [too nervous to get on [employer]]  Utilities Interventions -- Intervention Not Indicated  Alcohol  Usage Interventions -- Intervention Not Indicated (Score <7)  Financial Strain Interventions -- Intervention Not Indicated  [owe $400 to Dr Hall's]  Stress Interventions -- Intervention Not Indicated  Social Connections Interventions -- Intervention Not Indicated  Health Literacy  Interventions -- Intervention Not Indicated    Recommendation:   None  Follow Up Plan:   Telephone follow up appointment date/time:  04/21/24 at 10am  Tillman Gardener, BSW [place]  [facility], [facility] Social Worker Direct Dial: [phone]  Fax: [phone] Website: [email or web address]

## 2024-04-14 NOTE — Patient Instructions (Signed)
 Visit Information  Thank you for taking time to visit with me today. Please don't hesitate to contact me if I can be of assistance to you before our next scheduled appointment.  Your next care management appointment is by telephone on 04/21/24 at 10am   Please call the care guide team at [phone] if you need to cancel, schedule, or reschedule an appointment.   Please call 911 if you are experiencing a Mental Health or Behavioral Health Crisis or need someone to talk to.  Tillman Gardener, BSW   [facility], [facility] Social Worker Direct Dial: [phone]  Fax: [phone] Website: [email or web address]

## 2024-04-20 ENCOUNTER — Telehealth: Payer: Self-pay

## 2024-04-20 NOTE — Patient Instructions (Signed)
 Jessica Dunlap - I am sorry I was unable to reach you today for our scheduled appointment. I work with Shona, Norleen PEDLAR, MD and am calling to support your healthcare needs. Please contact me at [phone]  at your earliest convenience. I look forward to speaking with you soon.   Thank you,  Hendricks Her RN, BSN  [place] VBCI-Population Health RN Case Manager   Direct [phone]

## 2024-04-21 ENCOUNTER — Telehealth: Payer: Self-pay

## 2024-04-21 NOTE — Patient Instructions (Signed)
 Jessica Dunlap - I am sorry I was unable to reach you today for our scheduled appointment. I work with Shona, Norleen PEDLAR, MD and am calling to support your healthcare needs. Please contact me at [phone] at your earliest convenience. I look forward to speaking with you soon.   Thank you,  Tillman Gardener, BSW [place]  [facility], [facility] Social Worker Direct Dial: [phone]  Fax: [phone] Website: [email or web address]

## 2024-04-23 ENCOUNTER — Telehealth: Payer: Self-pay

## 2024-04-23 NOTE — Patient Instructions (Signed)
 Macario LITTIE Chang - I have attempted to call you three times but have been unsuccessful in reaching you. I work with Shona, Norleen PEDLAR, MD and am calling to support your healthcare needs. If I can be of assistance to you, please contact me at [phone].     Thank you,  Hendricks Her RN, BSN  [place] VBCI-Population Health RN Case Manager   Direct [phone]

## 2024-04-26 ENCOUNTER — Telehealth: Payer: Self-pay

## 2024-05-03 ENCOUNTER — Encounter (INDEPENDENT_AMBULATORY_CARE_PROVIDER_SITE_OTHER): Payer: Self-pay | Admitting: *Deleted
